# Patient Record
Sex: Female | Born: 1975
Health system: Southern US, Community
[De-identification: ages and names within clinical notes are randomized; demographics above are authoritative.]

## PROBLEM LIST (undated history)

## (undated) ENCOUNTER — Inpatient Hospital Stay (HOSPITAL_COMMUNITY): Payer: Self-pay

## (undated) DIAGNOSIS — K5090 Crohn's disease, unspecified, without complications: Secondary | ICD-10-CM

## (undated) DIAGNOSIS — M26629 Arthralgia of temporomandibular joint, unspecified side: Secondary | ICD-10-CM

## (undated) DIAGNOSIS — N939 Abnormal uterine and vaginal bleeding, unspecified: Secondary | ICD-10-CM

## (undated) DIAGNOSIS — D563 Thalassemia minor: Secondary | ICD-10-CM

## (undated) DIAGNOSIS — N9489 Other specified conditions associated with female genital organs and menstrual cycle: Secondary | ICD-10-CM

## (undated) DIAGNOSIS — O368130 Decreased fetal movements, third trimester, not applicable or unspecified: Secondary | ICD-10-CM

## (undated) DIAGNOSIS — Z973 Presence of spectacles and contact lenses: Secondary | ICD-10-CM

## (undated) DIAGNOSIS — F411 Generalized anxiety disorder: Secondary | ICD-10-CM

## (undated) DIAGNOSIS — F419 Anxiety disorder, unspecified: Secondary | ICD-10-CM

## (undated) DIAGNOSIS — K219 Gastro-esophageal reflux disease without esophagitis: Secondary | ICD-10-CM

## (undated) DIAGNOSIS — K30 Functional dyspepsia: Secondary | ICD-10-CM

## (undated) DIAGNOSIS — A609 Anogenital herpesviral infection, unspecified: Secondary | ICD-10-CM

## (undated) DIAGNOSIS — J3501 Chronic tonsillitis: Secondary | ICD-10-CM

## (undated) HISTORY — DX: Anogenital herpesviral infection, unspecified: A60.9

## (undated) HISTORY — PX: COLON RESECTION: SHX5231

## (undated) HISTORY — PX: COLONOSCOPY WITH PROPOFOL: SHX5780

## (undated) HISTORY — PX: WISDOM TOOTH EXTRACTION: SHX21

## (undated) HISTORY — PX: APPENDECTOMY: SHX54

## (undated) HISTORY — DX: Crohn's disease, unspecified, without complications: K50.90

## (undated) SURGERY — Surgical Case
Anesthesia: *Unknown

---

## 2001-01-23 ENCOUNTER — Other Ambulatory Visit: Admission: RE | Admit: 2001-01-23 | Discharge: 2001-01-23 | Payer: Self-pay | Admitting: Obstetrics and Gynecology

## 2005-07-05 ENCOUNTER — Encounter: Payer: Self-pay | Admitting: Gastroenterology

## 2005-07-05 ENCOUNTER — Encounter: Payer: Self-pay | Admitting: Internal Medicine

## 2007-07-17 ENCOUNTER — Encounter: Payer: Self-pay | Admitting: Gastroenterology

## 2007-09-22 ENCOUNTER — Ambulatory Visit: Payer: Self-pay | Admitting: Gastroenterology

## 2007-09-22 DIAGNOSIS — K5090 Crohn's disease, unspecified, without complications: Secondary | ICD-10-CM | POA: Insufficient documentation

## 2009-12-04 ENCOUNTER — Encounter: Payer: Self-pay | Admitting: Gastroenterology

## 2010-02-15 ENCOUNTER — Ambulatory Visit: Payer: Self-pay | Admitting: Gastroenterology

## 2010-04-16 ENCOUNTER — Telehealth (INDEPENDENT_AMBULATORY_CARE_PROVIDER_SITE_OTHER): Payer: Self-pay | Admitting: *Deleted

## 2010-04-18 ENCOUNTER — Ambulatory Visit: Payer: Self-pay | Admitting: Gastroenterology

## 2010-04-25 ENCOUNTER — Encounter (INDEPENDENT_AMBULATORY_CARE_PROVIDER_SITE_OTHER): Payer: Self-pay | Admitting: *Deleted

## 2010-04-27 ENCOUNTER — Ambulatory Visit: Payer: Self-pay | Admitting: Gastroenterology

## 2010-05-09 ENCOUNTER — Encounter: Payer: Self-pay | Admitting: Gastroenterology

## 2010-05-09 ENCOUNTER — Ambulatory Visit
Admission: RE | Admit: 2010-05-09 | Discharge: 2010-05-09 | Payer: Self-pay | Source: Home / Self Care | Attending: Gastroenterology | Admitting: Gastroenterology

## 2010-05-15 ENCOUNTER — Encounter: Payer: Self-pay | Admitting: Gastroenterology

## 2010-05-31 NOTE — Procedures (Addendum)
Summary: Colonoscopy  Patient: [name] Note: All result statuses are Final unless otherwise noted.  Tests: (1) Colonoscopy (COL)   COL Colonoscopy           [facility].     [place], [place]  [address]           COLONOSCOPY PROCEDURE REPORT           PATIENT:  Susan Montoya, Susan Montoya  MR#:  [id number]     BIRTHDATE:  [birth year], 34 yrs. old  GENDER:  female           ENDOSCOPIST:  Sandy Salaam. Deatra Ina, MD     Referred by:           PROCEDURE DATE:  05/09/2010     PROCEDURE:  Colonoscopy with biopsy     ASA CLASS:  Class II     INDICATIONS:  1) Crohn's disease           MEDICATIONS:   MAC sedation, administered by CRNA           DESCRIPTION OF PROCEDURE:   After the risks benefits and     alternatives of the procedure were thoroughly explained, informed     consent was obtained.  Digital rectal exam was performed and     revealed no abnormalities.   The LB CF-H180AL [id number] endoscope     was introduced through the anus and advanced to the cecum, which     was identified by the ileocecal valve, without limitations.  The     quality of the prep was excellent, using MoviPrep.  The instrument     was then slowly withdrawn as the colon was fully examined.     <<PROCEDUREIMAGES>>           FINDINGS:  Colitis was found in the rectum. Very mild erythema     with areas of submucosal hemmorhage. Bxs taken (see image9 and     image10).  This was otherwise a normal examination of the colon     (see image1, image2, image3, image4, image5, image7, and image8).     Retroflexed views in the rectum revealed no abnormalities.    The     time to cecum =  3.75  minutes. The scope was then withdrawn (time     =  4.75  min) from the patient and the procedure completed.           COMPLICATIONS:  None           ENDOSCOPIC IMPRESSION:     1) Colitis in the rectum (minimally active disease)     2) Otherwise normal examination     RECOMMENDATIONS:     1) Continue current  medications           REPEAT EXAM:  No           ______________________________     Sandy Salaam. Deatra Ina, MD           CC:           n.     eSIGNED:   Sandy Salaam. Rachid Parham at 05/09/2010 02:30 PM           Jossie Ng, [id number]  Note: An exclamation mark (!) indicates a result that was not dispersed into the flowsheet. Document Creation Date: 05/09/2010 2:30 PM _______________________________________________________________________  (1) Order result status: Final Collection or observation date-time: 05/09/2010 14:21 Requested date-time:  Receipt  date-time:  Reported date-time:  Referring Physician:   Ordering Physician: Erskine Emery [phone]) Specimen Source:  Source: Tawanna Cooler Order Number: [id number] Lab site:

## 2010-05-31 NOTE — Progress Notes (Signed)
Summary: Wonders if she should change colon 12-21  Phone Note Call from Patient Call back at [facility] Phone [phone]   Call For: Dr Deatra Ina Reason for Call: Talk to Nurse Summary of Call: Over the weekend ate peas & brown rice and is pretty sure she should change her procedure date - 04-18-10 Initial call taken by: Irwin Brakeman [facility],  April 16, 2010 8:15 AM  Follow-up for Phone Call        Diet restrictions reviewed with patient again. Pt okay to have procedure as planned Follow-up by: Ulice Dash RN,  April 16, 2010 10:11 AM

## 2010-05-31 NOTE — Letter (Signed)
Summary: [facility] Instructions  [facility]  [place], [place] [address]   Phone: [phone]  Fax: [phone]       Susan Montoya    [birth year]    MRN: [id number]        Procedure Day Sudie Grumbling:  Wednesday 05/09/2010     Arrival Time:  1:00 pm      Procedure Time:  2:00 pm     Location of Procedure:                    _x _  [facility] (4th Floor)                        [place]   Starting 5 days prior to your procedure Friday 1/6 do not eat nuts, seeds, popcorn, corn, beans, peas,  salads, or any raw vegetables.  Do not take any fiber supplements (e.g. Metamucil, Citrucel, and Benefiber).  THE DAY BEFORE YOUR PROCEDURE         DATE: Tuesday 1/10  1.  Drink clear liquids the entire day-NO SOLID FOOD  2.  Do not drink anything colored red or purple.  Avoid juices with pulp.  No orange juice.  3.  Drink at least 64 oz. (8 glasses) of fluid/clear liquids during the day to prevent dehydration and help the prep work efficiently.  CLEAR LIQUIDS INCLUDE: Water Jello Ice Popsicles Tea (sugar ok, no milk/cream) Powdered fruit flavored drinks Coffee (sugar ok, no milk/cream) Gatorade Juice: apple, white grape, white cranberry  Lemonade Clear bullion, consomm, broth Carbonated beverages (any kind) Strained chicken noodle soup Hard Candy                             4.  In the morning, mix first dose of MoviPrep solution:    Empty 1 Pouch A and 1 Pouch B into the disposable container    Add lukewarm drinking water to the top line of the container. Mix to dissolve    Refrigerate (mixed solution should be used within 24 hrs)  5.  Begin drinking the prep at 5:00 p.m. The MoviPrep container is divided by 4 marks.   Every 15 minutes drink the solution down to the next mark (approximately 8 oz) until the full liter is complete.   6.  Follow completed prep with 16 oz of clear liquid of your choice (Nothing  red or purple).  Continue to drink clear liquids until bedtime.  7.  Before going to bed, mix second dose of MoviPrep solution:    Empty 1 Pouch A and 1 Pouch B into the disposable container    Add lukewarm drinking water to the top line of the container. Mix to dissolve    Refrigerate  THE DAY OF YOUR PROCEDURE      DATE: Wednesday 1/11  Beginning at 9:00 a.m. (5 hours before procedure):         1. Every 15 minutes, drink the solution down to the next mark (approx 8 oz) until the full liter is complete.  2. Follow completed prep with 16 oz. of clear liquid of your choice.    3. You may drink clear liquids until 12:00 pm  (2 HOURS BEFORE PROCEDURE).   MEDICATION INSTRUCTIONS  Unless otherwise instructed, you should take regular prescription medications with a small sip of water   as early as possible  the morning of your procedure.           OTHER INSTRUCTIONS  You will need a responsible adult at least 35 years of age to accompany you and drive you home.   This person must remain in the waiting room during your procedure.  Wear loose fitting clothing that is easily removed.  Leave jewelry and other valuables at home.  However, you may wish to bring a book to read or  an iPod/MP3 player to listen to music as you wait for your procedure to start.  Remove all body piercing jewelry and leave at home.  Total time from sign-in until discharge is approximately 2-3 hours.  You should go home directly after your procedure and rest.  You can resume normal activities the  day after your procedure.  The day of your procedure you should not:   Drive   Make legal decisions   Operate machinery   Drink alcohol   Return to work  You will receive specific instructions about eating, activities and medications before you leave.    The above instructions have been reviewed and explained to me by   Ulice Dash RN  April 26, 2010 10:07 AM     I fully understand and  can verbalize these instructions _____________________________ Date _________

## 2010-05-31 NOTE — Procedures (Signed)
Summary: Colonoscopy  Patient: Susan Montoya Note: All result statuses are Final unless otherwise noted.  Tests: (1) Colonoscopy (COL)   COL Colonoscopy           [facility].     [place], [place]  [address]           COLONOSCOPY PROCEDURE REPORT           PATIENT:  Susan Montoya, Susan Montoya  MR#:  [id number]     BIRTHDATE:  03-Dec-1975, 34 yrs. old  GENDER:  female           ENDOSCOPIST:  Sandy Salaam. Deatra Ina, MD     Referred by:           PROCEDURE DATE:  04/18/2010     PROCEDURE:  Incomplete colonoscopy     ASA CLASS:  Class II     INDICATIONS:  1) Crohn's disease           MEDICATIONS:   Benadryl 37.5 mg IV, Versed 8 mg IV, Fentanyl 100     mcg IV           DESCRIPTION OF PROCEDURE:   After the risks benefits and     alternatives of the procedure were thoroughly explained, informed     consent was obtained.  Digital rectal exam was performed and     revealed no abnormalities.   The LB PCF-H180AL [id number] endoscope     was introduced through the anus and advanced to the descending     colon, limited by extreme patient discomfort.    The quality of     the prep was excellent, using MoviPrep.  The instrument was then     slowly withdrawn as the colon was fully examined.           FINDINGS:  Normal left colon    Retroflexed views in the rectum     revealed no abnormalities.     The scope was then withdrawn  from     the patient and the procedure completed.           COMPLICATIONS:  None           ENDOSCOPIC IMPRESSION:     1) Normal     RECOMMENDATIONS:     1) Sedation with MAC for future procedures     2) RepeatColonoscopy           ______________________________     Sandy Salaam. Deatra Ina, MD           CC:           n.     eSIGNED:   Sandy Salaam. Kaplan at 04/18/2010 08:34 AM           Jossie Ng, [id number]  Note: An exclamation mark (!) indicates a result that was not dispersed into the flowsheet. Document Creation Date: 04/18/2010 8:34  AM _______________________________________________________________________  (1) Order result status: Final Collection or observation date-time: 04/18/2010 08:24 Requested date-time:  Receipt date-time:  Reported date-time:  Referring Physician:   Ordering Physician: Erskine Emery [phone]) Specimen Source:  Source: Tawanna Cooler Order Number: [id number] Lab site:   Appended Document: Colonoscopy PREVIST SCHEDULED FOR 12/30 AT 1:30PM AND REPEAT COLON W/PROPOFUL ON 1/11 AT 2PM

## 2010-05-31 NOTE — Letter (Signed)
Summary: Results Letter  [facility]  [place], [place] [address]   Phone: [phone]  Fax: [phone]        May 15, 2010 MRN: [id number]    [place], [place]  [address]    Dear Ms. Nori Riis,  Your colon biopsy results did not show any remarkable findings.  Please continue with the recommendations previously discussed.  Should you have any further questions or immediate concers, feel free to contact me.  Sincerely,  Sandy Salaam. Deatra Ina, M.D., [facility]         Sincerely,  Inda Castle MD  This letter has been electronically signed by your physician.  Appended Document: Results Letter Letter mailed

## 2010-05-31 NOTE — Letter (Signed)
Summary: [facility]   Imported By: Phillis Knack 03/06/2010 07:40:45  _____________________________________________________________________  External Attachment:    Type:   Image     Comment:   External Document

## 2010-05-31 NOTE — Assessment & Plan Note (Signed)
Summary: blood in stool//discuss COL--ch.   History of Present Illness Visit Type: Follow-up Visit Primary GI MD: Erskine Emery MD [facility] Primary Provider: n/a Requesting Provider: n/a Chief Complaint: Hx of Crohn's and recent blood in stool associated with a virus History of Present Illness:   Ms Susan Montoya has returned to setup colonoscopy.  She is status post right hemicolectomy and resection of the terminal ileum for Crohn's disease, in 2007.  She has been symptom-free.    Altogether she is feeling well.  Colonoscopy was recommended at her last visit to see whether she has any active disease.   GI Review of Systems      Denies abdominal pain, acid reflux, belching, bloating, chest pain, dysphagia with liquids, dysphagia with solids, heartburn, loss of appetite, nausea, vomiting, vomiting blood, weight loss, and  weight gain.        Denies anal fissure, black tarry stools, change in bowel habit, constipation, diarrhea, diverticulosis, fecal incontinence, heme positive stool, hemorrhoids, irritable bowel syndrome, jaundice, light color stool, liver problems, rectal bleeding, and  rectal pain. Preventive Screening-Counseling & Management  Alcohol-Tobacco     Smoking Status: never  Caffeine-Diet-Exercise     Does Patient Exercise: yes      Drug Use:  no.      Current Medications (verified): 1)  Loestrin Fe 1.5/30 1.5-30 Mg-Mcg  Tabs (Norethin Ace-Eth Estrad-Fe) .... As Directed 2)  Alprazolam 0.5 Mg Tabs (Alprazolam) .... Use As Needed  Allergies (verified): No Known Drug Allergies  Past History:  Past Medical History: CROHN'S DISEASE  Past Surgical History: Reviewed history from 09/22/2007 and no changes required. Appendectomy 7/07 Six inches of small and large intestine removed 7/07  Family History: Family history is negative for inflammatory bowel disease. 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  Alcohol Use - yes--occ Illicit Drug Use - no Patient gets regular exercise. Run 15-20 miles/week Smoking Status:  never Drug Use:  no Does Patient Exercise:  yes  Review of Systems  The patient denies allergy/sinus, anemia, anxiety-new, arthritis/joint pain, back pain, blood in urine, breast changes/lumps, confusion, cough, coughing up blood, depression-new, fainting, fatigue, fever, headaches-new, hearing problems, heart murmur, heart rhythm changes, itching, menstrual pain, muscle pains/cramps, night sweats, nosebleeds, pregnancy symptoms, shortness of breath, skin rash, sleeping problems, sore throat, swelling of feet/legs, swollen lymph glands, thirst - excessive, urination - excessive, urination changes/pain, urine leakage, vision changes, and voice change.    Vital Signs:  Patient profile:   35 year old female Height:      63 inches Weight:      113 pounds BMI:     20.09 Pulse rate:   52 / minute Pulse rhythm:   regular BP sitting:   110 / 72  (left arm) Cuff size:   regular  Vitals Entered By: Abelino Derrick CMA Deborra Medina) (February 15, 2010 8:44 AM)  Physical Exam  Additional Exam:  On physical exam she is a well-developed large female  skin: anicteric HEENT: normocephalic; PEERLA; no nasal or pharyngeal abnormalities neck: supple nodes: no cervical lymphadenopathy chest: clear to ausculatation and percussion heart: no murmurs, gallops, or rubs abd: soft, nontender; BS normoactive; no abdominal masses, tenderness, organomegaly rectal: deferred ext: no cynanosis, clubbing, edema skeletal: no deformities neuro: oriented x 3; no focal abnormalities    Impression & Recommendations:  Problem # 1:  CROHN'S DISEASE (ICD-555.9)  status post right hemicolectomy and resection of the terminal ileum in 2007.  Currently asymptomatic.  Recommendations #1  colonoscopy  Orders: Colonoscopy (Colon)  Patient Instructions: 1)  Colonoscopy and Flexible Sigmoidoscopy brochure given.  2)  Conscious Sedation brochure given.  3)  Your Colonoscopy is scheduled on 04/18/2010 at 8am 4)  The medication list was reviewed and reconciled.  All changed / newly prescribed medications were explained.  A complete medication list was provided to the patient / caregiver. Prescriptions: MOVIPREP 100 GM  SOLR (PEG-KCL-NACL-NASULF-NA ASC-C) As per prep instructions.  #1 x 0   Entered by:   Genella Mech CMA (AAMA)   Authorized by:   Inda Castle MD   Signed by:   Genella Mech CMA ([place]) on 02/15/2010   Method used:   Print then Give to Patient   RxID:   [id number] MOVIPREP 100 GM  SOLR (PEG-KCL-NACL-NASULF-NA ASC-C) As per prep instructions.  #1 x 0   Entered by:   Genella Mech CMA ([place])   Authorized by:   Inda Castle MD   Signed by:   Genella Mech CMA ([place]) on 02/15/2010   Method used:   Electronically to        [facility] # [phone]* (retail)       [address]       [place], [place]  [address]       Ph: [phone]       Fax: [phone]   RxID:   [id number]  [address] [place]

## 2010-05-31 NOTE — Letter (Signed)
Summary: [facility] Instructions  [facility]  [place], [place] [address]   Phone: [phone]  Fax: [phone]       Susan Montoya    [birth year]    MRN: [id number]        Procedure Day /Date:WEDNESDAY 04/18/2010     Arrival Time:7:30AM     Procedure Time:8:00AM     Location of Procedure:                    X  [facility] (4th Floor)   PREPARATION FOR COLONOSCOPY WITH MOVIPREP   Starting 5 days prior to your procedure 04/13/2010 do not eat nuts, seeds, popcorn, corn, beans, peas,  salads, or any raw vegetables.  Do not take any fiber supplements (e.g. Metamucil, Citrucel, and Benefiber).  THE DAY BEFORE YOUR PROCEDURE         DATE:04/17/2010  DAY: TUESDAY  1.  Drink clear liquids the entire day-NO SOLID FOOD  2.  Do not drink anything colored red or purple.  Avoid juices with pulp.  No orange juice.  3.  Drink at least 64 oz. (8 glasses) of fluid/clear liquids during the day to prevent dehydration and help the prep work efficiently.  CLEAR LIQUIDS INCLUDE: Water Jello Ice Popsicles Tea (sugar ok, no milk/cream) Powdered fruit flavored drinks Coffee (sugar ok, no milk/cream) Gatorade Juice: apple, white grape, white cranberry  Lemonade Clear bullion, consomm, broth Carbonated beverages (any kind) Strained chicken noodle soup Hard Candy                             4.  In the morning, mix first dose of MoviPrep solution:    Empty 1 Pouch A and 1 Pouch B into the disposable container    Add lukewarm drinking water to the top line of the container. Mix to dissolve    Refrigerate (mixed solution should be used within 24 hrs)  5.  Begin drinking the prep at 5:00 p.m. The MoviPrep container is divided by 4 marks.   Every 15 minutes drink the solution down to the next mark (approximately 8 oz) until the full liter is complete.   6.  Follow completed prep with 16 oz of clear liquid of your choice (Nothing red or purple).   Continue to drink clear liquids until bedtime.  7.  Before going to bed, mix second dose of MoviPrep solution:    Empty 1 Pouch A and 1 Pouch B into the disposable container    Add lukewarm drinking water to the top line of the container. Mix to dissolve    Refrigerate  THE DAY OF YOUR PROCEDURE      DATE:04/18/2010 DAY: WEDNESDAY  Beginning at 3 a.m. (5 hours before procedure):         1. Every 15 minutes, drink the solution down to the next mark (approx 8 oz) until the full liter is complete.  2. Follow completed prep with 16 oz. of clear liquid of your choice.    3. You may drink clear liquids until 6AM (2 HOURS BEFORE PROCEDURE).   MEDICATION INSTRUCTIONS  Unless otherwise instructed, you should take regular prescription medications with a small sip of water   as early as possible the morning of your procedure.          OTHER INSTRUCTIONS  You will need a responsible adult at least 35 years of age to accompany you and  drive you home.   This person must remain in the waiting room during your procedure.  Wear loose fitting clothing that is easily removed.  Leave jewelry and other valuables at home.  However, you may wish to bring a book to read or  an iPod/MP3 player to listen to music as you wait for your procedure to start.  Remove all body piercing jewelry and leave at home.  Total time from sign-in until discharge is approximately 2-3 hours.  You should go home directly after your procedure and rest.  You can resume normal activities the  day after your procedure.  The day of your procedure you should not:   Drive   Make legal decisions   Operate machinery   Drink alcohol   Return to work  You will receive specific instructions about eating, activities and medications before you leave.    The above instructions have been reviewed and explained to me by   _______________________    I fully understand and can verbalize these instructions  _____________________________ Date _________

## 2010-05-31 NOTE — Miscellaneous (Signed)
Summary: [facility]  Clinical Lists Changes  Medications: Added new medication of MOVIPREP 100 GM  SOLR (PEG-KCL-NACL-NASULF-NA ASC-C) As per prep instructions. - Signed Rx of MOVIPREP 100 GM  SOLR (PEG-KCL-NACL-NASULF-NA ASC-C) As per prep instructions.;  #1 x 0;  Signed;  Entered by: Ulice Dash RN;  Authorized by: Inda Castle MD;  Method used: Electronically to [facility]  [phone]*, [address] [place], [place]  [address], Ph: [phone] or [phone], Fax: [phone] Observations: Added new observation of NKA: T (04/26/2010 9:48)    Prescriptions: MOVIPREP 100 GM  SOLR (PEG-KCL-NACL-NASULF-NA ASC-C) As per prep instructions.  #1 x 0   Entered by:   Ulice Dash RN   Authorized by:   Inda Castle MD   Signed by:   Ulice Dash RN on 04/26/2010   Method used:   Electronically to        [facility]  [phone]* (retail)       [place], [place]  [address]       Ph: [phone] or [phone]       Fax: [phone]   RxID:   [id number]

## 2010-07-29 ENCOUNTER — Emergency Department (HOSPITAL_COMMUNITY)
Admission: EM | Admit: 2010-07-29 | Discharge: 2010-07-29 | Disposition: A | Discharge status: Home / Self Care | Payer: No Typology Code available for payment source | Attending: Emergency Medicine | Admitting: Emergency Medicine

## 2010-07-29 DIAGNOSIS — M25519 Pain in unspecified shoulder: Secondary | ICD-10-CM | POA: Insufficient documentation

## 2010-07-29 DIAGNOSIS — T148XXA Other injury of unspecified body region, initial encounter: Secondary | ICD-10-CM | POA: Insufficient documentation

## 2010-07-29 DIAGNOSIS — M549 Dorsalgia, unspecified: Secondary | ICD-10-CM | POA: Insufficient documentation

## 2011-08-20 ENCOUNTER — Ambulatory Visit (INDEPENDENT_AMBULATORY_CARE_PROVIDER_SITE_OTHER): Payer: No Typology Code available for payment source | Admitting: Family

## 2011-08-20 ENCOUNTER — Encounter: Payer: Self-pay | Admitting: Family

## 2011-08-20 VITALS — BP 100/70 | Ht 63.0 in | Wt 124.0 lb

## 2011-08-20 DIAGNOSIS — K5090 Crohn's disease, unspecified, without complications: Secondary | ICD-10-CM

## 2011-08-20 DIAGNOSIS — D569 Thalassemia, unspecified: Secondary | ICD-10-CM

## 2011-08-20 DIAGNOSIS — Z111 Encounter for screening for respiratory tuberculosis: Secondary | ICD-10-CM

## 2011-08-20 NOTE — Progress Notes (Signed)
  Subjective:    Patient ID: Susan Montoya, female    DOB: [birth year], 36 y.o.   MRN: [id number]  HPI 36 year old female, new patient to the practice here to be established. She has a history of thalassemia and Crohn's disease. They have both well managed and under control. Denies any concerns today. She is requiring a PPD for a new job. Denies any lightheadedness, dizziness, chest pain, palpitations, shortness of breath or edema.  Patient has a family history of breast cancer her mother, recently diagnosed at age 31. And a paternal grandmother of unknown age.   Review of Systems  Constitutional: Negative.   Respiratory: Negative.   Cardiovascular: Negative.   Gastrointestinal: Negative.   Genitourinary: Negative.   Musculoskeletal: Negative.   Skin: Negative.   Neurological: Negative.   Hematological: Negative.   Psychiatric/Behavioral: Negative.    Past Medical History  Diagnosis Date  . Crohn's disease     History   Social History  . Marital Status: Married    Spouse Name: N/A    Number of Children: N/A  . Years of Education: N/A   Occupational History  . Not on file.   Social History Main Topics  . Smoking status: Never Smoker   . Smokeless tobacco: Not on file  . Alcohol Use: Yes  . Drug Use: No  . Sexually Active:    Other Topics Concern  . Not on file   Social History Narrative  . No narrative on file    Past Surgical History  Procedure Date  . Appendectomy   . Small intestine surgery     1 ft removed    Family History  Problem Relation Age of Onset  . Cancer Mother     breast  . Hyperlipidemia Mother   . Hypertension Mother   . Hypertension Father   . Hypertension Brother   . Hypertension Maternal Grandmother   . Hyperlipidemia Maternal Grandfather   . Hypertension Maternal Grandfather   . Diabetes Paternal Grandmother   . Cancer Paternal Grandmother     breast  . Diabetes Paternal Grandfather     No Known Allergies  Current Outpatient  Prescriptions on File Prior to Visit  Medication Sig Dispense Refill  . norethindrone-ethinyl estradiol (JUNEL FE,GILDESS FE,LOESTRIN FE) 1-20 MG-MCG tablet Take 1 tablet by mouth daily.        BP 100/70  Ht 5' 3"  (1.6 m)  Wt 124 lb (56.246 kg)  BMI 21.97 kg/m2chart     Objective:   Physical Exam  Constitutional: She is oriented to person, place, and time. She appears well-developed and well-nourished.  HENT:  Right Ear: External ear normal.  Left Ear: External ear normal.  Nose: Nose normal.  Mouth/Throat: Oropharynx is clear and moist.  Neck: Normal range of motion. Neck supple.  Cardiovascular: Normal rate and normal heart sounds.   Pulmonary/Chest: Effort normal.  Abdominal: Soft. Bowel sounds are normal.  Musculoskeletal: Normal range of motion.  Neurological: She is alert and oriented to person, place, and time.  Skin: Skin is dry.  Psychiatric: She has a normal mood and affect.          Assessment & Plan:  Assessment: Need for PPD, thalassemia, Crohn's disease  Plan: Mammogram ordered. She is currently seeing gynecology for fasting blood work and gynecological care. PPD applied. Patient will return in 48-72 hours to have a red.

## 2011-08-22 LAB — TB SKIN TEST: TB Skin Test: NEGATIVE mm

## 2011-09-18 ENCOUNTER — Other Ambulatory Visit: Payer: Self-pay

## 2011-09-18 DIAGNOSIS — Z1231 Encounter for screening mammogram for malignant neoplasm of breast: Secondary | ICD-10-CM

## 2011-10-01 ENCOUNTER — Ambulatory Visit
Admission: RE | Admit: 2011-10-01 | Discharge: 2011-10-01 | Disposition: A | Discharge status: Home / Self Care | Payer: No Typology Code available for payment source | Source: Ambulatory Visit | Attending: Family | Admitting: Family

## 2011-10-01 DIAGNOSIS — Z1231 Encounter for screening mammogram for malignant neoplasm of breast: Secondary | ICD-10-CM

## 2011-11-18 LAB — OB RESULTS CONSOLE ANTIBODY SCREEN: Antibody Screen: NEGATIVE

## 2011-11-18 LAB — OB RESULTS CONSOLE HIV ANTIBODY (ROUTINE TESTING): HIV: NONREACTIVE

## 2011-11-18 LAB — OB RESULTS CONSOLE GC/CHLAMYDIA: Gonorrhea: NEGATIVE

## 2012-03-01 ENCOUNTER — Inpatient Hospital Stay (HOSPITAL_COMMUNITY)
Admission: AD | Admit: 2012-03-01 | Discharge: 2012-03-05 | DRG: 781 | Disposition: A | Discharge status: Home / Self Care | Payer: 59 | Source: Ambulatory Visit | Attending: Obstetrics | Admitting: Obstetrics

## 2012-03-01 ENCOUNTER — Inpatient Hospital Stay (HOSPITAL_COMMUNITY): Payer: 59

## 2012-03-01 ENCOUNTER — Encounter (HOSPITAL_COMMUNITY): Payer: Self-pay | Admitting: *Deleted

## 2012-03-01 DIAGNOSIS — O99891 Other specified diseases and conditions complicating pregnancy: Secondary | ICD-10-CM | POA: Diagnosis present

## 2012-03-01 DIAGNOSIS — O4690 Antepartum hemorrhage, unspecified, unspecified trimester: Principal | ICD-10-CM | POA: Diagnosis present

## 2012-03-01 HISTORY — DX: Thalassemia minor: D56.3

## 2012-03-01 LAB — CBC
HCT: 28 % — ABNORMAL LOW (ref 36.0–46.0)
Hemoglobin: 9 g/dL — ABNORMAL LOW (ref 12.0–15.0)
MCH: 21.1 pg — ABNORMAL LOW (ref 26.0–34.0)
MCHC: 32.1 g/dL (ref 30.0–36.0)
MCV: 65.6 fL — ABNORMAL LOW (ref 78.0–100.0)
Platelets: 254 K/uL (ref 150–400)
RBC: 4.27 MIL/uL (ref 3.87–5.11)
RDW: 15.5 % (ref 11.5–15.5)
WBC: 12.4 K/uL — ABNORMAL HIGH (ref 4.0–10.5)

## 2012-03-01 LAB — URINALYSIS, ROUTINE W REFLEX MICROSCOPIC
Bilirubin Urine: NEGATIVE
Ketones, ur: NEGATIVE mg/dL
Nitrite: NEGATIVE
Protein, ur: NEGATIVE mg/dL
Specific Gravity, Urine: <1.005 — ABNORMAL LOW (ref 1.005–1.030)
Urobilinogen, UA: 0.2 mg/dL (ref 0.0–1.0)

## 2012-03-01 LAB — URINE MICROSCOPIC-ADD ON

## 2012-03-01 LAB — ABO/RH: ABO/RH(D): O POS

## 2012-03-01 MED ORDER — BETAMETHASONE SOD PHOS & ACET 6 (3-3) MG/ML IJ SUSP
12.0000 mg | INTRAMUSCULAR | Status: AC
  Administered 2012-03-01 – 2012-03-02 (×2): 12 mg via INTRAMUSCULAR
  Filled 2012-03-01 (×2): qty 2

## 2012-03-01 MED ORDER — DOCUSATE SODIUM 100 MG PO CAPS
100.0000 mg | ORAL_CAPSULE | Freq: Every day | ORAL | Status: DC
  Administered 2012-03-03: 100 mg via ORAL
  Filled 2012-03-01 (×3): qty 1

## 2012-03-01 MED ORDER — CALCIUM CARBONATE ANTACID 500 MG PO CHEW
2.0000 | CHEWABLE_TABLET | ORAL | Status: DC | PRN
  Administered 2012-03-01: 400 mg via ORAL

## 2012-03-01 MED ORDER — SODIUM CHLORIDE 0.9 % IJ SOLN
3.0000 mL | Freq: Two times a day (BID) | INTRAMUSCULAR | Status: DC
  Administered 2012-03-03 – 2012-03-04 (×4): 3 mL via INTRAVENOUS

## 2012-03-01 MED ORDER — PANTOPRAZOLE SODIUM 40 MG PO TBEC
40.0000 mg | DELAYED_RELEASE_TABLET | Freq: Every day | ORAL | Status: DC
  Administered 2012-03-01 – 2012-03-05 (×5): 40 mg via ORAL
  Filled 2012-03-01 (×6): qty 1

## 2012-03-01 MED ORDER — SODIUM CHLORIDE 0.9 % IJ SOLN: 3.0000 mL | INTRAMUSCULAR | Status: DC | PRN

## 2012-03-01 MED ORDER — ZOLPIDEM TARTRATE 5 MG PO TABS: 5.0000 mg | ORAL_TABLET | Freq: Every evening | ORAL | Status: DC | PRN

## 2012-03-01 MED ORDER — CALCIUM CARBONATE ANTACID 500 MG PO CHEW
2.0000 | CHEWABLE_TABLET | ORAL | Status: DC | PRN
  Filled 2012-03-01 (×2): qty 2

## 2012-03-01 MED ORDER — AZITHROMYCIN 500 MG PO TABS
500.0000 mg | ORAL_TABLET | Freq: Every day | ORAL | Status: DC
  Administered 2012-03-01 – 2012-03-05 (×5): 500 mg via ORAL
  Filled 2012-03-01 (×6): qty 1

## 2012-03-01 MED ORDER — RISAQUAD PO CAPS
1.0000 | ORAL_CAPSULE | Freq: Every day | ORAL | Status: DC
Start: 2012-03-01 — End: 2012-03-05
  Administered 2012-03-01 – 2012-03-05 (×5): 1 via ORAL
  Filled 2012-03-01 (×6): qty 1

## 2012-03-01 MED ORDER — PRENATAL MULTIVITAMIN CH: 1.0000 | ORAL_TABLET | Freq: Every day | ORAL | Status: DC

## 2012-03-01 MED ORDER — SODIUM CHLORIDE 0.9 % IV SOLN: 250.0000 mL | INTRAVENOUS | Status: DC | PRN

## 2012-03-01 MED ORDER — AMPICILLIN SODIUM 2 G IJ SOLR
2.0000 g | Freq: Four times a day (QID) | INTRAMUSCULAR | Status: AC
  Administered 2012-03-01 – 2012-03-03 (×8): 2 g via INTRAVENOUS
  Filled 2012-03-01 (×8): qty 2000

## 2012-03-01 MED ORDER — AMOXICILLIN 500 MG PO CAPS
500.0000 mg | ORAL_CAPSULE | Freq: Three times a day (TID) | ORAL | Status: DC
  Administered 2012-03-03 – 2012-03-05 (×7): 500 mg via ORAL
  Filled 2012-03-01 (×10): qty 1

## 2012-03-01 MED ORDER — PRENATAL MULTIVITAMIN CH
1.0000 | ORAL_TABLET | Freq: Every day | ORAL | Status: DC
  Administered 2012-03-03 – 2012-03-05 (×3): 1 via ORAL
  Filled 2012-03-01 (×5): qty 1

## 2012-03-01 MED ORDER — ACETAMINOPHEN 325 MG PO TABS: 650.0000 mg | ORAL_TABLET | ORAL | Status: DC | PRN

## 2012-03-01 NOTE — MAU Provider Note (Signed)
History     CSN: [id number]  Arrival date and time: 03/01/12 4287    Provider notified at [facility] Patient seen at [facility]  Patient presents with  . Rupture of Membranes  . Vaginal Bleeding   HPI Susan Montoya is a 36 y.o. G2P1001 at [redacted]w[redacted]d with good PNC at [facility], Dr, [facility]. Reports episode of intercourse at 5 am today, small spotting noted afterwards. An hour later while driving to work she experienced a gush of bloody fluid which soaked through her clothes and onto seat of car.  Denies contractions, good fetal movement, denies dysuria / pelvic pain / frequency.  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 Crohn's disease     Past Surgical History  Procedure Date  . Appendectomy   . Small intestine surgery     1 ft removed    Family History  Problem Relation Age of Onset  . Cancer Mother     breast  . Hyperlipidemia Mother   . Hypertension Mother   . Hypertension Father   . Hypertension Brother   . Hypertension Maternal Grandmother   . Hyperlipidemia Maternal Grandfather   . Hypertension Maternal Grandfather   . Diabetes Paternal Grandmother   . Cancer Paternal Grandmother     breast  . Diabetes Paternal Grandfather     History  Substance Use Topics  . Smoking status: Never Smoker   . Smokeless tobacco: Not on file  . Alcohol Use: Yes    Allergies: No Known Allergies  Prescriptions prior to admission  Medication Sig Dispense Refill  . ALPRAZolam (XANAX) 0.5 MG tablet Take 0.5 mg by mouth as needed.      . Multiple Vitamin (MULTIVITAMIN) tablet Take 1 tablet by mouth daily.      . norethindrone-ethinyl estradiol (JUNEL FE,GILDESS FE,LOESTRIN FE) 1-20 MG-MCG tablet Take 1 tablet by mouth daily.        Review of Systems    Gastrointestinal: Negative for abdominal pain.  All other systems reviewed and are negative.   Physical Exam   Blood pressure 120/66, pulse 93, temperature 97.8 F (36.6 C), resp. rate 20, height 5' 3"  (1.6 m), weight 60.963 kg (134 lb 6.4 oz), last menstrual period 09/25/2011.  Physical Exam  Constitutional: She is oriented to person, place, and time. She appears well-developed and well-nourished. No distress.  HENT:  Head: Normocephalic.  Eyes: Pupils are equal, round, and reactive to light.  Neck: Normal range of motion.  Cardiovascular: Normal rate.   Respiratory: Effort normal.  GI: Soft. She exhibits no distension. There is no tenderness. There is no rebound.  Genitourinary: Cervix exhibits no discharge (no discharge elicited with cough). There is bleeding around the vagina. No signs of injury around the vagina.       No lacerations along vaginal walls. Bloody fluid moderate amount in vaginal canal removed with fox swabs. Quail egg sized clot removed.  Cervix appears intact, slightly open, + ectropion, no fetal membranes / polyp visible. Digital exam deferred  Musculoskeletal: Normal range of motion. She exhibits no edema.  Neurological: She is alert and oriented to person, place, and time.  Skin: Skin is warm.  Psychiatric: She has a normal mood and  affect.   EFM: FHR 145, no decel's, moderate variability, appropriate for gestational age           31: no ctx noted MAU Course  Procedures  MDM OB sono   Assessment and Plan  IUP at 23 1/7 wks Vaginal bleeding and ? PPROM   OB sono for placenta location / presentation / AFI / cervix cath UA Dr. Pamala Hurry updated w/ findings and POC. MD will follow.   Susan Montoya 03/01/2012, 7:52 AM

## 2012-03-01 NOTE — MAU Note (Signed)
Pt presents to MAU with CC of bleeding and possible rupture of membranes. Pt is a G2P1 at 43w1dsays this morning at 0550 she had intercourse and following noticed spotting. Pt was on her way to work at [employer] had a gush of bloody/watery fluid that soaked through her pants.

## 2012-03-01 NOTE — Consult Note (Signed)
Neonatology Consult to Antenatal Patient:  Susan Montoya is admitted today at 23 1/[redacted] weeks GA after SROM at home. She is currently not having active labor. She is getting BMZ and IV Ampicillin and Zithromax. 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 There was thickened nuchal translucency on ultrasound, but a normal AFP and harmony screening.  I spoke with the patient alone part of the time, then the father of the baby joined [place]. We discussed the worst case of delivery in the next 1-2 days, including usual DR management, possible respiratory complications and need for support, IV access, LOS, Mortality and Morbidity, and long term outcomes. I explained why 23 weeks is the limit of viability and variable response to resuscitative efforts may be seen between 23-25 weeks.   Because of the very poor long term neurodevelopmental outcomes at [redacted] weeks GA, I let the patient know that she and Dr. Valentino Saxon could decide whether or not to ask the resuscitation team to attend if delivery were necessary, especially in the next few days. She expressed that she wants full resuscitative measures done at this time. She did not have any questions. I offered a NICU tour to any interested family members and would be glad to come back if she has more questions later.  Thank you for asking me to see this patient.  Caleb Popp, MD Neonatologist  Time spent: 25 minutes

## 2012-03-01 NOTE — MAU Note (Signed)
Had intercourse about 0550 and then saw spotting. Was on way to work at [employer] and had gush of bloody fld. NO pain

## 2012-03-01 NOTE — H&P (Signed)
Susan Montoya is a 36 y.o. G2P1001 at 89w1dby early ultrasound and LMP dating who notes large gush of bloody fluid early this a.m. Patient notes having a normal day yesterday and a normal night last night. This a.m. had intercourse with partner and after intercourse noted some bright red vaginal spotting. No pain and no leakage of fluid. No pain during intercourse. Patient then took a shower and got ready for work. On her way to work  patient felt a sudden gush of fluid per vagina. Patient states bloody fluid leaked through her pants and on to her car seat. There was a 10 cm in diameter stain on her CT. Patient then presented for evaluation. Patient continued to note no contractions and good fetal movement. Patient continued without pain. On evaluation in maternity admissions there was a large amount of bloodstained fluid in the vagina. There was also a quail egg size clot in the vagina. Once these were removed with multiple Fox swabs,  no additional fluid was noted from the cervix.  [facility] - Advanced maternal age - Thickened nuchal translucency, normal AFP, normal harmony screening - Prior full-term delivery 6-1/2 pounds, 12 years ago - History of genital HSV, no recent outbreaks - Crohn's disease, well controlled, no flares since prior resection -Beta thalassemia trait - First trimester vaginal bleeding   Prenatal Transfer Tool  Maternal Diabetes: No, too early Genetic Screening: Abnormal:  Results: Other:nl AFP, nl Harmony, increased nuchal thickenss Maternal Ultrasounds/Referrals: Normal Fetal Ultrasounds or other Referrals:  None Maternal Substance Abuse:  No Significant Maternal Medications:  Meds include: Other: PNV Significant Maternal Lab Results: None     OB History    Grav Para Term Preterm Abortions TAB SAB Ect Mult Living   2 1 1  0 0 0 0 0 0 1    FT, SVD, 6.5#, 12 yrs ago  Past Medical History  Diagnosis Date  . Crohn's disease     beta thalassemia trait  Past Surgical History  Procedure Date  . Appendectomy   . Small intestine surgery     1 ft removed   Family History: family history includes Cancer in her mother and paternal grandmother; Diabetes in her paternal grandfather and paternal grandmother; Hyperlipidemia in her maternal grandfather and mother; and Hypertension in her brother, father, maternal grandfather, maternal grandmother, and mother. Social History:  reports that she has never smoked. She does not have any smokeless tobacco history on file. She reports that she does not drink alcohol or use illicit drugs.  Review of Systems - Negative except Leakage of bloody fluid     Blood pressure 108/60, pulse 69, temperature 97.8 F (36.6 C), resp. rate 20, height 5' 3"  (1.6 m), weight 60.782 kg (134 lb), last menstrual period 09/25/2011.  Physical Exam: Gen: well appearing, no distress CV: RRR Pulm: CTAB Back: no CVAT Abd: gravid, NT, no RUQ pain, fundus at umbilicus LE: no edema, equal bilaterally, non-tender Toco: No contractions  FH: baseline 140s, 10 x 10  accelerations present, no deceleratons, 10 beat variability, appropriate for gestational age GU: Exam in MAU documented in prior note, repeat speculum exam by me: No fluid in the vagina, no blood in the vagina, no clots in the vagina. Normal vaginal tissue. Normal-appearing cervix, the cervix appears long and closed. Small amount of blood-tinged mucus at the external os and in the upper vagina. One Fox swab of mucus.   Dry slide: Sperm present, occasional white blood cell, occasional  red blood cell, no ferning seen  CBC: Pending Urine culture: Pending Gonorrhea and Chlamydia: Pending GBS: Pending Amniosure: Pending, unsure if we'll be able to run do to scant blood  Ultrasound: Grossly normal amniotic fluid index, viable IUP at 23 weeks, no placenta previa, no placental abruption, maximum vertical amniotic fluid pocket 6 cm, cervix 3 cm in  length   Prenatal labs: ABO, Rh:  Oh positive  Antibody:  Negative  Rubella:  Immune  RPR:   Nonreactive  HBsAg:    HIV:    GBS:   Pending  1 hr Glucola to early, not done  Genetic screening abnormal nuchal thickness, normal harmony, normal AFP  Anatomy US normal   Assessment/Plan:36 year old G2 P1 001 at 23 weeks and 1 day with leakage of bloody fluid times one, unclear etiology. - Leakage of fluid. While urinary incontinence and seminal fluid are both possibilities, the timing from intercourse, the lack of urinary urge, and the amount of fluid in the vagina despite her upright posture argues against either of these etiologies. Midtrimester preterm premature rupture of membranes, despite the low prevalence of this diagnosis, is the most likely and worrisome etiology. Placental abruption, chronic, alone or in combination with preterm P. PROM is also in the differential diagnosis. First trimester spotting and advanced maternal age as well as prior cryotherapy does elevate her risks for the above. Preterm labor and cervical incompetence are also part of the differential diagnosis, however patient is not exhibiting any contractions, does not feel any abdominal pain, cervix is long and closed by speculum exam and cervical length.  We discussed the possibility of an amnio dye test to solidify a diagnosis of rupture of membranes. No ferning was seen on a dry slide, however this cannot rule out rupture of membranes at 23 weeks. Amnio sure is pending however false negatives exist to to both bleeding and early gestational age. I also discussed with patient given the amount of bleeding we would plan to watch her in-house even if she was confirmed to not ruptured. Given the small risks with amniocentesis patient has declined this procedure at this time. We also discussed the limits of viability between 23 and 24 weeks. We discussed the decision to proceed with betamethasone and now or waiting either 24 hours  or one week. We discussed the possibility of no neonatal resuscitation should delivery occur in the next 24 hours. Given the patient's desire for aggressive management and neonatal resuscitation at this time we then decided to proceed with betamethasone and latency antibiotics. We discussed magnesium sulfate for neuro protection, but also discussed this is best given within 24 hours of delivery. As patient exhibits no symptoms of chorioamnionitis or preterm labor, will defer magnesium at this time.   We discussed the unclear prognosis and diagnosis. I explained to the patient that the studies are limited in midtrimester rupture of membranes but that average latency periods are about 1 week. Should she stay pregnant for the next 24 hours latency averages increased to around 2 weeks. I also told the patient we would have the maternal fetal medicine consultants further discuss these issues with her tomorrow. We also discussed that should clinical concern for chorioamnionitis develop delivery would be inevitable.  Patient expressed understanding for all of the above issues, and agreed to the current plan of action.  Fetal well being. Concerns due to prematurity. Current testing reactive.  45 minutes was spent in discussing these issues with the patient  Kenita Bines A. 03/01/2012, 11:08 AM

## 2012-03-02 ENCOUNTER — Encounter (HOSPITAL_COMMUNITY): Payer: Self-pay

## 2012-03-02 DIAGNOSIS — O4690 Antepartum hemorrhage, unspecified, unspecified trimester: Secondary | ICD-10-CM | POA: Diagnosis present

## 2012-03-02 LAB — CBC
MCH: 21 pg — ABNORMAL LOW (ref 26.0–34.0)
MCHC: 32.2 g/dL (ref 30.0–36.0)
MCV: 65.4 fL — ABNORMAL LOW (ref 78.0–100.0)
Platelets: 235 10*3/uL (ref 150–400)
RBC: 3.9 MIL/uL (ref 3.87–5.11)
RDW: 15.4 % (ref 11.5–15.5)

## 2012-03-02 LAB — GC/CHLAMYDIA PROBE AMP, GENITAL
Chlamydia, DNA Probe: NEGATIVE
GC Probe Amp, Genital: NEGATIVE

## 2012-03-02 MED ORDER — LACTATED RINGERS IV SOLN
INTRAVENOUS | Status: DC
  Administered 2012-03-02 – 2012-03-03 (×3): via INTRAVENOUS

## 2012-03-02 NOTE — Progress Notes (Signed)
Ur chart review completed.  

## 2012-03-02 NOTE — Progress Notes (Signed)
HD #2 23 2/7 weeks ? PPROM  S: denies any further leakage or bleeding (+) FM (-) CTX  O: VS BP 110/68, 98.4 P99 Lungs clear to A Cor RRR Abd; gravid soft nontender Pad. No discharge or blood noted Extremity(-) edema or calf tenderness  Tracing; (-) ctx. FHR 150  IMP: ? PPROM IUP @ 23 2/7 weeks on IV antibiotic protocol.  Pt was seen by neonatology  P) cont antibiotic. sono in am w/ MFM and consult

## 2012-03-03 ENCOUNTER — Inpatient Hospital Stay (HOSPITAL_COMMUNITY): Payer: 59

## 2012-03-03 ENCOUNTER — Encounter (HOSPITAL_COMMUNITY): Payer: Self-pay | Admitting: Maternal and Fetal Medicine

## 2012-03-03 LAB — URINE CULTURE: Culture: NO GROWTH

## 2012-03-03 LAB — CULTURE, BETA STREP (GROUP B ONLY)

## 2012-03-03 NOTE — Progress Notes (Signed)
To ultrasound

## 2012-03-03 NOTE — Consult Note (Signed)
MATERNAL FETAL MEDICINE CONSULT  Patient Name: Susan Montoya Medical Record Number:  [id number] Date of Birth: 01/18/1976 Requesting Physician Name:  Claiborne Billings A. Pamala Hurry, MD Date of Service: 03/03/2012  Chief Complaint Possible PPROM  History of Present Illness Susan Montoya was seen today secondary to possible PPROM at the request of Claiborne Billings A. Pamala Hurry, MD.  The patient is a 36 y.o. G2P1001,at 51w3dwith an EDD of 06/27/2012, by Other Basis dating method.  She had an episode of bleeding and a gush of fluid this three days ago that prompted the hospitalization.  Bloody fluid and clot was noted on speculum exam at the time of admission.  Amnisure was positive for rupture at that time as well.  She has not had any further leaking or vaginal bleeding since this initial episode.  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  The father of the baby has not been tested, but he has not family history of sickle cell disease, other hemoglobinopathy, or thalassemia.    Review of Systems Pertinent items are noted in HPI.  Patient History OB History    Grav Para Term Preterm Abortions TAB SAB Ect Mult Living   2 1 1  0 0 0 0 0 0 1     # Outc Date GA Lbr Len/2nd Wgt Sex Del Anes PTL Lv   1 TRM            2 CUR               Past Medical History  Diagnosis Date  . Crohn's disease   . Vaginal bleeding in pregnancy - 23 wks/?PPROM 03/02/2012  . Beta thalassemia minor     Past Surgical History  Procedure Date  . Appendectomy   . Small intestine surgery     1 ft removed    History   Social History  . Marital Status: Married    Spouse Name: N/A    Number of Children: N/A  . Years of Education: N/A   Social History Main Topics  . Smoking status: Never Smoker   . Smokeless tobacco: None  . Alcohol Use: No  . Drug Use: No  . Sexually Active: Yes   Other Topics Concern  . None   Social History Narrative  . None    Family History  Problem Relation Age of Onset  . Cancer  Mother     breast  . Hyperlipidemia Mother   . Hypertension Mother   . Hypertension Father   . Hypertension Brother   . Hypertension Maternal Grandmother   . Hyperlipidemia Maternal Grandfather   . Hypertension Maternal Grandfather   . Diabetes Paternal Grandmother   . Cancer Paternal Grandmother     breast  . Diabetes Paternal Grandfather    The patient has a family history beta-thalassemia minor.  Otherwise she has no family history of mental retardation, birth defects, or genetic diseases.  Physical Examination Filed Vitals:   03/03/12 0732  BP: 108/65  Pulse: 80  Temp: 98.2 F (36.8 C)  Resp: 18   General appearance - alert, well appearing, and in no distress Abdomen - soft, nontender, nondistended, no masses or organomegaly Extremities - peripheral pulses normal, no pedal edema, no clubbing or cyanosis  Assessment and Recommendations 1.  Possible PPROM.  As the patient has reported nor further leakage and her AFI is 15 on today's ultrasound, it is likely that the patient is not ruptured.  The positive Amnisure was collected at the time of reported  bleeding and as a result may represent a false positive.  As she clearly has significant vaginal bleeding on presentation it is prudent to continue inpatient hospitalization for several more days.  If after 2-3 more days she has no further leaking or bleeding, it would be reasonable to repeat the Amnisure test.  She can be dismissed and followed as an outpatient if this is negative.  If positive despite a lack of leakage and normal AFI, I would recommend an amniocentesis and dye instillation to definitively rule out rupture. 2.  Vaginal bleeding.  The placenta appears normal and is anterior and superior to the cervix.  Thus, there is not cause for Ms. Parilla vaginal bleeding on today's ultrasound.  It likely represents minor cervical trauma as a result of intercourse.  If no further bleeding occurs over the 2-3 days and a repeat  Amnisure is negative, she can be dismissed and followed as an outpatient. 3.  Beta-thalassemia minor.  The father of the baby has not been tested for thalassemia or hemoglobinopathy.  He should have a hemoglobin electrophoresis.  Genetic counseling should also be considered as an outpatient.  Jolyn Lent, MD

## 2012-03-03 NOTE — Progress Notes (Signed)
HD #3  23 3/7 weeks ? PPROM  S: (+) FM denies vaginal bleeding or leakage of fluid  O: VS BP 108/65 98.2  Lungs clear to A Cor RRR Abd: gravid nontender Pad none  MFM consult; AFI ~15 cm  Tracing> no ctx  (+) FHR 145  IMP: 3rd vaginal bleeding( hx) unexplained IUP @ 23 3/7 w ? PPROm  S/P Iv antibiotics now on oral protocol with normal fluid level   disc with pt use of amniocentesis w/ dye to determine if ROM vs as disc by MFM waiting for few more days repeat amniosure and or sono. Pt worried regarding risk of amniocentesis  P) cont present mgmt. Pt will let me know if wants to do amnio

## 2012-03-03 NOTE — Progress Notes (Signed)
Pt placed on monitor per pt request

## 2012-03-04 NOTE — Progress Notes (Signed)
S; notes small amount of yellow vaginal discharge but no different than in the past. (+) FM denies leaking fluid  O: VSS  BP 101/56  Lungs: clear to A Cor: RRR Abd: gravid, nontender Pelvic: deferred Extremity: no edema or calf tenderness  Tracing; no ctx (+) FHR 146  IMP: Suspect not PPROM IUP @ 23 4/7 week P) reviewed MFM note. Will do [employer]

## 2012-03-04 NOTE — Progress Notes (Signed)
Spoke with Dr. Garwin Brothers ok for pt to go to [facility] class reguired by [facility] health

## 2012-03-05 LAB — TYPE AND SCREEN: Antibody Screen: NEGATIVE

## 2012-03-05 NOTE — Discharge Summary (Signed)
Obstetric Discharge Summary Reason for Admission: rupture of membranes and vaginal bleeding. IUP @ 23 1/7 weeks Prenatal Procedures: ultrasound Intrapartum Procedures: amniosure, ultrasound, MFM consult Postpartum Procedures: none Complications-Operative and Postpartum: none Hemoglobin  Date Value Range Status  03/02/2012 8.2* 12.0 - 15.0 g/dL Final     HCT  Date Value Range Status  03/02/2012 25.5* 36.0 - 46.0 % Final    Physical Exam:  General: alert, cooperative and no distress Lochia: n/a Uterine Fundus: gravid soft nontender Pad no discharge Incision: n/a DVT Evaluation: No evidence of DVT seen on physical exam. Hospital course:  Pt presented with c/o vaginal bleeding and leakage of fluid after interourse. amniosure was positive. Pt was therefore admitted with PPROM diagnosis. She was started on antibiotic prophylaxis. MFM consult and sono done next day. Nl amniotic fluid was noted. NICU consult obtained. Repeat amniosure was neg and no further leakage or bleeding during admit.  Discharge Diagnoses: IUP @ 23 /[redacted] weeks gestation.. No evidence of PPROM, unexplained 3rd trimester vaginal bleeding  Discharge Information: Date: 03/05/2012 Activity: unrestricted Diet: routine Medications: PNV Condition: stable Instructions: call if vaginal bleeding, decreased FM, leakage of fluid Discharge to: home Follow-up Information    Follow up with Finbar Nippert A, MD. On 03/09/2012.   Contact information:   [place] [id number] [place] A 03/05/2012, 12:45 PM

## 2012-03-05 NOTE — Progress Notes (Signed)
Discharged home via wheelchair with family.

## 2012-03-05 NOTE — Progress Notes (Signed)
S: no complaint (+) FM denies vaginal bleeding or leakage of fluid  O: VSS afebrile Lungs clear to A Cor RRR Abd: gravid nontender Pad no d/c Extr(-) edema  Tracing (+) FHr 150  amniosure neg  IMP: no evidence of PPROM IUP @ 23 + weeks P) d/c home. F/u appt office 11/11 with repeat sonogram. PTL prec. Abstain from intercourse

## 2012-03-10 ENCOUNTER — Telehealth: Payer: Self-pay | Admitting: Family

## 2012-03-10 NOTE — Telephone Encounter (Signed)
Pt states that she is better and has been cleared to return to work.

## 2012-03-10 NOTE — Telephone Encounter (Signed)
Patient has premature rupture of the membranes. Please call and just check on her.

## 2012-04-29 NOTE — L&D Delivery Note (Signed)
Delivery Note At 7:59 PM a viable and healthy female was delivered via Vaginal, Spontaneous Delivery (Presentation: Left Occiput Anterior).  APGAR: 8, 9; weight 7 lb 10 oz (3459 g).   Placenta status: Intact,  Spontaneous. No sent  Cord: 3 vessels with the following complications: Long.  CAN x 2 reducible Cord pH: none  Anesthesia: Local  Episiotomy: None Lacerations: 2nd degree;Perineal Suture Repair: 3.0 chromic Est. Blood Loss (mL): 250  Mom to postpartum.  Baby to nursery-stable.  Victorine Mcnee A 06/24/2012, 3:09 AM

## 2012-05-27 LAB — OB RESULTS CONSOLE GBS: GBS: NEGATIVE

## 2012-06-18 ENCOUNTER — Encounter (HOSPITAL_COMMUNITY): Payer: Self-pay | Admitting: *Deleted

## 2012-06-18 ENCOUNTER — Inpatient Hospital Stay (HOSPITAL_COMMUNITY)
Admission: AD | Admit: 2012-06-18 | Discharge: 2012-06-19 | Disposition: A | Discharge status: Home / Self Care | Payer: 59 | Source: Ambulatory Visit | Attending: Obstetrics | Admitting: Obstetrics

## 2012-06-18 DIAGNOSIS — O479 False labor, unspecified: Secondary | ICD-10-CM | POA: Insufficient documentation

## 2012-06-18 NOTE — Progress Notes (Signed)
Pt states she took some Tylenol

## 2012-06-18 NOTE — MAU Note (Signed)
Pt states she has been having contraction for about 4 hours, pt states contractions are 4-5 mins apart

## 2012-06-22 ENCOUNTER — Ambulatory Visit (INDEPENDENT_AMBULATORY_CARE_PROVIDER_SITE_OTHER): Payer: 59 | Admitting: Family

## 2012-06-22 ENCOUNTER — Encounter: Payer: Self-pay | Admitting: Family

## 2012-06-22 VITALS — BP 120/80 | HR 99 | Temp 98.1°F | Wt 152.0 lb

## 2012-06-22 DIAGNOSIS — J069 Acute upper respiratory infection, unspecified: Secondary | ICD-10-CM

## 2012-06-22 DIAGNOSIS — O0001 Abdominal pregnancy with intrauterine pregnancy: Secondary | ICD-10-CM

## 2012-06-22 MED ORDER — AMOXICILLIN 500 MG PO TABS: 1000.0000 mg | ORAL_TABLET | Freq: Two times a day (BID) | ORAL | Status: AC

## 2012-06-22 NOTE — Progress Notes (Signed)
Subjective:    Patient ID: Susan Montoya, female    DOB: 03-02-1976, 37 y.o.   MRN: [id number]  HPI 37 year old female, [redacted] week gestation, is in today with complaints of sneezing, cough, congestion, headache with yellow drainage has been ongoing x1 week. She was seen by gynecology who performed a rapid strep that was -4 days ago. Her symptoms persist. She is due to deliver in 4 days. Has been taken over-the-counter Claritin with no relief.   Review of Systems  Constitutional: Negative.   HENT: Positive for congestion, sore throat, sneezing and postnasal drip.   Eyes: Negative.   Respiratory: Positive for cough.   Cardiovascular: Negative.   Musculoskeletal: Negative.   Skin: Negative.   Neurological: Negative.   Hematological: Negative.   Psychiatric/Behavioral: Negative.    Past Medical History  Diagnosis Date  . Crohn's disease   . Vaginal bleeding in pregnancy - 23 wks/?PPROM 03/02/2012  . Beta thalassemia minor     History   Social History  . Marital Status: Married    Spouse Name: N/A    Number of Children: N/A  . Years of Education: N/A   Occupational History  . Not on file.   Social History Main Topics  . Smoking status: Never Smoker   . Smokeless tobacco: Not on file  . Alcohol Use: No  . Drug Use: No  . Sexually Active: Yes   Other Topics Concern  . Not on file   Social History Narrative  . No narrative on file    Past Surgical History  Procedure Laterality Date  . Appendectomy    . Small intestine surgery      1 ft removed    Family History  Problem Relation Age of Onset  . Cancer Mother     breast  . Hyperlipidemia Mother   . Hypertension Mother   . Hypertension Father   . Hypertension Brother   . Hypertension Maternal Grandmother   . Hyperlipidemia Maternal Grandfather   . Hypertension Maternal Grandfather   . Diabetes Paternal Grandmother   . Cancer Paternal Grandmother     breast  . Diabetes Paternal Grandfather     No Known  Allergies  Current Outpatient Prescriptions on File Prior to Visit  Medication Sig Dispense Refill  . Prenatal Vit-Fe Fumarate-FA (PRENATAL MULTIVITAMIN) TABS Take 1 tablet by mouth every morning.      . calcium carbonate (TUMS - DOSED IN MG ELEMENTAL CALCIUM) 500 MG chewable tablet Chew 2 tablets by mouth 2 (two) times daily as needed. For heartburn      . pantoprazole (PROTONIX) 40 MG tablet Take 40 mg by mouth daily.      . valACYclovir (VALTREX) 500 MG tablet Take 500 mg by mouth 2 (two) times daily.       No current facility-administered medications on file prior to visit.    BP 120/80  Pulse 99  Temp(Src) 98.1 F (36.7 C) (Oral)  Wt 152 lb (68.947 kg)  BMI 26.93 kg/m2  SpO2 98%  LMP 05/29/2013chart    Objective:   Physical Exam  Constitutional: She is oriented to person, place, and time. She appears well-developed and well-nourished.  HENT:  Right Ear: External ear normal.  Left Ear: External ear normal.  Nose: Nose normal.  Mouth/Throat: Oropharynx is clear and moist.  Neck: Normal range of motion. Neck supple.  Cardiovascular: Normal rate, regular rhythm and normal heart sounds.   Pulmonary/Chest: Effort normal and breath sounds normal.  Musculoskeletal: Normal range  of motion.  Neurological: She is alert and oriented to person, place, and time.  Skin: Skin is warm and dry.  Psychiatric: She has a normal mood and affect.          Assessment & Plan:  Assessment:   1. Upper respiratory infection-cover for bacterial source since she will be delivering in a day. 2. Pregnancy test third trimester  Plan: Amoxicillin 5 mg 2 capsules by mouth twice a day x10 days. Rest. Drink plenty of fluids. Patient call the office if symptoms worsen or persist. Recheck a schedule, and as needed.

## 2012-06-22 NOTE — Patient Instructions (Addendum)

## 2012-06-23 ENCOUNTER — Inpatient Hospital Stay (HOSPITAL_COMMUNITY)
Admission: AD | Admit: 2012-06-23 | Discharge: 2012-06-25 | DRG: 774 | Disposition: A | Discharge status: Home / Self Care | Payer: 59 | Source: Ambulatory Visit | Attending: Obstetrics and Gynecology | Admitting: Obstetrics and Gynecology

## 2012-06-23 ENCOUNTER — Encounter (HOSPITAL_COMMUNITY): Payer: Self-pay | Admitting: *Deleted

## 2012-06-23 DIAGNOSIS — O878 Other venous complications in the puerperium: Secondary | ICD-10-CM | POA: Diagnosis present

## 2012-06-23 DIAGNOSIS — O99892 Other specified diseases and conditions complicating childbirth: Secondary | ICD-10-CM | POA: Diagnosis present

## 2012-06-23 DIAGNOSIS — O98519 Other viral diseases complicating pregnancy, unspecified trimester: Secondary | ICD-10-CM | POA: Diagnosis present

## 2012-06-23 DIAGNOSIS — O09529 Supervision of elderly multigravida, unspecified trimester: Secondary | ICD-10-CM | POA: Diagnosis present

## 2012-06-23 DIAGNOSIS — A6000 Herpesviral infection of urogenital system, unspecified: Secondary | ICD-10-CM | POA: Diagnosis present

## 2012-06-23 DIAGNOSIS — O4693 Antepartum hemorrhage, unspecified, third trimester: Secondary | ICD-10-CM

## 2012-06-23 DIAGNOSIS — K5090 Crohn's disease, unspecified, without complications: Secondary | ICD-10-CM | POA: Diagnosis present

## 2012-06-23 DIAGNOSIS — K649 Unspecified hemorrhoids: Secondary | ICD-10-CM | POA: Diagnosis present

## 2012-06-23 DIAGNOSIS — O368190 Decreased fetal movements, unspecified trimester, not applicable or unspecified: Principal | ICD-10-CM | POA: Diagnosis present

## 2012-06-23 DIAGNOSIS — J069 Acute upper respiratory infection, unspecified: Secondary | ICD-10-CM | POA: Diagnosis present

## 2012-06-23 LAB — CBC
MCH: 21.2 pg — ABNORMAL LOW (ref 26.0–34.0)
Platelets: 182 10*3/uL (ref 150–400)
RBC: 4.85 MIL/uL (ref 3.87–5.11)
WBC: 12.9 10*3/uL — ABNORMAL HIGH (ref 4.0–10.5)

## 2012-06-23 LAB — TYPE AND SCREEN: Antibody Screen: NEGATIVE

## 2012-06-23 LAB — RPR: RPR Ser Ql: NONREACTIVE

## 2012-06-23 MED ORDER — WITCH HAZEL-GLYCERIN EX PADS
1.0000 "application " | MEDICATED_PAD | CUTANEOUS | Status: DC | PRN
  Administered 2012-06-24: 1 via TOPICAL

## 2012-06-23 MED ORDER — BUTORPHANOL TARTRATE 1 MG/ML IJ SOLN: 1.0000 mg | Freq: Once | INTRAMUSCULAR | Status: DC

## 2012-06-23 MED ORDER — FERROUS SULFATE 325 (65 FE) MG PO TABS
325.0000 mg | ORAL_TABLET | Freq: Two times a day (BID) | ORAL | Status: DC
  Administered 2012-06-24 (×2): 325 mg via ORAL
  Filled 2012-06-23 (×2): qty 1

## 2012-06-23 MED ORDER — LACTATED RINGERS IV SOLN: INTRAVENOUS | Status: DC

## 2012-06-23 MED ORDER — AMOXICILLIN 500 MG PO CAPS
1000.0000 mg | ORAL_CAPSULE | Freq: Two times a day (BID) | ORAL | Status: DC
  Administered 2012-06-23 – 2012-06-25 (×4): 1000 mg via ORAL
  Filled 2012-06-23 (×4): qty 2

## 2012-06-23 MED ORDER — ZOLPIDEM TARTRATE 5 MG PO TABS: 5.0000 mg | ORAL_TABLET | Freq: Every evening | ORAL | Status: DC | PRN

## 2012-06-23 MED ORDER — LACTATED RINGERS IV SOLN
INTRAVENOUS | Status: DC
  Administered 2012-06-23: 14:00:00 via INTRAVENOUS

## 2012-06-23 MED ORDER — PRENATAL MULTIVITAMIN CH
1.0000 | ORAL_TABLET | Freq: Every day | ORAL | Status: DC
  Administered 2012-06-24 – 2012-06-25 (×2): 1 via ORAL
  Filled 2012-06-23 (×2): qty 1

## 2012-06-23 MED ORDER — DIPHENHYDRAMINE HCL 25 MG PO CAPS: 25.0000 mg | ORAL_CAPSULE | Freq: Four times a day (QID) | ORAL | Status: DC | PRN

## 2012-06-23 MED ORDER — SENNOSIDES-DOCUSATE SODIUM 8.6-50 MG PO TABS
2.0000 | ORAL_TABLET | Freq: Every day | ORAL | Status: DC
  Administered 2012-06-23 – 2012-06-25 (×2): 2 via ORAL

## 2012-06-23 MED ORDER — GUAIFENESIN 100 MG/5ML PO SOLN
100.0000 mg | Freq: Four times a day (QID) | ORAL | Status: DC | PRN
  Administered 2012-06-24: 200 mg via ORAL
  Filled 2012-06-23 (×2): qty 15

## 2012-06-23 MED ORDER — OXYCODONE-ACETAMINOPHEN 5-325 MG PO TABS
1.0000 | ORAL_TABLET | ORAL | Status: DC | PRN
  Administered 2012-06-24 – 2012-06-25 (×2): 1 via ORAL
  Filled 2012-06-23 (×2): qty 1

## 2012-06-23 MED ORDER — TERBUTALINE SULFATE 1 MG/ML IJ SOLN: 0.2500 mg | Freq: Once | INTRAMUSCULAR | Status: DC | PRN

## 2012-06-23 MED ORDER — OXYTOCIN BOLUS FROM INFUSION
500.0000 mL | INTRAVENOUS | Status: DC
  Administered 2012-06-23: 500 mL via INTRAVENOUS

## 2012-06-23 MED ORDER — SIMETHICONE 80 MG PO CHEW: 80.0000 mg | CHEWABLE_TABLET | ORAL | Status: DC | PRN

## 2012-06-23 MED ORDER — LORATADINE 10 MG PO TABS
10.0000 mg | ORAL_TABLET | Freq: Every day | ORAL | Status: DC
  Administered 2012-06-24: 10 mg via ORAL
  Filled 2012-06-23 (×2): qty 1

## 2012-06-23 MED ORDER — ONDANSETRON HCL 4 MG/2ML IJ SOLN: 4.0000 mg | INTRAMUSCULAR | Status: DC | PRN

## 2012-06-23 MED ORDER — OXYTOCIN 40 UNITS IN LACTATED RINGERS INFUSION - SIMPLE MED
62.5000 mL/h | INTRAVENOUS | Status: DC
  Administered 2012-06-23: 62.5 mL/h via INTRAVENOUS
  Filled 2012-06-23: qty 1000

## 2012-06-23 MED ORDER — CITRIC ACID-SODIUM CITRATE 334-500 MG/5ML PO SOLN: 30.0000 mL | ORAL | Status: DC | PRN

## 2012-06-23 MED ORDER — LIDOCAINE HCL (PF) 1 % IJ SOLN
30.0000 mL | INTRAMUSCULAR | Status: DC | PRN
  Administered 2012-06-23: 30 mL via SUBCUTANEOUS
  Filled 2012-06-23 (×2): qty 30

## 2012-06-23 MED ORDER — OXYTOCIN 10 UNIT/ML IJ SOLN: 10.0000 [IU] | Freq: Once | INTRAMUSCULAR | Status: DC

## 2012-06-23 MED ORDER — NALBUPHINE SYRINGE 5 MG/0.5 ML
10.0000 mg | INJECTION | INTRAMUSCULAR | Status: DC | PRN
  Administered 2012-06-23 (×2): 10 mg via INTRAVENOUS
  Filled 2012-06-23 (×3): qty 1

## 2012-06-23 MED ORDER — LACTATED RINGERS IV SOLN: 500.0000 mL | INTRAVENOUS | Status: DC | PRN

## 2012-06-23 MED ORDER — LANOLIN HYDROUS EX OINT: TOPICAL_OINTMENT | CUTANEOUS | Status: DC | PRN

## 2012-06-23 MED ORDER — DIBUCAINE 1 % RE OINT
1.0000 "application " | TOPICAL_OINTMENT | RECTAL | Status: DC | PRN
  Administered 2012-06-24: 1 via RECTAL
  Filled 2012-06-23: qty 28

## 2012-06-23 MED ORDER — PANTOPRAZOLE SODIUM 40 MG PO TBEC
40.0000 mg | DELAYED_RELEASE_TABLET | Freq: Every day | ORAL | Status: DC
  Administered 2012-06-23 – 2012-06-24 (×2): 40 mg via ORAL
  Filled 2012-06-23 (×2): qty 1

## 2012-06-23 MED ORDER — OXYTOCIN 40 UNITS IN LACTATED RINGERS INFUSION - SIMPLE MED
1.0000 m[IU]/min | INTRAVENOUS | Status: DC
  Administered 2012-06-23: 2 m[IU]/min via INTRAVENOUS

## 2012-06-23 MED ORDER — ONDANSETRON HCL 4 MG/2ML IJ SOLN
4.0000 mg | Freq: Four times a day (QID) | INTRAMUSCULAR | Status: DC | PRN
  Administered 2012-06-23: 4 mg via INTRAVENOUS
  Filled 2012-06-23: qty 2

## 2012-06-23 MED ORDER — ACETAMINOPHEN 325 MG PO TABS: 650.0000 mg | ORAL_TABLET | ORAL | Status: DC | PRN

## 2012-06-23 MED ORDER — IBUPROFEN 600 MG PO TABS
600.0000 mg | ORAL_TABLET | Freq: Four times a day (QID) | ORAL | Status: DC | PRN
  Administered 2012-06-23: 600 mg via ORAL
  Filled 2012-06-23: qty 1

## 2012-06-23 MED ORDER — ONDANSETRON HCL 4 MG PO TABS: 4.0000 mg | ORAL_TABLET | ORAL | Status: DC | PRN

## 2012-06-23 MED ORDER — BENZOCAINE-MENTHOL 20-0.5 % EX AERO
1.0000 "application " | INHALATION_SPRAY | CUTANEOUS | Status: DC | PRN
  Administered 2012-06-25: 1 via TOPICAL
  Filled 2012-06-23: qty 56

## 2012-06-23 MED ORDER — OXYCODONE-ACETAMINOPHEN 5-325 MG PO TABS: 1.0000 | ORAL_TABLET | ORAL | Status: DC | PRN

## 2012-06-23 MED ORDER — IBUPROFEN 600 MG PO TABS
600.0000 mg | ORAL_TABLET | Freq: Four times a day (QID) | ORAL | Status: DC
  Administered 2012-06-24 – 2012-06-25 (×4): 600 mg via ORAL
  Filled 2012-06-23 (×5): qty 1

## 2012-06-23 NOTE — Progress Notes (Signed)
S: breathing with ctx S/P Nubain  O: Pitocin 8 miu BP 146/92 VE 6/100/+1 bulging membrane AROM clear fluid  Tracing cat 1 baseline 140  dysfunctional uterine ctx pattern( couplets, quads)  IMP; active phase protracted P0 cont increase pitocin. Exaggerated right sims

## 2012-06-23 NOTE — H&P (Signed)
Susan Montoya is a 37 y.o. female presenting for admission  @ 39 1/7 weeks 2nd to active labor. Pt c/o decreased FM and bright red blood today (+) ctx. Seen in office where NST reactive (+) irreg ctx. (+) bloody show Maternal Medical History:  Reason for admission: Contractions.   Contractions: Onset was 3-5 hours ago.   Frequency: irregular.    Fetal activity: Perceived fetal activity is decreased.   Last perceived fetal movement was within the past hour.    Prenatal Complications - Diabetes: none.    OB History   Grav Para Term Preterm Abortions TAB SAB Ect Mult Living   2 1 1  0 0 0 0 0 0 1     Past Medical History  Diagnosis Date  . Crohn's disease   . Vaginal bleeding in pregnancy - 23 wks/?PPROM 03/02/2012  . Beta thalassemia minor    Past Surgical History  Procedure Laterality Date  . Appendectomy    . Small intestine surgery      1 ft removed   Family History: family history includes Cancer in her mother and paternal grandmother; Diabetes in her paternal grandfather and paternal grandmother; Hyperlipidemia in her maternal grandfather and mother; and Hypertension in her brother, father, maternal grandfather, maternal grandmother, and mother. Social History:  reports that she has never smoked. She does not have any smokeless tobacco history on file. She reports that she does not drink alcohol or use illicit drugs.   Prenatal Transfer Tool  Maternal Diabetes: No Genetic Screening: Abnormal:  Results: Elevated risk of Trisomy 21, Other:harmony test nl Maternal Ultrasounds/Referrals: Normal Fetal Ultrasounds or other Referrals:  None Maternal Substance Abuse:  No Significant Maternal Medications:  Meds include: Other: valtrex, amoxicillin Significant Maternal Lab Results:  Lab values include: Group B Strep negative Other Comments:  b thal trait. FOB neg.  ROS neg except per HPI    Blood pressure 144/100, pulse 89, temperature 98 F (36.7 C), temperature source Oral,  resp. rate 20, last menstrual period 09/25/2011. Maternal Exam:  Uterine Assessment: Contraction strength is moderate.  Contraction frequency is irregular.   Abdomen: Patient reports no abdominal tenderness. Estimated fetal weight is 6lb.   Fetal presentation: vertex  Introitus: Normal vulva. Vagina is positive for vaginal discharge.  Ferning test: not done.   Pelvis: adequate for delivery.   Cervix: Cervix evaluated by digital exam.     Physical Exam  Constitutional: She is oriented to person, place, and time. She appears well-developed and well-nourished.  HENT:  Head: Normocephalic.  Neck: Neck supple.  Cardiovascular: Regular rhythm and normal heart sounds.   Respiratory: Breath sounds normal.  GI: Soft.  Genitourinary: Vaginal discharge found.  Musculoskeletal: She exhibits no edema.  Neurological: She is alert and oriented to person, place, and time.  Skin: Skin is warm and dry.  Psychiatric: She has a normal mood and affect.   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 Amniotomy. Pain med Pitocin augmentation   Jasma Seevers A 06/23/2012, 1:46 PM

## 2012-06-24 LAB — CBC
HCT: 29.8 % — ABNORMAL LOW (ref 36.0–46.0)
Hemoglobin: 9.4 g/dL — ABNORMAL LOW (ref 12.0–15.0)
MCHC: 31.5 g/dL (ref 30.0–36.0)
RBC: 4.48 MIL/uL (ref 3.87–5.11)
RDW: 15.9 % — ABNORMAL HIGH (ref 11.5–15.5)
WBC: 13.6 10*3/uL — ABNORMAL HIGH (ref 4.0–10.5)

## 2012-06-24 MED ORDER — GUAIFENESIN ER 600 MG PO TB12
600.0000 mg | ORAL_TABLET | Freq: Two times a day (BID) | ORAL | Status: DC
  Administered 2012-06-24: 600 mg via ORAL
  Filled 2012-06-24 (×3): qty 1

## 2012-06-24 NOTE — Progress Notes (Signed)
Patient ID: Susan Montoya, female   DOB: 10/25/1975, 37 y.o.   MRN: [id number] PPD # 1  Subjective: Pt reports feeling well, other than persistent cough d/t URI.  Remains on amoxicillin and claritin/ Pain controlled with ibuprofen and percocet Tolerating po/ Voiding without problems/ No n/v Bleeding is light Newborn info:  Girl  Feeding: breast   Objective:  VS: Blood pressure 127/84, pulse 61, temperature 97.9 F (36.6 C), temperature source Oral, resp. rate 18.    Recent Labs  06/23/12 1345 06/24/12 0520  WBC 12.9* 13.6*  HGB 10.3* 9.4*  HCT 32.3* 29.8*  PLT 182 168    Blood type: --/--/O POS (02/25 1345) Rubella: Immune (07/22 0000)    Physical Exam:  General: alert, cooperative and no distress CV: Regular rate and rhythm Resp: clear Abdomen: soft, nontender, normal bowel sounds Uterine Fundus: firm, below umbilicus, nontender Perineum: healing with good reapproximation and mod labial edema Lochia: minimal Ext: Homans sign is negative, no sign of DVT and no edema, redness or tenderness in the calves or thighs   A/P: PPD # 1/ G2P2002/ S/P:spontaneous vaginal delivery with 2nd deg repair URI with persistent cough.  Will increase guaifenesin dose Hemorrhoids; will provide tucks and other meds to pt; nursing aware to place in room Doing well Continue routine post partum orders Anticipate D/C home in AM    Darleen Crocker, MSN, [facility] 06/24/2012, 9:54 AM

## 2012-06-25 MED ORDER — FERROUS SULFATE 325 (65 FE) MG PO TABS: 325.0000 mg | ORAL_TABLET | Freq: Two times a day (BID) | ORAL | Status: DC

## 2012-06-25 MED ORDER — OXYCODONE-ACETAMINOPHEN 5-325 MG PO TABS: 1.0000 | ORAL_TABLET | ORAL | Status: DC | PRN

## 2012-06-25 MED ORDER — IBUPROFEN 600 MG PO TABS: 600.0000 mg | ORAL_TABLET | Freq: Four times a day (QID) | ORAL | Status: DC | PRN

## 2012-06-25 NOTE — Discharge Summary (Signed)
Physician Discharge Summary  Patient ID: Susan Montoya MRN: [id number] DOB/AGE: 03-Jul-1975 37 y.o.  Admit date: 06/23/2012 Discharge date: 06/25/2012  Admission Diagnoses:  Labor, term gestation  Discharge Diagnoses:  Principal Problem:   Postpartum care following vaginal delivery (2/25) Active Problems:   SVD (spontaneous vaginal delivery)   Discharged Condition: stable  Hospital Course: incomplicated course with NSVD viable female  Consults: None  Significant Diagnostic Studies: labs: PN Routine labs normal - noted pre- existing anemia and radiology: Ultrasound: anatomy - normal  Treatments: IV hydration and analgesia: acetaminophen w/ codeine and Ibuprofen  Discharge Exam: Blood pressure 127/79, pulse 65, temperature 98.5 F (36.9 C), temperature source Oral, resp. rate 16, height 5' 3"  (1.6 m), weight 68.493 kg (151 lb), last menstrual period 09/25/2011, unknown if currently breastfeeding.  Physical Examination.  General appearance: alert, cooperative and no distress Affect: AAO x 3 Lungs: CTAB Breasts: Slight tenderness of nipples CV: RRR Abdomen: N/T Soft, B/S x 4 Fundus: -2/u, diastasis rectus Muscle - noted. GI: tolerating normal diet GU: Voiding with no problems Perineum: Slight edema  - advised to continue ice packs and Dermaplast Spray. Healing well Extremities: No swelling / edema of Upper and Lower limbs bilaterally   Disposition: 01-Home or Self Care  Discharge Orders   Future Orders Complete By Expires     Diet - low sodium heart healthy  As directed     Diet general  As directed     Discharge instructions  As directed     Comments:      Per [facility]        Medication List    STOP taking these medications       pantoprazole 40 MG tablet  Commonly known as:  PROTONIX      TAKE these medications       amoxicillin 500 MG tablet  Commonly known as:  AMOXIL  Take 2 tablets (1,000 mg total) by mouth 2 (two) times daily.     CLARITIN  10 MG tablet  Generic drug:  loratadine  Take 10 mg by mouth daily.     ferrous sulfate 325 (65 FE) MG tablet  Take 1 tablet (325 mg total) by mouth 2 (two) times daily with a meal.     ibuprofen 600 MG tablet  Commonly known as:  ADVIL,MOTRIN  Take 1 tablet (600 mg total) by mouth every 6 (six) hours as needed for pain.     oxyCODONE-acetaminophen 5-325 MG per tablet  Commonly known as:  PERCOCET/ROXICET  Take 1-2 tablets by mouth every 4 (four) hours as needed.     prenatal multivitamin Tabs  Take 1 tablet by mouth at bedtime.     valACYclovir 500 MG tablet  Commonly known as:  VALTREX  Take 500 mg by mouth daily.           Follow-up Information   Follow up with [facility]. In 6 weeks. (As needed)    Contact information:   [place] [id number] [phone]      Signed: Wyatt Mage 06/25/2012, 9:43 AM

## 2012-06-25 NOTE — Progress Notes (Signed)
Post Partum Day 2 NSVD, viable female. Subjective: no complaints, up ad lib without syncope, voiding, tolerating PO,  Pain well controlled with po meds,  BF: on demand - nipples tender Mood stable, bonding well   Objective: Blood pressure 127/79, pulse 65, temperature 98.5 F (36.9 C), temperature source Oral, resp. rate 16, height 5' 3"  (1.6 m), weight 68.493 kg (151 lb), last menstrual period 09/25/2011, unknown if currently breastfeeding.  Physical Exam:  General: NAD  Lochia: appropriate Uterine Fundus: firm -2/u, Diaphysis of rectus muscle noted. Perineum: healing well and using Dermaplast spray DVT Evaluation: No evidence of DVT seen on physical exam. Negative Homan's sign. No cords or calf tenderness.   Recent Labs  06/23/12 1345 06/24/12 0520  HGB 10.3* 9.4*  HCT 32.3* 29.8*   Anemia of pregnancy - delivered  Assessment/Plan: Discharge home Continue on po Iron Supplement po daily.  F/u with Peds as scheduled as outpatient.    LOS: 2 days   Dale Ribeiro 06/25/2012, 9:25 AM

## 2012-07-01 ENCOUNTER — Telehealth (HOSPITAL_COMMUNITY): Payer: Self-pay | Admitting: *Deleted

## 2012-07-01 NOTE — Telephone Encounter (Signed)
Resolve episode

## 2012-08-17 ENCOUNTER — Encounter: Payer: Self-pay | Admitting: Family

## 2012-08-17 ENCOUNTER — Ambulatory Visit (INDEPENDENT_AMBULATORY_CARE_PROVIDER_SITE_OTHER): Payer: 59 | Admitting: Family

## 2012-08-17 VITALS — BP 112/74 | HR 87

## 2012-08-17 DIAGNOSIS — L259 Unspecified contact dermatitis, unspecified cause: Secondary | ICD-10-CM

## 2012-08-17 DIAGNOSIS — L282 Other prurigo: Secondary | ICD-10-CM

## 2012-08-17 MED ORDER — METHYLPREDNISOLONE ACETATE 40 MG/ML IJ SUSP
80.0000 mg | Freq: Once | INTRAMUSCULAR | Status: AC
  Administered 2012-08-17: 80 mg via INTRAMUSCULAR

## 2012-08-17 MED ORDER — METHYLPREDNISOLONE ACETATE 80 MG/ML IJ SUSP: 80.0000 mg | Freq: Once | INTRAMUSCULAR | Status: DC

## 2012-08-18 ENCOUNTER — Telehealth: Payer: Self-pay | Admitting: Family

## 2012-08-18 MED ORDER — METHYLPREDNISOLONE 4 MG PO KIT: PACK | ORAL | Status: DC

## 2012-08-18 NOTE — Telephone Encounter (Signed)
Rx sent and pt aware

## 2012-08-18 NOTE — Telephone Encounter (Addendum)
Pt was seen yesterday for contact dermatitis and now requesting prednisone call into [facility] in [place] pharm [phone]

## 2012-08-18 NOTE — Progress Notes (Signed)
Subjective:    Patient ID: Susan Montoya, female    DOB: December 09, 1975, 37 y.o.   MRN: [id number]  HPI  37 year old female, nonsmoker, 8 weeks postpartum, female is in today with c/o an itchy skin rash to her arms, upper chest, and left neck x 3 days. She has been using Calamine lotion with no relief. Denies any changes in detergents, soaps, or lotions. She believes she came in contact with poison ivy by washing her husbands clothes after he pulled the plany up from their yard.   Review of Systems  Constitutional: Negative.   HENT: Negative.   Genitourinary: Negative.   Skin: Positive for rash.       Arms, neck, and chest  Allergic/Immunologic: Negative.   Hematological: Negative.   Psychiatric/Behavioral: Negative.    Past Medical History  Diagnosis Date  . Crohn's disease   . Vaginal bleeding in pregnancy - 23 wks/?PPROM 03/02/2012  . Beta thalassemia minor     History   Social History  . Marital Status: Divorced    Spouse Name: N/A    Number of Children: N/A  . Years of Education: N/A   Occupational History  . Not on file.   Social History Main Topics  . Smoking status: Never Smoker   . Smokeless tobacco: Not on file  . Alcohol Use: No  . Drug Use: No  . Sexually Active: Yes   Other Topics Concern  . Not on file   Social History Narrative  . No narrative on file    Past Surgical History  Procedure Laterality Date  . Appendectomy    . Small intestine surgery      1 ft removed    Family History  Problem Relation Age of Onset  . Cancer Mother     breast  . Hyperlipidemia Mother   . Hypertension Mother   . Hypertension Father   . Hypertension Brother   . Hypertension Maternal Grandmother   . Hyperlipidemia Maternal Grandfather   . Hypertension Maternal Grandfather   . Diabetes Paternal Grandmother   . Cancer Paternal Grandmother     breast  . Diabetes Paternal Grandfather     No Known Allergies  Current Outpatient Prescriptions on File Prior to  Visit  Medication Sig Dispense Refill  . ferrous sulfate 325 (65 FE) MG tablet Take 1 tablet (325 mg total) by mouth 2 (two) times daily with a meal.  60 tablet  4  . ibuprofen (ADVIL,MOTRIN) 600 MG tablet Take 1 tablet (600 mg total) by mouth every 6 (six) hours as needed for pain.  30 tablet  2  . loratadine (CLARITIN) 10 MG tablet Take 10 mg by mouth daily.      . Prenatal Vit-Fe Fumarate-FA (PRENATAL MULTIVITAMIN) TABS Take 1 tablet by mouth at bedtime.       . valACYclovir (VALTREX) 500 MG tablet Take 500 mg by mouth daily.       Marland Kitchen oxyCODONE-acetaminophen (PERCOCET/ROXICET) 5-325 MG per tablet Take 1-2 tablets by mouth every 4 (four) hours as needed.  12 tablet  0   No current facility-administered medications on file prior to visit.    BP 112/74  Pulse 87  SpO2 99%chart    Objective:   Physical Exam  Constitutional: She is oriented to person, place, and time. She appears well-developed and well-nourished.  Cardiovascular: Normal rate, regular rhythm and normal heart sounds.   Pulmonary/Chest: Effort normal and breath sounds normal.  Neurological: She is alert and oriented to person,  place, and time.  Skin: Skin is warm and dry. Rash noted.  Red, papular, diffuse rash noted to the arms, upper chest and left neck. No drainage or discharge.   Psychiatric: She has a normal mood and affect.      Depo-Medrol 59m IM x 1    Assessment & Plan:  Assessment:  1. Contact Dermatitis 2. Pruritus   Plan: Call if symptoms worsen or persist. Recheck as scheduled and as needed.

## 2012-10-19 ENCOUNTER — Encounter (HOSPITAL_BASED_OUTPATIENT_CLINIC_OR_DEPARTMENT_OTHER): Payer: Self-pay

## 2012-10-19 ENCOUNTER — Emergency Department (HOSPITAL_BASED_OUTPATIENT_CLINIC_OR_DEPARTMENT_OTHER)
Admission: EM | Admit: 2012-10-19 | Discharge: 2012-10-19 | Disposition: A | Discharge status: Home / Self Care | Payer: 59 | Attending: Emergency Medicine | Admitting: Emergency Medicine

## 2012-10-19 ENCOUNTER — Ambulatory Visit (HOSPITAL_BASED_OUTPATIENT_CLINIC_OR_DEPARTMENT_OTHER)
Admit: 2012-10-19 | Discharge: 2012-10-19 | Disposition: A | Discharge status: Home / Self Care | Payer: 59 | Attending: Emergency Medicine | Admitting: Emergency Medicine

## 2012-10-19 DIAGNOSIS — Z8719 Personal history of other diseases of the digestive system: Secondary | ICD-10-CM | POA: Insufficient documentation

## 2012-10-19 DIAGNOSIS — M79609 Pain in unspecified limb: Secondary | ICD-10-CM | POA: Insufficient documentation

## 2012-10-19 DIAGNOSIS — D563 Thalassemia minor: Secondary | ICD-10-CM | POA: Insufficient documentation

## 2012-10-19 DIAGNOSIS — M79662 Pain in left lower leg: Secondary | ICD-10-CM

## 2012-10-19 DIAGNOSIS — Z79899 Other long term (current) drug therapy: Secondary | ICD-10-CM | POA: Insufficient documentation

## 2012-10-19 LAB — D-DIMER, QUANTITATIVE: D-Dimer, Quant: <0.27 ug/mL-FEU (ref 0.00–0.48)

## 2012-10-19 NOTE — ED Notes (Signed)
Patient here with ongoing left calf pain x 4 days. Denies trauma, pain worse with ambulation. Patient reports that it feels tight. On BCP and had baby 4 months ago,

## 2012-10-19 NOTE — ED Provider Notes (Signed)
History     CSN: [id number]  Arrival date & time 10/19/12  0209   First MD Initiated Contact with Patient 10/19/12 0357      Chief Complaint  Patient presents with  . Leg Pain    (Consider location/radiation/quality/duration/timing/severity/associated sxs/prior treatment) HPI Is a 37 year old female who is about 4 months postpartum and is on low-dose progesterone. She is here with left calf pain for the past 3-4 days. The pain is mild to moderate and worse with ambulation or flexion of the calf muscles. There is no associated swelling, erythema or warmth. She denies chest pain or shortness of breath. She has never had a DVT. She denies any injury. She describes the pain as a tightness.  Past Medical History  Diagnosis Date  . Crohn's disease   . Vaginal bleeding in pregnancy - 23 wks/?PPROM 03/02/2012  . Beta thalassemia minor     Past Surgical History  Procedure Laterality Date  . Appendectomy    . Small intestine surgery      1 ft removed    Family History  Problem Relation Age of Onset  . Cancer Mother     breast  . Hyperlipidemia Mother   . Hypertension Mother   . Hypertension Father   . Hypertension Brother   . Hypertension Maternal Grandmother   . Hyperlipidemia Maternal Grandfather   . Hypertension Maternal Grandfather   . Diabetes Paternal Grandmother   . Cancer Paternal Grandmother     breast  . Diabetes Paternal Grandfather     History  Substance Use Topics  . Smoking status: Never Smoker   . Smokeless tobacco: Not on file  . Alcohol Use: No    OB History   Grav Para Term Preterm Abortions TAB SAB Ect Mult Living   2 2 2  0 0 0 0 0 0 2      Review of Systems  All other systems reviewed and are negative.    Allergies  Review of patient's allergies indicates no known allergies.  Home Medications   Current Outpatient Rx  Name  Route  Sig  Dispense  Refill  . ibuprofen (ADVIL,MOTRIN) 600 MG tablet   Oral   Take 1 tablet (600 mg total) by  mouth every 6 (six) hours as needed for pain.   30 tablet   2   . loratadine (CLARITIN) 10 MG tablet   Oral   Take 10 mg by mouth daily.         . Prenatal Vit-Fe Fumarate-FA (PRENATAL MULTIVITAMIN) TABS   Oral   Take 1 tablet by mouth at bedtime.          . valACYclovir (VALTREX) 500 MG tablet   Oral   Take 500 mg by mouth daily.            BP 121/87  Pulse 59  Temp(Src) 99.1 F (37.3 C) (Oral)  Resp 18  Wt 130 lb (58.968 kg)  BMI 23.03 kg/m2  SpO2 100%  Physical Exam General: Well-developed, well-nourished female in no acute distress; appearance consistent with age of record HENT: normocephalic, atraumatic Eyes: pupils equal round and reactive to light; extraocular muscles intact Neck: supple Heart: regular rate and rhythm Lungs: clear to auscultation bilaterally Abdomen: soft; nondistended; nontender; no masses or hepatosplenomegaly; bowel sounds present Extremities: No deformity; full range of motion; pulses normal; mild left calf tenderness medially without palpable cord Neurologic: Awake, alert and oriented; motor function intact in all extremities and symmetric; no facial droop Skin: Warm and  dry Psychiatric: Normal mood and affect    ED Course  Procedures (including critical care time)     MDM   Nursing notes and vitals signs, including pulse oximetry, reviewed.  Summary of this visit's results, reviewed by myself:  Labs:  Results for orders placed during the hospital encounter of 10/19/12 (from the past 24 hour(s))  D-DIMER, QUANTITATIVE     Status: None   Collection Time    10/19/12  3:23 AM      Result Value Range   D-Dimer, Quant <0.27  0.00 - 0.48 ug/mL-FEU   Well's score for DVT 1 (low risk). We will obtain outpatient Doppler but not start anticoagulation at this time.         Wynetta Fines, MD 10/19/12 [phone]

## 2012-10-19 NOTE — ED Notes (Signed)
To have outpatient U/S today at 1000 to R/O DVT.

## 2012-11-02 ENCOUNTER — Telehealth: Payer: Self-pay | Admitting: Family

## 2012-11-02 ENCOUNTER — Encounter: Payer: Self-pay | Admitting: Family

## 2012-11-02 ENCOUNTER — Ambulatory Visit (INDEPENDENT_AMBULATORY_CARE_PROVIDER_SITE_OTHER): Payer: 59 | Admitting: Family

## 2012-11-02 VITALS — HR 59 | Wt 132.0 lb

## 2012-11-02 DIAGNOSIS — H109 Unspecified conjunctivitis: Secondary | ICD-10-CM

## 2012-11-02 DIAGNOSIS — J309 Allergic rhinitis, unspecified: Secondary | ICD-10-CM

## 2012-11-02 MED ORDER — NEOMYCIN-POLYMYXIN-PRED ACET 0.5 % OP SUSP: 1.0000 [drp] | OPHTHALMIC | Status: DC

## 2012-11-02 MED ORDER — ALPRAZOLAM 0.5 MG PO TABS: 0.5000 mg | ORAL_TABLET | ORAL | Status: DC | PRN

## 2012-11-02 MED ORDER — GENTAMICIN-PREDNISOLONE ACET 0.3-1 % OP SUSP: 1.0000 [drp] | Freq: Two times a day (BID) | OPHTHALMIC | Status: DC

## 2012-11-02 NOTE — Patient Instructions (Addendum)

## 2012-11-02 NOTE — Progress Notes (Signed)
Subjective:    Patient ID: Susan Montoya, female    DOB: 1975/09/03, 37 y.o.   MRN: [id number]  HPI 37 year old female, nonsmoker is in today with complaints of a red, watery, right eye x2 days. Denies any matting or crusting. Reports it's been somewhat itchy and has been trying an over-the-counter allergy eyedrops that is helped some but her symptoms persist. Denies any sneezing, cough, or congestion.   Review of Systems  Constitutional: Negative.   HENT: Negative.   Eyes: Positive for pain, discharge and redness. Negative for photophobia and visual disturbance.  Respiratory: Negative.   Cardiovascular: Negative.   Skin: Negative.   Allergic/Immunologic: Positive for environmental allergies. Negative for food allergies.  Neurological: Negative.   Psychiatric/Behavioral: Negative.    Past Medical History  Diagnosis Date  . Crohn's disease   . Vaginal bleeding in pregnancy - 23 wks/?PPROM 03/02/2012  . Beta thalassemia minor     History   Social History  . Marital Status: Divorced    Spouse Name: N/A    Number of Children: N/A  . Years of Education: N/A   Occupational History  . Not on file.   Social History Main Topics  . Smoking status: Never Smoker   . Smokeless tobacco: Not on file  . Alcohol Use: No  . Drug Use: No  . Sexually Active: Yes   Other Topics Concern  . Not on file   Social History Narrative  . No narrative on file    Past Surgical History  Procedure Laterality Date  . Appendectomy    . Small intestine surgery      1 ft removed    Family History  Problem Relation Age of Onset  . Cancer Mother     breast  . Hyperlipidemia Mother   . Hypertension Mother   . Hypertension Father   . Hypertension Brother   . Hypertension Maternal Grandmother   . Hyperlipidemia Maternal Grandfather   . Hypertension Maternal Grandfather   . Diabetes Paternal Grandmother   . Cancer Paternal Grandmother     breast  . Diabetes Paternal Grandfather     No  Known Allergies  Current Outpatient Prescriptions on File Prior to Visit  Medication Sig Dispense Refill  . ibuprofen (ADVIL,MOTRIN) 600 MG tablet Take 1 tablet (600 mg total) by mouth every 6 (six) hours as needed for pain.  30 tablet  2  . loratadine (CLARITIN) 10 MG tablet Take 10 mg by mouth daily.      . Prenatal Vit-Fe Fumarate-FA (PRENATAL MULTIVITAMIN) TABS Take 1 tablet by mouth at bedtime.       . valACYclovir (VALTREX) 500 MG tablet Take 500 mg by mouth daily.        No current facility-administered medications on file prior to visit.    Pulse 59  Wt 132 lb (59.875 kg)  BMI 23.39 kg/m2  SpO2 99%chart    Objective:   Physical Exam  Constitutional: She is oriented to person, place, and time. She appears well-developed and well-nourished.  HENT:  Right Ear: External ear normal.  Left Ear: External ear normal.  Nose: Nose normal.  Mouth/Throat: Oropharynx is clear and moist.  Eyes: Pupils are equal, round, and reactive to light. Right eye exhibits no discharge. Left eye exhibits no discharge.  Right conjunctiva pink. No matting or crusting.   Neck: Normal range of motion. Neck supple.  Cardiovascular: Normal rate, regular rhythm and normal heart sounds.   Pulmonary/Chest: Effort normal and breath sounds normal.  Neurological:  She is oriented to person, place, and time.  Skin: Skin is warm and dry.  Psychiatric: She has a normal mood and affect.          Assessment & Plan:  Assessment: 1. Viral conjunctivitis 2. Allergic rhinitis  Plan: Pred-G eyedrop as discussed. Patient call the office if symptoms worsen or persist. Recheck a schedule, and as needed.

## 2012-11-02 NOTE — Telephone Encounter (Signed)
done

## 2012-11-02 NOTE — Telephone Encounter (Signed)
[facility] called, stated they just received an e-scribe for neomycin-polymyxin-prednisoLONE (POLY-PRED) 0.5 % ophthalmic solution However, that med has been discontinued. Please send in new rx for replacement med. Thanks.

## 2012-11-11 ENCOUNTER — Telehealth: Payer: Self-pay | Admitting: Family

## 2012-11-11 MED ORDER — NEOMYCIN-POLYMYXIN-DEXAMETH 0.1 % OP OINT: 1.0000 "application " | TOPICAL_OINTMENT | Freq: Every day | OPHTHALMIC | Status: DC

## 2012-11-11 NOTE — Telephone Encounter (Signed)
Spoke with pharmacist, Suezanne Jacquet, to get suggestions on an alternative gtt. He suggested neomycin-polymyxin-dexamethasone stating that it should be just $4.  Spoke with [place] and she said it is ok to change gtt.  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  Pt aware gtt changed and cost should be just $4

## 2012-11-11 NOTE — Telephone Encounter (Signed)
Pt needs note to excuse from work today and tomorrow.   Note faxed to [phone] and confirmed receipt by pt

## 2012-11-11 NOTE — Telephone Encounter (Signed)
PT called and stated that she needs a work note for her pink eye. She also would like to speak with you regarding her limitations and if she contagious. Please assist.

## 2012-11-11 NOTE — Telephone Encounter (Signed)
Pt states Pharm had to order the eye drops that was rx'd for her eye, and now that they are in, they are $50. Pt would like to know if there is a generic that can be ordered for her? Pt states her eye is getting worse and very red now. Pls advise.  [facility]

## 2012-11-16 ENCOUNTER — Encounter: Payer: Self-pay | Admitting: Family

## 2012-12-30 ENCOUNTER — Encounter: Payer: Self-pay | Admitting: Family

## 2012-12-30 ENCOUNTER — Ambulatory Visit (INDEPENDENT_AMBULATORY_CARE_PROVIDER_SITE_OTHER): Payer: 59 | Admitting: Family

## 2012-12-30 VITALS — BP 98/60 | HR 64 | Wt 129.0 lb

## 2012-12-30 DIAGNOSIS — R1011 Right upper quadrant pain: Secondary | ICD-10-CM

## 2012-12-30 DIAGNOSIS — M549 Dorsalgia, unspecified: Secondary | ICD-10-CM

## 2012-12-30 DIAGNOSIS — R0789 Other chest pain: Secondary | ICD-10-CM

## 2012-12-30 DIAGNOSIS — R071 Chest pain on breathing: Secondary | ICD-10-CM

## 2012-12-30 LAB — HEPATIC FUNCTION PANEL
ALT: 12 U/L (ref 0–35)
Albumin: 4.3 g/dL (ref 3.5–5.2)
Alkaline Phosphatase: 70 U/L (ref 39–117)
Bilirubin, Direct: 0.1 mg/dL (ref 0.0–0.3)
Total Protein: 7.7 g/dL (ref 6.0–8.3)

## 2012-12-30 LAB — COMPREHENSIVE METABOLIC PANEL
ALT: 12 U/L (ref 0–35)
AST: 17 U/L (ref 0–37)
Alkaline Phosphatase: 70 U/L (ref 39–117)
BUN: 11 mg/dL (ref 6–23)
Chloride: 104 mEq/L (ref 96–112)
Creatinine, Ser: 0.7 mg/dL (ref 0.4–1.2)

## 2012-12-30 LAB — CBC WITH DIFFERENTIAL/PLATELET
Basophils Absolute: 0 10*3/uL (ref 0.0–0.1)
Basophils Relative: 0.3 % (ref 0.0–3.0)
Eosinophils Absolute: 0.1 10*3/uL (ref 0.0–0.7)
HCT: 33 % — ABNORMAL LOW (ref 36.0–46.0)
Hemoglobin: 10.5 g/dL — ABNORMAL LOW (ref 12.0–15.0)
Lymphocytes Relative: 24.2 % (ref 12.0–46.0)
Lymphs Abs: 1.6 10*3/uL (ref 0.7–4.0)
MCHC: 31.7 g/dL (ref 30.0–36.0)
Monocytes Relative: 6.1 % (ref 3.0–12.0)
Neutro Abs: 4.6 10*3/uL (ref 1.4–7.7)
RBC: 5.17 Mil/uL — ABNORMAL HIGH (ref 3.87–5.11)
RDW: 16 % — ABNORMAL HIGH (ref 11.5–14.6)

## 2012-12-30 NOTE — Progress Notes (Signed)
Subjective:    Patient ID: Susan Montoya, female    DOB: 16-Jul-1975, 37 y.o.   MRN: [id number]  HPI 37 year old female, nonsmoker presents today with complaints of right lower abdominal pain, pain in her chest and back ongoing times a week and a half. She describes it as soreness, but improving. Tender to touch. Pain is worse with bending forward. Has been taking Protonix and simethicone without relief. She is nursing. Has a history of intermittent anxiety and Crohn's disease. Denies any nausea, vomiting, diarrhea, constipation, blood in her stools, no chest pain or shortness of breath.   Review of Systems  Constitutional: Negative.   HENT: Negative.   Respiratory: Negative.   Cardiovascular: Negative.   Gastrointestinal: Positive for abdominal pain. Negative for nausea, diarrhea, constipation, blood in stool, abdominal distention and anal bleeding.  Endocrine: Negative.   Genitourinary: Negative.   Musculoskeletal: Positive for back pain. Negative for myalgias, joint swelling, arthralgias and gait problem.  Skin: Negative.   Neurological: Negative.   Hematological: Negative.   Psychiatric/Behavioral: Negative for suicidal ideas, sleep disturbance and agitation. The patient is nervous/anxious.    Past Medical History  Diagnosis Date  . Crohn's disease   . Vaginal bleeding in pregnancy - 23 wks/?PPROM 03/02/2012  . Beta thalassemia minor     History   Social History  . Marital Status: Divorced    Spouse Name: N/A    Number of Children: N/A  . Years of Education: N/A   Occupational History  . Not on file.   Social History Main Topics  . Smoking status: Never Smoker   . Smokeless tobacco: Not on file  . Alcohol Use: No  . Drug Use: No  . Sexual Activity: Yes   Other Topics Concern  . Not on file   Social History Narrative  . No narrative on file    Past Surgical History  Procedure Laterality Date  . Appendectomy    . Small intestine surgery      1 ft removed     Family History  Problem Relation Age of Onset  . Cancer Mother     breast  . Hyperlipidemia Mother   . Hypertension Mother   . Hypertension Father   . Hypertension Brother   . Hypertension Maternal Grandmother   . Hyperlipidemia Maternal Grandfather   . Hypertension Maternal Grandfather   . Diabetes Paternal Grandmother   . Cancer Paternal Grandmother     breast  . Diabetes Paternal Grandfather     No Known Allergies  Current Outpatient Prescriptions on File Prior to Visit  Medication Sig Dispense Refill  . ALPRAZolam (XANAX) 0.5 MG tablet Take 1 tablet (0.5 mg total) by mouth as needed.  30 tablet  2  . ibuprofen (ADVIL,MOTRIN) 600 MG tablet Take 1 tablet (600 mg total) by mouth every 6 (six) hours as needed for pain.  30 tablet  2  . loratadine (CLARITIN) 10 MG tablet Take 10 mg by mouth daily.      Marland Kitchen neomycin-polymyxin-dexameth (MAXITROL) 0.1 % OINT Place 1 application into the right eye at bedtime. Until resolved  3.5 g  0  . Prenatal Vit-Fe Fumarate-FA (PRENATAL MULTIVITAMIN) TABS Take 1 tablet by mouth at bedtime.       . valACYclovir (VALTREX) 500 MG tablet Take 500 mg by mouth daily.        No current facility-administered medications on file prior to visit.    BP 98/60  Pulse 64  Wt 129 lb (58.514 kg)  BMI  22.86 kg/m2chart    Objective:   Physical Exam  Constitutional: She is oriented to person, place, and time. She appears well-developed and well-nourished.  Neck: Normal range of motion. Neck supple. No thyromegaly present.  Cardiovascular: Normal rate, regular rhythm and normal heart sounds.   Pulmonary/Chest: Effort normal and breath sounds normal. She exhibits no tenderness.  Abdominal: Soft. Bowel sounds are normal. There is tenderness.  Tenderness to palpation to the right of the umbilicus.  Musculoskeletal: Normal range of motion.  No back or chest wall pain to palpation.   Neurological: She is alert and oriented to person, place, and time.  Skin:  Skin is warm and dry.  Psychiatric: She has a normal mood and affect.          Assessment & Plan:  Assessment: 1. Right lower, pain 2. Chest tenderness 3. Thoracic back pain  Plan: The patient feels her symptoms all began at the same time but don't appear to be related at all. However, we'll send CBC, sed rate, CMP and the patient and the results. We'll discuss further treatment plan at that time if any.

## 2013-08-02 ENCOUNTER — Telehealth: Payer: Self-pay | Admitting: Family

## 2013-08-02 NOTE — Telephone Encounter (Signed)
Pt is needing new rx for ALPRAZolam (XANAX) 0.5 MG tablet, [facility], pt states she had refills but they expired in jan.

## 2013-08-03 MED ORDER — ALPRAZOLAM 0.5 MG PO TABS: 0.5000 mg | ORAL_TABLET | ORAL | Status: DC | PRN

## 2013-08-03 NOTE — Telephone Encounter (Signed)
Rx faxed

## 2013-10-01 ENCOUNTER — Encounter (HOSPITAL_BASED_OUTPATIENT_CLINIC_OR_DEPARTMENT_OTHER): Payer: Self-pay | Admitting: *Deleted

## 2013-10-07 NOTE — H&P (Signed)
PREOPERATIVE H&P  Chief Complaint: chronic tonsil problems  HPI: Susan Montoya is a 38 y.o. female who presents for evaluation of frequent tonsil infections and frequent tonsil "stones". On exam she has 2+ tonsils but complains of frequent sore throats and tonsil infections. She's taken to the OR for tonsillectomy.  Past Medical History  Diagnosis Date  . Crohn's disease     no current med.  . Beta thalassemia minor   . GERD (gastroesophageal reflux disease)     occasional  . Chronic tonsillitis 09/2013  . TMJ syndrome     right side   Past Surgical History  Procedure Laterality Date  . Appendectomy    . Colon resection  age 61    removal of 1 ft. large and small intestine   History   Social History  . Marital Status: Divorced    Spouse Name: N/A    Number of Children: N/A  . Years of Education: N/A   Social History Main Topics  . Smoking status: Never Smoker   . Smokeless tobacco: Never Used  . Alcohol Use: Yes     Comment: occasionally  . Drug Use: No  . Sexual Activity: Yes   Other Topics Concern  . None   Social History Narrative  . None   Family History  Problem Relation Age of Onset  . Cancer Mother     breast  . Hyperlipidemia Mother   . Hypertension Mother   . Hypertension Father   . Hypertension Brother   . Hypertension Maternal Grandmother   . Hyperlipidemia Maternal Grandfather   . Hypertension Maternal Grandfather   . Diabetes Paternal Grandmother   . Cancer Paternal Grandmother     breast  . Diabetes Paternal Grandfather    No Known Allergies Prior to Admission medications   Medication Sig Start Date End Date Taking? Authorizing Provider  ALPRAZolam Duanne Moron) 0.5 MG tablet Take 1 tablet (0.5 mg total) by mouth as needed. 08/03/13  Yes Timoteo Gaul, FNP  cholecalciferol (VITAMIN D) 1000 UNITS tablet Take 1,000 Units by mouth daily.   Yes Historical Provider, MD  loratadine (CLARITIN) 10 MG tablet Take 10 mg by mouth daily.   Yes  Historical Provider, MD  naproxen (NAPROSYN) 250 MG tablet Take by mouth 2 (two) times daily with a meal.   Yes Historical Provider, MD  pantoprazole (PROTONIX) 40 MG tablet Take 40 mg by mouth daily.   Yes Historical Provider, MD  Prenatal Vit-Fe Fumarate-FA (PRENATAL MULTIVITAMIN) TABS Take 1 tablet by mouth at bedtime.    Yes Historical Provider, MD     Positive ROS: per HPI  All other systems have been reviewed and were otherwise negative with the exception of those mentioned in the HPI and as above.  Physical Exam: There were no vitals filed for this visit.  General: Alert, no acute distress Oral: Normal oral mucosa and 2+ tonsils with several tonsillar crypts Nasal: Clear nasal passages Neck: No palpable adenopathy or thyroid nodules Ear: Ear canal is clear with normal appearing TMs Cardiovascular: Regular rate and rhythm, no murmur.  Respiratory: Clear to auscultation Neurologic: Alert and oriented x 3   Assessment/Plan: chronic tonsillitis Plan for Procedure(s): TONSILLECTOMY   Melony Overly, MD 10/07/2013 9:41 AM

## 2013-10-08 ENCOUNTER — Ambulatory Visit (HOSPITAL_BASED_OUTPATIENT_CLINIC_OR_DEPARTMENT_OTHER): Payer: 59 | Admitting: Anesthesiology

## 2013-10-08 ENCOUNTER — Encounter (HOSPITAL_BASED_OUTPATIENT_CLINIC_OR_DEPARTMENT_OTHER)
Admission: RE | Disposition: A | Discharge status: Home / Self Care | Payer: Self-pay | Source: Ambulatory Visit | Attending: Otolaryngology

## 2013-10-08 ENCOUNTER — Ambulatory Visit (HOSPITAL_BASED_OUTPATIENT_CLINIC_OR_DEPARTMENT_OTHER)
Admission: RE | Admit: 2013-10-08 | Discharge: 2013-10-08 | Disposition: A | Discharge status: Home / Self Care | Payer: 59 | Source: Ambulatory Visit | Attending: Otolaryngology | Admitting: Otolaryngology

## 2013-10-08 ENCOUNTER — Encounter (HOSPITAL_BASED_OUTPATIENT_CLINIC_OR_DEPARTMENT_OTHER): Payer: Self-pay | Admitting: Anesthesiology

## 2013-10-08 ENCOUNTER — Encounter (HOSPITAL_BASED_OUTPATIENT_CLINIC_OR_DEPARTMENT_OTHER): Payer: 59 | Admitting: Anesthesiology

## 2013-10-08 DIAGNOSIS — J3501 Chronic tonsillitis: Secondary | ICD-10-CM | POA: Insufficient documentation

## 2013-10-08 DIAGNOSIS — Z79899 Other long term (current) drug therapy: Secondary | ICD-10-CM | POA: Insufficient documentation

## 2013-10-08 DIAGNOSIS — D649 Anemia, unspecified: Secondary | ICD-10-CM | POA: Insufficient documentation

## 2013-10-08 DIAGNOSIS — K219 Gastro-esophageal reflux disease without esophagitis: Secondary | ICD-10-CM | POA: Insufficient documentation

## 2013-10-08 HISTORY — DX: Arthralgia of temporomandibular joint, unspecified side: M26.629

## 2013-10-08 HISTORY — DX: Chronic tonsillitis: J35.01

## 2013-10-08 HISTORY — DX: Gastro-esophageal reflux disease without esophagitis: K21.9

## 2013-10-08 HISTORY — PX: TONSILLECTOMY: SHX5217

## 2013-10-08 LAB — POCT HEMOGLOBIN-HEMACUE: Hemoglobin: 11.1 g/dL — ABNORMAL LOW (ref 12.0–15.0)

## 2013-10-08 SURGERY — TONSILLECTOMY
Anesthesia: General

## 2013-10-08 MED ORDER — OXYCODONE HCL 5 MG PO TABS: 5.0000 mg | ORAL_TABLET | Freq: Once | ORAL | Status: AC | PRN

## 2013-10-08 MED ORDER — CEFAZOLIN SODIUM-DEXTROSE 2-3 GM-% IV SOLR
INTRAVENOUS | Status: AC
  Filled 2013-10-08: qty 50

## 2013-10-08 MED ORDER — DEXAMETHASONE SODIUM PHOSPHATE 4 MG/ML IJ SOLN
INTRAMUSCULAR | Status: DC | PRN
  Administered 2013-10-08: 10 mg via INTRAVENOUS

## 2013-10-08 MED ORDER — HYDROMORPHONE HCL PF 1 MG/ML IJ SOLN
INTRAMUSCULAR | Status: AC
  Filled 2013-10-08: qty 1

## 2013-10-08 MED ORDER — MIDAZOLAM HCL 2 MG/ML PO SYRP: 12.0000 mg | ORAL_SOLUTION | Freq: Once | ORAL | Status: DC | PRN

## 2013-10-08 MED ORDER — CEFAZOLIN SODIUM-DEXTROSE 2-3 GM-% IV SOLR
INTRAVENOUS | Status: DC | PRN
  Administered 2013-10-08: 2 g via INTRAVENOUS

## 2013-10-08 MED ORDER — LACTATED RINGERS IV SOLN
INTRAVENOUS | Status: DC
Start: 2013-10-08 — End: 2013-10-08
  Administered 2013-10-08: 10 mL/h via INTRAVENOUS
  Administered 2013-10-08: 08:00:00 via INTRAVENOUS

## 2013-10-08 MED ORDER — SUCCINYLCHOLINE CHLORIDE 20 MG/ML IJ SOLN
INTRAMUSCULAR | Status: AC
  Filled 2013-10-08: qty 1

## 2013-10-08 MED ORDER — MIDAZOLAM HCL 2 MG/2ML IJ SOLN
INTRAMUSCULAR | Status: AC
  Filled 2013-10-08: qty 2

## 2013-10-08 MED ORDER — ONDANSETRON HCL 4 MG/2ML IJ SOLN
INTRAMUSCULAR | Status: DC | PRN
  Administered 2013-10-08: 4 mg via INTRAVENOUS

## 2013-10-08 MED ORDER — FENTANYL CITRATE 0.05 MG/ML IJ SOLN
INTRAMUSCULAR | Status: DC | PRN
  Administered 2013-10-08: 100 ug via INTRAVENOUS

## 2013-10-08 MED ORDER — 0.9 % SODIUM CHLORIDE (POUR BTL) OPTIME
TOPICAL | Status: DC | PRN
  Administered 2013-10-08: 100 mL

## 2013-10-08 MED ORDER — MIDAZOLAM HCL 2 MG/2ML IJ SOLN: 1.0000 mg | INTRAMUSCULAR | Status: DC | PRN

## 2013-10-08 MED ORDER — HYDROMORPHONE HCL PF 1 MG/ML IJ SOLN
0.2500 mg | INTRAMUSCULAR | Status: DC | PRN
  Administered 2013-10-08 (×2): 0.25 mg via INTRAVENOUS
  Administered 2013-10-08 (×2): 0.5 mg via INTRAVENOUS

## 2013-10-08 MED ORDER — HYDROCODONE-ACETAMINOPHEN 7.5-325 MG/15ML PO SOLN: 10.0000 mL | Freq: Four times a day (QID) | ORAL | Status: DC | PRN

## 2013-10-08 MED ORDER — AMOXICILLIN 400 MG/5ML PO SUSR: 600.0000 mg | Freq: Two times a day (BID) | ORAL | Status: DC

## 2013-10-08 MED ORDER — METOCLOPRAMIDE HCL 5 MG/ML IJ SOLN: 10.0000 mg | Freq: Once | INTRAMUSCULAR | Status: DC | PRN

## 2013-10-08 MED ORDER — MIDAZOLAM HCL 5 MG/5ML IJ SOLN
INTRAMUSCULAR | Status: DC | PRN
  Administered 2013-10-08: 2 mg via INTRAVENOUS

## 2013-10-08 MED ORDER — SUCCINYLCHOLINE CHLORIDE 20 MG/ML IJ SOLN
INTRAMUSCULAR | Status: DC | PRN
  Administered 2013-10-08: 80 mg via INTRAVENOUS

## 2013-10-08 MED ORDER — FENTANYL CITRATE 0.05 MG/ML IJ SOLN: 50.0000 ug | INTRAMUSCULAR | Status: DC | PRN

## 2013-10-08 MED ORDER — FENTANYL CITRATE 0.05 MG/ML IJ SOLN
INTRAMUSCULAR | Status: AC
  Filled 2013-10-08: qty 6

## 2013-10-08 MED ORDER — ONDANSETRON HCL 4 MG/5ML PO SOLN: 4.0000 mg | Freq: Three times a day (TID) | ORAL | Status: DC | PRN

## 2013-10-08 MED ORDER — OXYCODONE HCL 5 MG/5ML PO SOLN
ORAL | Status: AC
  Filled 2013-10-08: qty 5

## 2013-10-08 MED ORDER — LIDOCAINE HCL (CARDIAC) 20 MG/ML IV SOLN
INTRAVENOUS | Status: DC | PRN
  Administered 2013-10-08: 50 mg via INTRAVENOUS

## 2013-10-08 MED ORDER — OXYCODONE HCL 5 MG/5ML PO SOLN
5.0000 mg | Freq: Once | ORAL | Status: AC | PRN
  Administered 2013-10-08: 5 mg via ORAL

## 2013-10-08 SURGICAL SUPPLY — 30 items
BANDAGE COBAN STERILE 2 (GAUZE/BANDAGES/DRESSINGS) IMPLANT
CANISTER SUCT 1200ML W/VALVE (MISCELLANEOUS) ×2 IMPLANT
CATH ROBINSON RED A/P 12FR (CATHETERS) ×1 IMPLANT
CATH ROBINSON RED A/P 14FR (CATHETERS) IMPLANT
COAGULATOR SUCT SWTCH 10FR 6 (ELECTROSURGICAL) IMPLANT
COVER MAYO STAND STRL (DRAPES) ×2 IMPLANT
ELECT COATED BLADE 2.86 ST (ELECTRODE) ×2 IMPLANT
ELECT REM PT RETURN 9FT ADLT (ELECTROSURGICAL) ×2
ELECT REM PT RETURN 9FT PED (ELECTROSURGICAL)
ELECTRODE REM PT RETRN 9FT PED (ELECTROSURGICAL) IMPLANT
ELECTRODE REM PT RTRN 9FT ADLT (ELECTROSURGICAL) IMPLANT
GLOVE BIO SURGEON STRL SZ7 (GLOVE) ×1 IMPLANT
GLOVE BIOGEL PI IND STRL 7.0 (GLOVE) IMPLANT
GLOVE BIOGEL PI INDICATOR 7.0 (GLOVE) ×1
GLOVE ECLIPSE 6.5 STRL STRAW (GLOVE) ×1 IMPLANT
GLOVE SS BIOGEL STRL SZ 7.5 (GLOVE) ×1 IMPLANT
GLOVE SUPERSENSE BIOGEL SZ 7.5 (GLOVE) ×1
GOWN STRL REUS W/ TWL LRG LVL3 (GOWN DISPOSABLE) IMPLANT
GOWN STRL REUS W/TWL LRG LVL3 (GOWN DISPOSABLE) ×4
MARKER SKIN DUAL TIP RULER LAB (MISCELLANEOUS) IMPLANT
NS IRRIG 1000ML POUR BTL (IV SOLUTION) ×2 IMPLANT
PENCIL FOOT CONTROL (ELECTRODE) ×2 IMPLANT
SHEET MEDIUM DRAPE 40X70 STRL (DRAPES) ×2 IMPLANT
SOLUTION BUTLER CLEAR DIP (MISCELLANEOUS) ×1 IMPLANT
SPONGE GAUZE 4X4 12PLY STER LF (GAUZE/BANDAGES/DRESSINGS) ×2 IMPLANT
SPONGE TONSIL 1 RF SGL (DISPOSABLE) IMPLANT
SPONGE TONSIL 1.25 RF SGL STRG (GAUZE/BANDAGES/DRESSINGS) IMPLANT
SYR BULB 3OZ (MISCELLANEOUS) ×2 IMPLANT
TOWEL OR 17X24 6PK STRL BLUE (TOWEL DISPOSABLE) ×2 IMPLANT
TUBE CONNECTING 20X1/4 (TUBING) ×2 IMPLANT

## 2013-10-08 NOTE — Transfer of Care (Signed)
Immediate Anesthesia Transfer of Care Note  Patient: Susan Montoya  Procedure(s) Performed: Procedure(s): TONSILLECTOMY (N/A)  Patient Location: PACU  Anesthesia Type:General  Level of Consciousness: awake and patient cooperative  Airway & Oxygen Therapy: Patient Spontanous Breathing and Patient connected to face mask oxygen  Post-op Assessment: Report given to PACU RN and Post -op Vital signs reviewed and stable  Post vital signs: Reviewed and stable  Complications: No apparent anesthesia complications

## 2013-10-08 NOTE — Interval H&P Note (Signed)
History and Physical Interval Note:  10/08/2013 8:29 AM  Susan Montoya  has presented today for surgery, with the diagnosis of Chronic tonsillitis  The various methods of treatment have been discussed with the patient and family. After consideration of risks, benefits and other options for treatment, the patient has consented to  Procedure(s): TONSILLECTOMY (N/A) as a surgical intervention .  The patient's history has been reviewed, patient examined, no change in status, stable for surgery.  I have reviewed the patient's chart and labs.  Questions were answered to the patient's satisfaction.     Cordelro Gautreau

## 2013-10-08 NOTE — OR Nursing (Signed)
Pre-procedural time out documented in 'time out' sheet at 0916--performed at 0900.

## 2013-10-08 NOTE — Anesthesia Preprocedure Evaluation (Signed)
Anesthesia Evaluation  Patient identified by MRN, date of birth, ID band Patient awake    Reviewed: Allergy & Precautions, H&P , NPO status , Patient's Chart, lab work & pertinent test results, reviewed documented beta blocker date and time   Airway Mallampati: II TM Distance: >3 FB Neck ROM: full    Dental   Pulmonary neg pulmonary ROS,  breath sounds clear to auscultation        Cardiovascular negative cardio ROS  Rhythm:regular     Neuro/Psych negative neurological ROS  negative psych ROS   GI/Hepatic Neg liver ROS, GERD-  Medicated and Controlled,  Endo/Other  negative endocrine ROS  Renal/GU negative Renal ROS  negative genitourinary   Musculoskeletal   Abdominal   Peds  Hematology  (+) Blood dyscrasia, anemia ,   Anesthesia Other Findings See surgeon's H&P   Reproductive/Obstetrics negative OB ROS                           Anesthesia Physical Anesthesia Plan  ASA: II  Anesthesia Plan: General   Post-op Pain Management:    Induction: Intravenous  Airway Management Planned: Oral ETT  Additional Equipment:   Intra-op Plan:   Post-operative Plan: Extubation in OR  Informed Consent: I have reviewed the patients History and Physical, chart, labs and discussed the procedure including the risks, benefits and alternatives for the proposed anesthesia with the patient or authorized representative who has indicated his/her understanding and acceptance.   Dental Advisory Given  Plan Discussed with: CRNA and Surgeon  Anesthesia Plan Comments:         Anesthesia Quick Evaluation

## 2013-10-08 NOTE — Anesthesia Postprocedure Evaluation (Signed)
Anesthesia Post Note  Patient: Susan Montoya  Procedure(s) Performed: Procedure(s) (LRB): TONSILLECTOMY (N/A)  Anesthesia type: General  Patient location: PACU  Post pain: Pain level controlled  Post assessment: Patient's Cardiovascular Status Stable  Last Vitals:  Filed Vitals:   10/08/13 1030  BP: 117/80  Pulse: 61  Temp:   Resp: 14    Post vital signs: Reviewed and stable  Level of consciousness: alert  Complications: No apparent anesthesia complications

## 2013-10-08 NOTE — Anesthesia Procedure Notes (Signed)
Procedure Name: Intubation Date/Time: 10/08/2013 9:07 AM Performed by: Melynda Ripple D Pre-anesthesia Checklist: Patient identified, Emergency Drugs available, Suction available and Patient being monitored Patient Re-evaluated:Patient Re-evaluated prior to inductionOxygen Delivery Method: Circle System Utilized Preoxygenation: Pre-oxygenation with 100% oxygen Intubation Type: IV induction Ventilation: Mask ventilation without difficulty Laryngoscope Size: Mac and 3 Grade View: Grade I Tube type: Oral Number of attempts: 1 Airway Equipment and Method: stylet and oral airway Placement Confirmation: ETT inserted through vocal cords under direct vision,  positive ETCO2 and breath sounds checked- equal and bilateral Secured at: 21 cm Tube secured with: Tape Dental Injury: Teeth and Oropharynx as per pre-operative assessment

## 2013-10-08 NOTE — Discharge Instructions (Signed)
°  Post Anesthesia Home Care Instructions  Activity: Get plenty of rest for the remainder of the day. A responsible adult should stay with you for 24 hours following the procedure.  For the next 24 hours, DO NOT: -Drive a car -Paediatric nurse -Drink alcoholic beverages -Take any medication unless instructed by your physician -Make any legal decisions or sign important papers.  Meals: Start with liquid foods such as gelatin or soup. Progress to regular foods as tolerated. Avoid greasy, spicy, heavy foods. If nausea and/or vomiting occur, drink only clear liquids until the nausea and/or vomiting subsides. Call your physician if vomiting continues.  Special Instructions/Symptoms: Your throat may feel dry or sore from the anesthesia or the breathing tube placed in your throat during surgery. If this causes discomfort, gargle with warm salt water. The discomfort should disappear within 24 hours. Instructions for Home Care After Tonsillectomy  First Day Home: Encourage fluid intake by frequently offering liquids, soup, ice cream jello, etc.  Drink several glasses of water.  Cooler fluids are best.  Avoid hot and highly seasoned foods.  Orange juice, grapefruit juice and tomato juice may cause stinging sensation because of their acidic content.    Second and Third Day Home: Continue liquids and add soft foods, (pudding, macaroni and cheese, mashed potatoes, soft scrambled eggs, etc.).  Make sure you drink plenty of liquids so you do not get dehydrated.  Fifth Thru Seventh Day Home: Gradually resume a normal diet, but avoid hot foods, potato chips, nuts, toast and crackers until 2 weeks after surgery.  General Instructions   No undue physical exertion or exercise for one week.  Children: Tylenol may be used for discomfort and/or fever.  Use as often as necessary within limits of the directions.  Adults: May spray throat with Chloroseptic or other topical anesthetic for discomfort and use  pain medication obtained by prescription as directed.    A slight fever (up to 101) is expected for the first the first couple of days.  Take Tylenol (or aspirin substitute) as directed.  Pain in ears is common after tonsillectomy.  It represents pain referred from the throat where the tonsils were removed.  There is usually nothing wrong with the ears in most cases.  Administer Tylenol as needed to control this pain.  White patches will form where the tonsils were removed.  This is perfectly normal.  They will disappear in one to two weeks.  Mouth odor may be notice during the healing stages.  Do not use aspirin for two weeks; it increases the possibility of bleeding.  In a very small percentage of people, there is some bleeding after five to six days.  If this happens, do not become excited, for the bleeding is usually light.  Be quiet, lie down, and spit the blood out gently.  Gargle the throat with ice water.  If the bleeding does not stop promptly, call the office [phone]), which answers 24 hours a day.  A follow up appointment should be made with Dr. Lucia Gaskins 10-14 days following surgery. Please call [phone] for the appointment time.  Take Amoxicillin 600 mg twice per day for 1 week Tylenol, motrin or hydrocodone elixir prn pain Zofran prn nausea

## 2013-10-08 NOTE — Brief Op Note (Signed)
10/08/2013  9:33 AM  PATIENT:  Susan Montoya  38 y.o. female  PRE-OPERATIVE DIAGNOSIS:  Chronic tonsillitis  POST-OPERATIVE DIAGNOSIS:  Chronic tonsillitis  PROCEDURE:  Procedure(s): TONSILLECTOMY (N/A)  SURGEON:  Surgeon(s) and Role:    * Rozetta Nunnery, MD - Primary  PHYSICIAN ASSISTANT:   ASSISTANTS: none   ANESTHESIA:   general  EBL:     BLOOD ADMINISTERED:none  DRAINS: none   LOCAL MEDICATIONS USED:  NONE  SPECIMEN:  No Specimen  DISPOSITION OF SPECIMEN:  N/A  COUNTS:  YES  TOURNIQUET:  * No tourniquets in log *  DICTATION: .Other Dictation: Dictation Number [id number]  PLAN OF CARE: Discharge to home after PACU  PATIENT DISPOSITION:  PACU - hemodynamically stable.   Delay start of Pharmacological VTE agent (>24hrs) due to surgical blood loss or risk of bleeding: yes

## 2013-10-11 ENCOUNTER — Encounter (HOSPITAL_BASED_OUTPATIENT_CLINIC_OR_DEPARTMENT_OTHER): Payer: Self-pay | Admitting: Otolaryngology

## 2013-10-11 NOTE — Op Note (Signed)
[name], [name]                 ACCOUNT NO.:  [id number]  MEDICAL RECORD NO.:  [id number]  LOCATION:                               FACILITY:  [facility]  PHYSICIAN:  Leonides Sake. Lucia Gaskins, M.D.DATE OF BIRTH:  Aug 16, 1975  DATE OF PROCEDURE:  10/08/2013 DATE OF DISCHARGE:  10/08/2013                              OPERATIVE REPORT   PREOPERATIVE DIAGNOSIS:  Chronic cryptic tonsillitis.  POSTOPERATIVE DIAGNOSIS:  Chronic cryptic tonsillitis.  OPERATION PERFORMED:  Tonsillectomy.  SURGEON:  Leonides Sake. Lucia Gaskins, M.D.  ANESTHESIA:  General endotracheal.  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  On exam, she has average size 2+ tonsils which are very cryptic.  She was taken to the operating room time for tonsillectomy.  DESCRIPTION OF PROCEDURE:  After adequate endotracheal anesthesia, the patient received 1 g Ancef IV preoperatively as well as 10 mg Decadron. A mouth gag was used to expose the oropharynx.  The left and right tonsils were resected from tonsillar fossa using the cautery.  Care was taken to preserve the anterior and posterior tonsillar pillars as well as the uvula.  Hemostasis was obtained with cautery.  After obtaining adequate hemostasis, the oropharynx was irrigated with saline. Nasopharynx was examined and Kamilya had no significant adenoid tissue. This completed the procedure.  [name] was subsequent awoken from anesthesia and transferred to recovery room, postop doing well.  DISPOSITION:  She is discharged home later this morning on amoxicillin suspension 600 mg b.i.d. for 1 week and Tylenol and hydrocodone 5-7.5 mg q.6 hours p.r.n. pain.  We will have her follow up in my office in the 10-12 days for recheck.          ______________________________ Leonides Sake. Lucia Gaskins, M.D.     CEN/MEDQ  D:  10/08/2013  T:  10/08/2013  Job:   [id number]

## 2013-12-23 ENCOUNTER — Ambulatory Visit: Payer: 59 | Admitting: Physician Assistant

## 2013-12-23 ENCOUNTER — Ambulatory Visit (INDEPENDENT_AMBULATORY_CARE_PROVIDER_SITE_OTHER): Payer: 59 | Admitting: Physician Assistant

## 2013-12-23 ENCOUNTER — Encounter: Payer: Self-pay | Admitting: Physician Assistant

## 2013-12-23 VITALS — BP 108/70 | HR 74 | Temp 98.0°F | Resp 18 | Wt 120.8 lb

## 2013-12-23 DIAGNOSIS — J209 Acute bronchitis, unspecified: Secondary | ICD-10-CM

## 2013-12-23 NOTE — Progress Notes (Signed)
Pre visit review using our clinic review tool, if applicable. No additional management support is needed unless otherwise documented below in the visit note. 

## 2013-12-23 NOTE — Progress Notes (Deleted)
Assessment:     Plan:

## 2013-12-23 NOTE — Progress Notes (Signed)
Subjective:    Patient ID: Susan Montoya, female    DOB: 1975/11/17, 38 y.o.   MRN: [id number]  Cough This is a new problem. The current episode started in the past 7 days. The problem has been gradually worsening. The problem occurs every few minutes. The cough is productive of sputum. Associated symptoms include a fever (felt feverish at first, resolved.), headaches, a sore throat and shortness of breath (mild, durign coughing spells). Pertinent negatives include no chest pain, chills, ear congestion, ear pain, heartburn, hemoptysis, myalgias, nasal congestion, postnasal drip, rash, rhinorrhea, sweats, weight loss or wheezing. The symptoms are aggravated by lying down. Risk factors: works as Marine scientist in hospital, has been around many cases of pneumonia recently. Treatments tried: mucinex, tylenol, vicks vapor rub. The treatment provided mild relief. Her past medical history is significant for environmental allergies. There is no history of asthma or COPD.    Patient found out she is pregnant yesterday at OB/GYN.  Review of Systems  Constitutional: Positive for fever (felt feverish at first, resolved.). Negative for chills and weight loss.  HENT: Positive for congestion and sore throat. Negative for ear pain, postnasal drip, rhinorrhea and sinus pressure.   Respiratory: Positive for cough and shortness of breath (mild, durign coughing spells). Negative for hemoptysis and wheezing.   Cardiovascular: Negative for chest pain.  Gastrointestinal: Negative for heartburn, nausea, vomiting, abdominal pain and diarrhea.  Musculoskeletal: Negative for myalgias.  Skin: Negative for rash.  Allergic/Immunologic: Positive for environmental allergies.  Neurological: Positive for headaches. Negative for syncope.  All other systems reviewed and are negative.     Past Medical History  Diagnosis Date  . Crohn's disease     no current med.  . Beta thalassemia minor   . GERD (gastroesophageal reflux disease)      occasional  . Chronic tonsillitis 09/2013  . TMJ syndrome     right side    History   Social History  . Marital Status: Divorced    Spouse Name: N/A    Number of Children: N/A  . Years of Education: N/A   Occupational History  . Not on file.   Social History Main Topics  . Smoking status: Never Smoker   . Smokeless tobacco: Never Used  . Alcohol Use: Yes     Comment: occasionally  . Drug Use: No  . Sexual Activity: Yes   Other Topics Concern  . Not on file   Social History Narrative  . No narrative on file    Past Surgical History  Procedure Laterality Date  . Appendectomy    . Colon resection  age 80    removal of 1 ft. large and small intestine  . Tonsillectomy N/A 10/08/2013    Procedure: TONSILLECTOMY;  Surgeon: Rozetta Nunnery, MD;  Location: [facility];  Service: ENT;  Laterality: N/A;    Family History  Problem Relation Age of Onset  . Cancer Mother     breast  . Hyperlipidemia Mother   . Hypertension Mother   . Hypertension Father   . Hypertension Brother   . Hypertension Maternal Grandmother   . Hyperlipidemia Maternal Grandfather   . Hypertension Maternal Grandfather   . Diabetes Paternal Grandmother   . Cancer Paternal Grandmother     breast  . Diabetes Paternal Grandfather     No Known Allergies  Current Outpatient Prescriptions on File Prior to Visit  Medication Sig Dispense Refill  . Prenatal Vit-Fe Fumarate-FA (PRENATAL MULTIVITAMIN) TABS Take 1 tablet  by mouth at bedtime.       . ALPRAZolam (XANAX) 0.5 MG tablet Take 1 tablet (0.5 mg total) by mouth as needed.  30 tablet  1  . cholecalciferol (VITAMIN D) 1000 UNITS tablet Take 1,000 Units by mouth daily.      Marland Kitchen loratadine (CLARITIN) 10 MG tablet Take 10 mg by mouth daily.      . naproxen (NAPROSYN) 250 MG tablet Take by mouth 2 (two) times daily with a meal.      . ondansetron (ZOFRAN) 4 MG/5ML solution Take 5 mLs (4 mg total) by mouth every 8 (eight) hours as  needed for nausea or vomiting.  50 mL  0  . pantoprazole (PROTONIX) 40 MG tablet Take 40 mg by mouth daily.       No current facility-administered medications on file prior to visit.    EXAM: BP 108/70  Pulse 74  Temp(Src) 98 F (36.7 C) (Oral)  Resp 18  Wt 120 lb 12.8 oz (54.795 kg)  SpO2 98%  LMP 09/20/2013  Breastfeeding? No     Objective:   Physical Exam  Nursing note and vitals reviewed. Constitutional: She is oriented to person, place, and time. She appears well-developed and well-nourished. No distress.  HENT:  Head: Normocephalic and atraumatic.  Right Ear: External ear normal.  Left Ear: External ear normal.  Nose: Nose normal.  Mouth/Throat: Oropharynx is clear and moist. No oropharyngeal exudate.  Bilateral TMs normal. Bilateral frontal and maxillary sinuses non-TTP.  Eyes: Conjunctivae and EOM are normal. Pupils are equal, round, and reactive to light.  Neck: Normal range of motion. Neck supple.  Cardiovascular: Normal rate, regular rhythm and intact distal pulses.   Pulmonary/Chest: Effort normal and breath sounds normal. No stridor. No respiratory distress. She has no wheezes. She has no rales. She exhibits no tenderness.  Lymphadenopathy:    She has no cervical adenopathy.  Neurological: She is alert and oriented to person, place, and time.  Skin: Skin is warm and dry. No rash noted. She is not diaphoretic.  Psychiatric: She has a normal mood and affect. Her behavior is normal. Judgment and thought content normal.     Lab Results  Component Value Date   WBC 6.8 12/30/2012   HGB 11.1* 10/08/2013   HCT 33.0* 12/30/2012   PLT 244.0 12/30/2012   GLUCOSE 85 12/30/2012   ALT 12 12/30/2012   ALT 12 12/30/2012   AST 17 12/30/2012   AST 17 12/30/2012   NA 137 12/30/2012   K 3.9 12/30/2012   CL 104 12/30/2012   CREATININE 0.7 12/30/2012   BUN 11 12/30/2012   CO2 26 12/30/2012        Assessment & Plan:  Susan Montoya was seen today for cough.  Diagnoses and associated orders for this  visit:  Acute bronchitis, unspecified organism Comments: Pregnant, so will treat conservatively with Mucinex and tylenol. Rest, fluids, Watchful waiting.     Spoke with Dr. Burnice Logan regarding the patient, and I agree with him that seeing as the patient is pregnant and has only been sick for less than a week, we will try to continue conservative therapy with Mucinex and Tylenol for now with a large emphasis on fluid hydration and rest. The patient is amenable to this plan.  Return precautions provided, and patient handout on acute bronchitis.  Plan to follow up as needed, or for worsening or persistent symptoms despite treatment.  Patient Instructions  Continue to utilize Tylenol to help your fever symptoms.  He can also continue to take Mucinex to help bring up thick mucus.  Push fluid hydration with water.  Make sure to get adequate rest to help your recovery.  If emergency symptoms discussed during visit developed, seek medical attention immediately.  Followup as needed, or for worsening or persistent symptoms despite treatment.

## 2013-12-23 NOTE — Patient Instructions (Addendum)
Continue to utilize Tylenol to help your fever symptoms.  He can also continue to take Mucinex to help bring up thick mucus.  Push fluid hydration with water.  Make sure to get adequate rest to help your recovery.  If emergency symptoms discussed during visit developed, seek medical attention immediately.  Followup as needed, or for worsening or persistent symptoms despite treatment.    Acute Bronchitis Bronchitis is when the airways that extend from the windpipe into the lungs get red, puffy, and painful (inflamed). Bronchitis often causes thick spit (mucus) to develop. This leads to a cough. A cough is the most common symptom of bronchitis. In acute bronchitis, the condition usually begins suddenly and goes away over time (usually in 2 weeks). Smoking, allergies, and asthma can make bronchitis worse. Repeated episodes of bronchitis may cause more lung problems. HOME CARE  Rest.  Drink enough fluids to keep your pee (urine) clear or pale yellow (unless you need to limit fluids as told by your doctor).  Only take over-the-counter or prescription medicines as told by your doctor.  Avoid smoking and secondhand smoke. These can make bronchitis worse. If you are a smoker, think about using nicotine gum or skin patches. Quitting smoking will help your lungs heal faster.  Reduce the chance of getting bronchitis again by:  Washing your hands often.  Avoiding people with cold symptoms.  Trying not to touch your hands to your mouth, nose, or eyes.  Follow up with your doctor as told. GET HELP IF: Your symptoms do not improve after 1 week of treatment. Symptoms include:  Cough.  Fever.  Coughing up thick spit.  Body aches.  Chest congestion.  Chills.  Shortness of breath.  Sore throat. GET HELP RIGHT AWAY IF:   You have an increased fever.  You have chills.  You have severe shortness of breath.  You have bloody thick spit (sputum).  You throw up (vomit)  often.  You lose too much body fluid (dehydration).  You have a severe headache.  You faint. MAKE SURE YOU:   Understand these instructions.  Will watch your condition.  Will get help right away if you are not doing well or get worse. Document Released: 10/02/2007 Document Revised: 12/16/2012 Document Reviewed: 10/06/2012 [facility] Patient Information 2015 [place], [place]. This information is not intended to replace advice given to you by your health care provider. Make sure you discuss any questions you have with your health care provider.

## 2014-01-07 LAB — OB RESULTS CONSOLE RUBELLA ANTIBODY, IGM: RUBELLA: IMMUNE

## 2014-01-07 LAB — OB RESULTS CONSOLE GC/CHLAMYDIA
Chlamydia: NEGATIVE
Gonorrhea: NEGATIVE

## 2014-01-07 LAB — OB RESULTS CONSOLE RPR: RPR: NONREACTIVE

## 2014-01-07 LAB — OB RESULTS CONSOLE HEPATITIS B SURFACE ANTIGEN: Hepatitis B Surface Ag: NEGATIVE

## 2014-01-07 LAB — OB RESULTS CONSOLE ABO/RH: RH Type: POSITIVE

## 2014-01-07 LAB — OB RESULTS CONSOLE HIV ANTIBODY (ROUTINE TESTING): HIV: NONREACTIVE

## 2014-01-07 LAB — OB RESULTS CONSOLE ANTIBODY SCREEN: Antibody Screen: NEGATIVE

## 2014-02-28 ENCOUNTER — Encounter: Payer: Self-pay | Admitting: Physician Assistant

## 2014-04-29 NOTE — L&D Delivery Note (Signed)
Delivery Note At 9:49 PM a viable and healthy female was delivered via Vaginal, Spontaneous Delivery (Presentation: Left Occiput Anterior).  APGAR: 8, 9; weight 7 lb 3.9 oz (3285 g).   Placenta status: Intact, Spontaneous.   Cord: CAN x 3. 3 vessels with the following complications: Long.  Cord pH: none  Anesthesia: Epidural  Episiotomy: None Lacerations: None Suture Repair: none Est. Blood Loss (mL): 50  Mom to postpartum.  Baby to Couplet care / Skin to Skin.  Aquil Duhe A 08/17/2014, 9:16 AM

## 2014-07-28 LAB — OB RESULTS CONSOLE GBS: GBS: NEGATIVE

## 2014-08-07 ENCOUNTER — Encounter (HOSPITAL_COMMUNITY): Payer: Self-pay | Admitting: *Deleted

## 2014-08-07 ENCOUNTER — Inpatient Hospital Stay (HOSPITAL_COMMUNITY)
Admission: AD | Admit: 2014-08-07 | Discharge: 2014-08-07 | Disposition: A | Discharge status: Home / Self Care | Payer: 59 | Source: Ambulatory Visit | Attending: Obstetrics | Admitting: Obstetrics

## 2014-08-07 DIAGNOSIS — O479 False labor, unspecified: Secondary | ICD-10-CM | POA: Diagnosis present

## 2014-08-07 DIAGNOSIS — K5090 Crohn's disease, unspecified, without complications: Secondary | ICD-10-CM | POA: Insufficient documentation

## 2014-08-07 DIAGNOSIS — O368130 Decreased fetal movements, third trimester, not applicable or unspecified: Secondary | ICD-10-CM | POA: Diagnosis present

## 2014-08-07 DIAGNOSIS — K219 Gastro-esophageal reflux disease without esophagitis: Secondary | ICD-10-CM | POA: Diagnosis not present

## 2014-08-07 DIAGNOSIS — O4703 False labor before 37 completed weeks of gestation, third trimester: Secondary | ICD-10-CM

## 2014-08-07 DIAGNOSIS — O99613 Diseases of the digestive system complicating pregnancy, third trimester: Secondary | ICD-10-CM | POA: Diagnosis not present

## 2014-08-07 DIAGNOSIS — Z3A38 38 weeks gestation of pregnancy: Secondary | ICD-10-CM | POA: Diagnosis not present

## 2014-08-07 HISTORY — DX: Decreased fetal movements, third trimester, not applicable or unspecified: O36.8130

## 2014-08-07 NOTE — MAU Provider Note (Signed)
History     CSN: [id number]  Arrival date and time: 08/07/14 0142  Provider notified: 0235 Provider on unit: 0345 Provider at bedside: Susan Montoya     Chief Complaint  Patient presents with  . Decreased Fetal Movement  . Contractions   HPI  [name] is a 39 yo G3P2002 female at 38.[redacted] wks gestation presenting this morning with complaints of decreased fetal movement and frequent contractions since 2200. She checked the FHTs with a doppler at home and was able to get (+) FHTs, but not much movement.  Denies VB or LOF.  Last VE 1cm/80%. Scheduled for IOL on 08/16/2014.  She is GBS Negative.  Her primary OB provider at [facility] is Dr. Garwin Montoya.   Past Medical History  Diagnosis Date  . Crohn's disease     no current med.  . Beta thalassemia minor   . GERD (gastroesophageal reflux disease)     occasional  . Chronic tonsillitis 09/2013  . TMJ syndrome     right side    Past Surgical History  Procedure Laterality Date  . Appendectomy    . Colon resection  age 57    removal of 1 ft. large and small intestine  . Tonsillectomy N/A 10/08/2013    Procedure: TONSILLECTOMY;  Surgeon: Susan Nunnery, MD;  Location: [place];  Service: ENT;  Laterality: N/A;    Family History  Problem Relation Age of Onset  . Cancer Mother     breast  . Hyperlipidemia Mother   . Hypertension Mother   . Hypertension Father   . Hypertension Brother   . Hypertension Maternal Grandmother   . Hyperlipidemia Maternal Grandfather   . Hypertension Maternal Grandfather   . Diabetes Paternal Grandmother   . Cancer Paternal Grandmother     breast  . Diabetes Paternal Grandfather     History  Substance Use Topics  . Smoking status: Never Smoker   . Smokeless tobacco: Never Used  . Alcohol Use: No     Comment: occasionally    Allergies: No Known Allergies  Prescriptions prior to admission  Medication Sig Dispense Refill Last Dose  . ALPRAZolam (XANAX) 0.5 MG tablet Take 1 tablet  (0.5 mg total) by mouth as needed. 30 tablet 1 Not Taking  . cholecalciferol (VITAMIN D) 1000 UNITS tablet Take 1,000 Units by mouth daily.   Not Taking  . loratadine (CLARITIN) 10 MG tablet Take 10 mg by mouth daily.   Not Taking  . naproxen (NAPROSYN) 250 MG tablet Take by mouth 2 (two) times daily with a meal.   08/06/2014 at Unknown time  . ondansetron (ZOFRAN) 4 MG/5ML solution Take 5 mLs (4 mg total) by mouth every 8 (eight) hours as needed for nausea or vomiting. 50 mL 0 Not Taking  . pantoprazole (PROTONIX) 40 MG tablet Take 40 mg by mouth daily.   08/06/2014 at Unknown time  . Prenatal Vit-Fe Fumarate-FA (PRENATAL MULTIVITAMIN) TABS Take 1 tablet by mouth at bedtime.    08/06/2014 at Unknown time  . valACYclovir (VALTREX) 500 MG tablet Take 500 mg by mouth 2 (two) times daily.   08/06/2014 at Unknown time    Review of Systems  Constitutional: Negative.   HENT: Negative.   Eyes: Negative.   Respiratory: Negative.   Gastrointestinal: Negative.   Genitourinary:       UC's every 2-3 mins; decreased FM since earlier tonight; (+) FHT with home doppler  Musculoskeletal: Negative.   Skin: Negative.   Neurological: Negative.  Endo/Heme/Allergies: Negative.   Psychiatric/Behavioral: Negative.    CEFM FHR: 125 bpm / moderate variability / accels present / occ. variable decels TOCO: Irregular contractions every 1.5 - 6 mins  Physical Exam   Blood pressure 126/79, pulse 91, temperature 97.7 F (36.5 C), resp. rate 18, height 5' 2.5" (1.588 m), weight 70.489 kg (155 lb 6.4 oz), last menstrual period 09/20/2013.  Physical Exam  Constitutional: She is oriented to person, place, and time. She appears well-developed and well-nourished.  HENT:  Head: Normocephalic and atraumatic.  Eyes: Pupils are equal, round, and reactive to light.  Neck: Normal range of motion.  Cardiovascular: Normal rate, regular rhythm and normal heart sounds.   Respiratory: Effort normal and breath sounds normal.  GI:  Soft. Bowel sounds are normal.  Genitourinary:  Gravid; S=D, (+) FM palpated and visualized; VE (by Bethel Born, RN) 1/80%/-3/vtx  Musculoskeletal: Normal range of motion.  Neurological: She is alert and oriented to person, place, and time. She has normal reflexes.  Skin: Skin is warm and dry.  Psychiatric: She has a normal mood and affect. Her behavior is normal. Judgment and thought content normal.    MAU Course  Procedures CEFM  Assessment and Plan  G3P2002 at 38.[redacted] wks gestation Decreased Fetal Movement Irregular Uterine Contractions Category 1 FHR tracing  Labor Instructions given / contractions on the 5-1-1 tracking system - call the office for contractions every 5 mins lasting 1 minute x 1 hour, bleeding like a menstrual period, leaking or gushing of clear watery discharge from vagina, and decreased/no fetal movement Keep scheduled ROB next week Note give to be OOW today Call the office for any further questions, problems or concerns  Graceann Congress MSN, CNM 08/07/2014, 3:55 AM

## 2014-08-07 NOTE — Progress Notes (Signed)
Laury Deep CNM in to see pt

## 2014-08-07 NOTE — MAU Note (Signed)
Contractions since 2200. Have not felt FM since 2200. Denies LOF or bleeding. 1cm on Tues

## 2014-08-07 NOTE — Discharge Instructions (Signed)
Braxton Hicks Contractions Contractions of the uterus can occur throughout pregnancy. Contractions are not always a sign that you are in labor.  WHAT ARE BRAXTON HICKS CONTRACTIONS?  Contractions that occur before labor are called Braxton Hicks contractions, or false labor. Toward the end of pregnancy (32-34 weeks), these contractions can develop more often and may become more forceful. This is not true labor because these contractions do not result in opening (dilatation) and thinning of the cervix. They are sometimes difficult to tell apart from true labor because these contractions can be forceful and people have different pain tolerances. You should not feel embarrassed if you go to the hospital with false labor. Sometimes, the only way to tell if you are in true labor is for your health care provider to look for changes in the cervix. If there are no prenatal problems or other health problems associated with the pregnancy, it is completely safe to be sent home with false labor and await the onset of true labor. HOW CAN YOU TELL THE DIFFERENCE BETWEEN TRUE AND FALSE LABOR? False Labor  The contractions of false labor are usually shorter and not as hard as those of true labor.   The contractions are usually irregular.   The contractions are often felt in the front of the lower abdomen and in the groin.   The contractions may go away when you walk around or change positions while lying down.   The contractions get weaker and are shorter lasting as time goes on.   The contractions do not usually become progressively stronger, regular, and closer together as with true labor.  True Labor 1. Contractions in true labor last 30-70 seconds, become very regular, usually become more intense, and increase in frequency.  2. The contractions do not go away with walking.  3. The discomfort is usually felt in the top of the uterus and spreads to the lower abdomen and low back.  4. True labor can  be determined by your health care provider with an exam. This will show that the cervix is dilating and getting thinner.  WHAT TO REMEMBER  Keep up with your usual exercises and follow other instructions given by your health care provider.   Take medicines as directed by your health care provider.   Keep your regular prenatal appointments.   Eat and drink lightly if you think you are going into labor.   If Braxton Hicks contractions are making you uncomfortable:   Change your position from lying down or resting to walking, or from walking to resting.   Sit and rest in a tub of warm water.   Drink 2-3 glasses of water. Dehydration may cause these contractions.   Do slow and deep breathing several times an hour.  WHEN SHOULD I SEEK IMMEDIATE MEDICAL CARE? Seek immediate medical care if:  Your contractions become stronger, more regular, and closer together.   You have fluid leaking or gushing from your vagina.   You have a fever.   You pass blood-tinged mucus.   You have vaginal bleeding.   You have continuous abdominal pain.   You have low back pain that you never had before.   You feel your baby's head pushing down and causing pelvic pressure.   Your baby is not moving as much as it used to.  Document Released: 04/15/2005 Document Revised: 04/20/2013 Document Reviewed: 01/25/2013 [facility] Patient Information 2015 [place], [place]. This information is not intended to replace advice given to you by your health care  provider. Make sure you discuss any questions you have with your health care provider.  Fetal Movement Counts Patient Name: __________________________________________________ Patient Due Date: ____________________ Performing a fetal movement count is highly recommended in high-risk pregnancies, but it is good for every pregnant woman to do. Your health care provider may ask you to start counting fetal movements at 28 weeks of the pregnancy. Fetal  movements often increase:  After eating a full meal.  After physical activity.  After eating or drinking something sweet or cold.  At rest. Pay attention to when you feel the baby is most active. This will help you notice a pattern of your baby's sleep and wake cycles and what factors contribute to an increase in fetal movement. It is important to perform a fetal movement count at the same time each day when your baby is normally most active.  HOW TO COUNT FETAL MOVEMENTS 5. Find a quiet and comfortable area to sit or lie down on your left side. Lying on your left side provides the best blood and oxygen circulation to your baby. 6. Write down the day and time on a sheet of paper or in a journal. 7. Start counting kicks, flutters, swishes, rolls, or jabs in a 2-hour period. You should feel at least 10 movements within 2 hours. 8. If you do not feel 10 movements in 2 hours, wait 2-3 hours and count again. Look for a change in the pattern or not enough counts in 2 hours. SEEK MEDICAL CARE IF:  You feel less than 10 counts in 2 hours, tried twice.  There is no movement in over an hour.  The pattern is changing or taking longer each day to reach 10 counts in 2 hours.  You feel the baby is not moving as he or she usually does. Date: ____________ Movements: ____________ Start time: ____________ Elizebeth Koller time: ____________  Date: ____________ Movements: ____________ Start time: ____________ Elizebeth Koller time: ____________ Date: ____________ Movements: ____________ Start time: ____________ Elizebeth Koller time: ____________ Date: ____________ Movements: ____________ Start time: ____________ Elizebeth Koller time: ____________ Date: ____________ Movements: ____________ Start time: ____________ Elizebeth Koller time: ____________ Date: ____________ Movements: ____________ Start time: ____________ Elizebeth Koller time: ____________ Date: ____________ Movements: ____________ Start time: ____________ Elizebeth Koller time: ____________ Date: ____________  Movements: ____________ Start time: ____________ Elizebeth Koller time: ____________  Date: ____________ Movements: ____________ Start time: ____________ Elizebeth Koller time: ____________ Date: ____________ Movements: ____________ Start time: ____________ Elizebeth Koller time: ____________ Date: ____________ Movements: ____________ Start time: ____________ Elizebeth Koller time: ____________ Date: ____________ Movements: ____________ Start time: ____________ Elizebeth Koller time: ____________ Date: ____________ Movements: ____________ Start time: ____________ Elizebeth Koller time: ____________ Date: ____________ Movements: ____________ Start time: ____________ Elizebeth Koller time: ____________ Date: ____________ Movements: ____________ Start time: ____________ Elizebeth Koller time: ____________  Date: ____________ Movements: ____________ Start time: ____________ Elizebeth Koller time: ____________ Date: ____________ Movements: ____________ Start time: ____________ Elizebeth Koller time: ____________ Date: ____________ Movements: ____________ Start time: ____________ Elizebeth Koller time: ____________ Date: ____________ Movements: ____________ Start time: ____________ Elizebeth Koller time: ____________ Date: ____________ Movements: ____________ Start time: ____________ Elizebeth Koller time: ____________ Date: ____________ Movements: ____________ Start time: ____________ Elizebeth Koller time: ____________ Date: ____________ Movements: ____________ Start time: ____________ Elizebeth Koller time: ____________  Date: ____________ Movements: ____________ Start time: ____________ Elizebeth Koller time: ____________ Date: ____________ Movements: ____________ Start time: ____________ Elizebeth Koller time: ____________ Date: ____________ Movements: ____________ Start time: ____________ Elizebeth Koller time: ____________ Date: ____________ Movements: ____________ Start time: ____________ Elizebeth Koller time: ____________ Date: ____________ Movements: ____________ Start time: ____________ Elizebeth Koller time: ____________ Date: ____________ Movements: ____________ Start time:  ____________ Elizebeth Koller time: ____________ Date: ____________ Movements:  ____________ Start time: ____________ Elizebeth Koller time: ____________  Date: ____________ Movements: ____________ Start time: ____________ Elizebeth Koller time: ____________ Date: ____________ Movements: ____________ Start time: ____________ Elizebeth Koller time: ____________ Date: ____________ Movements: ____________ Start time: ____________ Elizebeth Koller time: ____________ Date: ____________ Movements: ____________ Start time: ____________ Elizebeth Koller time: ____________ Date: ____________ Movements: ____________ Start time: ____________ Elizebeth Koller time: ____________ Date: ____________ Movements: ____________ Start time: ____________ Elizebeth Koller time: ____________ Date: ____________ Movements: ____________ Start time: ____________ Elizebeth Koller time: ____________  Date: ____________ Movements: ____________ Start time: ____________ Elizebeth Koller time: ____________ Date: ____________ Movements: ____________ Start time: ____________ Elizebeth Koller time: ____________ Date: ____________ Movements: ____________ Start time: ____________ Elizebeth Koller time: ____________ Date: ____________ Movements: ____________ Start time: ____________ Elizebeth Koller time: ____________ Date: ____________ Movements: ____________ Start time: ____________ Elizebeth Koller time: ____________ Date: ____________ Movements: ____________ Start time: ____________ Elizebeth Koller time: ____________ Date: ____________ Movements: ____________ Start time: ____________ Elizebeth Koller time: ____________  Date: ____________ Movements: ____________ Start time: ____________ Elizebeth Koller time: ____________ Date: ____________ Movements: ____________ Start time: ____________ Elizebeth Koller time: ____________ Date: ____________ Movements: ____________ Start time: ____________ Elizebeth Koller time: ____________ Date: ____________ Movements: ____________ Start time: ____________ Elizebeth Koller time: ____________ Date: ____________ Movements: ____________ Start time: ____________ Elizebeth Koller time: ____________ Date:  ____________ Movements: ____________ Start time: ____________ Elizebeth Koller time: ____________ Date: ____________ Movements: ____________ Start time: ____________ Elizebeth Koller time: ____________  Date: ____________ Movements: ____________ Start time: ____________ Elizebeth Koller time: ____________ Date: ____________ Movements: ____________ Start time: ____________ Elizebeth Koller time: ____________ Date: ____________ Movements: ____________ Start time: ____________ Elizebeth Koller time: ____________ Date: ____________ Movements: ____________ Start time: ____________ Elizebeth Koller time: ____________ Date: ____________ Movements: ____________ Start time: ____________ Elizebeth Koller time: ____________ Date: ____________ Movements: ____________ Start time: ____________ Elizebeth Koller time: ____________ Document Released: 05/15/2006 Document Revised: 08/30/2013 Document Reviewed: 02/10/2012 [employer] Patient Information 2015 [place]. This information is not intended to replace advice given to you by your health care provider. Make sure you discuss any questions you have with your health care provider.

## 2014-08-07 NOTE — Progress Notes (Signed)
Written and verbal d/c instructions given and understanding voiced. Kick count discussed and understanding voiced. Also has written info for kick count and Braxton Hicks ctxs

## 2014-08-08 ENCOUNTER — Encounter (HOSPITAL_COMMUNITY): Payer: Self-pay | Admitting: *Deleted

## 2014-08-08 ENCOUNTER — Telehealth (HOSPITAL_COMMUNITY): Payer: Self-pay | Admitting: *Deleted

## 2014-08-08 NOTE — Telephone Encounter (Signed)
Preadmission screen  

## 2014-08-16 ENCOUNTER — Inpatient Hospital Stay (HOSPITAL_COMMUNITY): Payer: 59 | Admitting: Anesthesiology

## 2014-08-16 ENCOUNTER — Inpatient Hospital Stay (HOSPITAL_COMMUNITY)
Admission: RE | Admit: 2014-08-16 | Discharge: 2014-08-18 | DRG: 775 | Disposition: A | Discharge status: Home / Self Care | Payer: 59 | Source: Ambulatory Visit | Attending: Obstetrics and Gynecology | Admitting: Obstetrics and Gynecology

## 2014-08-16 ENCOUNTER — Encounter (HOSPITAL_COMMUNITY): Payer: Self-pay

## 2014-08-16 ENCOUNTER — Other Ambulatory Visit: Payer: Self-pay | Admitting: Obstetrics and Gynecology

## 2014-08-16 VITALS — BP 115/75 | HR 72 | Temp 97.7°F | Resp 18 | Ht 62.0 in | Wt 155.0 lb

## 2014-08-16 DIAGNOSIS — Z3A39 39 weeks gestation of pregnancy: Secondary | ICD-10-CM | POA: Diagnosis present

## 2014-08-16 DIAGNOSIS — O479 False labor, unspecified: Secondary | ICD-10-CM

## 2014-08-16 DIAGNOSIS — D561 Beta thalassemia: Secondary | ICD-10-CM | POA: Diagnosis present

## 2014-08-16 DIAGNOSIS — IMO0001 Reserved for inherently not codable concepts without codable children: Secondary | ICD-10-CM

## 2014-08-16 DIAGNOSIS — O09523 Supervision of elderly multigravida, third trimester: Secondary | ICD-10-CM

## 2014-08-16 DIAGNOSIS — O368190 Decreased fetal movements, unspecified trimester, not applicable or unspecified: Secondary | ICD-10-CM | POA: Diagnosis present

## 2014-08-16 DIAGNOSIS — Z3483 Encounter for supervision of other normal pregnancy, third trimester: Secondary | ICD-10-CM | POA: Diagnosis present

## 2014-08-16 LAB — CBC
HCT: 30.7 % — ABNORMAL LOW (ref 36.0–46.0)
Hemoglobin: 9.8 g/dL — ABNORMAL LOW (ref 12.0–15.0)
MCH: 21.1 pg — AB (ref 26.0–34.0)
MCHC: 31.9 g/dL (ref 30.0–36.0)
MCV: 66 fL — ABNORMAL LOW (ref 78.0–100.0)
Platelets: 157 10*3/uL (ref 150–400)
RBC: 4.65 MIL/uL (ref 3.87–5.11)
RDW: 16.3 % — ABNORMAL HIGH (ref 11.5–15.5)
WBC: 11.1 10*3/uL — ABNORMAL HIGH (ref 4.0–10.5)

## 2014-08-16 LAB — TYPE AND SCREEN
ABO/RH(D): O POS
Antibody Screen: NEGATIVE

## 2014-08-16 MED ORDER — OXYTOCIN BOLUS FROM INFUSION
500.0000 mL | INTRAVENOUS | Status: DC
  Administered 2014-08-16: 500 mL via INTRAVENOUS

## 2014-08-16 MED ORDER — DIPHENHYDRAMINE HCL 50 MG/ML IJ SOLN: 12.5000 mg | INTRAMUSCULAR | Status: DC | PRN

## 2014-08-16 MED ORDER — ONDANSETRON HCL 4 MG/2ML IJ SOLN: 4.0000 mg | INTRAMUSCULAR | Status: DC | PRN

## 2014-08-16 MED ORDER — ZOLPIDEM TARTRATE 5 MG PO TABS: 5.0000 mg | ORAL_TABLET | Freq: Every evening | ORAL | Status: DC | PRN

## 2014-08-16 MED ORDER — OXYCODONE-ACETAMINOPHEN 5-325 MG PO TABS: 1.0000 | ORAL_TABLET | ORAL | Status: DC | PRN

## 2014-08-16 MED ORDER — LIDOCAINE HCL (PF) 1 % IJ SOLN
INTRAMUSCULAR | Status: DC | PRN
  Administered 2014-08-16 (×2): 8 mL

## 2014-08-16 MED ORDER — SIMETHICONE 80 MG PO CHEW: 80.0000 mg | CHEWABLE_TABLET | ORAL | Status: DC | PRN

## 2014-08-16 MED ORDER — TERBUTALINE SULFATE 1 MG/ML IJ SOLN
0.2500 mg | Freq: Once | INTRAMUSCULAR | Status: AC | PRN
  Filled 2014-08-16: qty 1

## 2014-08-16 MED ORDER — DIPHENHYDRAMINE HCL 25 MG PO CAPS: 25.0000 mg | ORAL_CAPSULE | Freq: Four times a day (QID) | ORAL | Status: DC | PRN

## 2014-08-16 MED ORDER — FENTANYL 2.5 MCG/ML BUPIVACAINE 1/10 % EPIDURAL INFUSION (WH - ANES)
14.0000 mL/h | INTRAMUSCULAR | Status: DC | PRN
  Administered 2014-08-16: 14 mL/h via EPIDURAL
  Filled 2014-08-16: qty 125

## 2014-08-16 MED ORDER — OXYCODONE-ACETAMINOPHEN 5-325 MG PO TABS: 2.0000 | ORAL_TABLET | ORAL | Status: DC | PRN

## 2014-08-16 MED ORDER — OXYTOCIN 40 UNITS IN LACTATED RINGERS INFUSION - SIMPLE MED: 1.0000 m[IU]/min | INTRAVENOUS | Status: DC

## 2014-08-16 MED ORDER — DIBUCAINE 1 % RE OINT: 1.0000 "application " | TOPICAL_OINTMENT | RECTAL | Status: DC | PRN

## 2014-08-16 MED ORDER — OXYCODONE-ACETAMINOPHEN 5-325 MG PO TABS
1.0000 | ORAL_TABLET | ORAL | Status: DC | PRN
  Administered 2014-08-17 – 2014-08-18 (×2): 1 via ORAL
  Filled 2014-08-16 (×2): qty 1

## 2014-08-16 MED ORDER — PHENYLEPHRINE 40 MCG/ML (10ML) SYRINGE FOR IV PUSH (FOR BLOOD PRESSURE SUPPORT)
80.0000 ug | PREFILLED_SYRINGE | INTRAVENOUS | Status: DC | PRN
  Filled 2014-08-16: qty 2

## 2014-08-16 MED ORDER — FERROUS SULFATE 325 (65 FE) MG PO TABS
325.0000 mg | ORAL_TABLET | Freq: Two times a day (BID) | ORAL | Status: DC
  Administered 2014-08-17 (×2): 325 mg via ORAL
  Filled 2014-08-16 (×2): qty 1

## 2014-08-16 MED ORDER — ACETAMINOPHEN 325 MG PO TABS: 650.0000 mg | ORAL_TABLET | ORAL | Status: DC | PRN

## 2014-08-16 MED ORDER — LACTATED RINGERS IV SOLN
500.0000 mL | Freq: Once | INTRAVENOUS | Status: AC
  Administered 2014-08-16: 500 mL via INTRAVENOUS

## 2014-08-16 MED ORDER — PRENATAL MULTIVITAMIN CH
1.0000 | ORAL_TABLET | Freq: Every day | ORAL | Status: DC
  Administered 2014-08-17 – 2014-08-18 (×2): 1 via ORAL
  Filled 2014-08-16 (×2): qty 1

## 2014-08-16 MED ORDER — OXYTOCIN 40 UNITS IN LACTATED RINGERS INFUSION - SIMPLE MED: 62.5000 mL/h | INTRAVENOUS | Status: DC

## 2014-08-16 MED ORDER — PHENYLEPHRINE 40 MCG/ML (10ML) SYRINGE FOR IV PUSH (FOR BLOOD PRESSURE SUPPORT)
80.0000 ug | PREFILLED_SYRINGE | INTRAVENOUS | Status: DC | PRN
  Filled 2014-08-16: qty 20
  Filled 2014-08-16: qty 2

## 2014-08-16 MED ORDER — EPHEDRINE 5 MG/ML INJ
10.0000 mg | INTRAVENOUS | Status: DC | PRN
  Filled 2014-08-16: qty 2

## 2014-08-16 MED ORDER — BENZOCAINE-MENTHOL 20-0.5 % EX AERO: 1.0000 "application " | INHALATION_SPRAY | CUTANEOUS | Status: DC | PRN

## 2014-08-16 MED ORDER — CITRIC ACID-SODIUM CITRATE 334-500 MG/5ML PO SOLN: 30.0000 mL | ORAL | Status: DC | PRN

## 2014-08-16 MED ORDER — WITCH HAZEL-GLYCERIN EX PADS: 1.0000 "application " | MEDICATED_PAD | CUTANEOUS | Status: DC | PRN

## 2014-08-16 MED ORDER — LIDOCAINE HCL (PF) 1 % IJ SOLN
30.0000 mL | INTRAMUSCULAR | Status: DC | PRN
  Filled 2014-08-16: qty 30

## 2014-08-16 MED ORDER — SENNOSIDES-DOCUSATE SODIUM 8.6-50 MG PO TABS
2.0000 | ORAL_TABLET | ORAL | Status: DC
  Filled 2014-08-16 (×2): qty 2

## 2014-08-16 MED ORDER — LANOLIN HYDROUS EX OINT: TOPICAL_OINTMENT | CUTANEOUS | Status: DC | PRN

## 2014-08-16 MED ORDER — TERBUTALINE SULFATE 1 MG/ML IJ SOLN
0.2500 mg | Freq: Once | INTRAMUSCULAR | Status: DC | PRN
  Filled 2014-08-16: qty 1

## 2014-08-16 MED ORDER — LACTATED RINGERS IV SOLN
INTRAVENOUS | Status: DC
  Administered 2014-08-16: 16:00:00 via INTRAVENOUS

## 2014-08-16 MED ORDER — ONDANSETRON HCL 4 MG PO TABS: 4.0000 mg | ORAL_TABLET | ORAL | Status: DC | PRN

## 2014-08-16 MED ORDER — OXYTOCIN 40 UNITS IN LACTATED RINGERS INFUSION - SIMPLE MED
1.0000 m[IU]/min | INTRAVENOUS | Status: DC
  Administered 2014-08-16: 2 m[IU]/min via INTRAVENOUS
  Filled 2014-08-16: qty 1000

## 2014-08-16 MED ORDER — LACTATED RINGERS IV SOLN: 500.0000 mL | INTRAVENOUS | Status: DC | PRN

## 2014-08-16 MED ORDER — ONDANSETRON HCL 4 MG/2ML IJ SOLN: 4.0000 mg | Freq: Four times a day (QID) | INTRAMUSCULAR | Status: DC | PRN

## 2014-08-16 MED ORDER — IBUPROFEN 600 MG PO TABS
600.0000 mg | ORAL_TABLET | Freq: Four times a day (QID) | ORAL | Status: DC
  Administered 2014-08-17 – 2014-08-18 (×7): 600 mg via ORAL
  Filled 2014-08-16 (×8): qty 1

## 2014-08-16 NOTE — H&P (Signed)
Susan Montoya is a 39 y.o. female presenting for IOL @ [redacted] weeks gestation 2nd to hx fast labor. Hx HSV on Valtrex  Maternal Medical History:  Fetal activity: Perceived fetal activity is normal.    Prenatal complications: no prenatal complications   OB History    Gravida Para Term Preterm AB TAB SAB Ectopic Multiple Living   3 2 2  0 0 0 0 0 0 2     Past Medical History  Diagnosis Date  . Crohn's disease     no current med.  . Beta thalassemia minor   . GERD (gastroesophageal reflux disease)     occasional  . Chronic tonsillitis 09/2013  . TMJ syndrome     right side  . Decreased fetal movement during pregnancy in third trimester, antepartum 08/07/2014  . Irregular uterine contractions 08/07/2014  . HSV (herpes simplex virus) anogenital infection    Past Surgical History  Procedure Laterality Date  . Appendectomy    . Colon resection  age 30    removal of 1 ft. large and small intestine  . Tonsillectomy N/A 10/08/2013    Procedure: TONSILLECTOMY;  Surgeon: Rozetta Nunnery, MD;  Location: [place];  Service: ENT;  Laterality: N/A;   Family History: family history includes Anemia in her maternal grandmother; Cancer in her maternal grandfather, mother, and paternal grandmother; Diabetes in her paternal grandfather and paternal grandmother; Heart disease in her father and maternal grandfather; Hyperlipidemia in her maternal grandfather and mother; Hypertension in her brother, father, maternal grandfather, maternal grandmother, and mother; Stroke in her maternal grandfather; Yves Dill Parkinson White syndrome in her father. Social History:  reports that she has never smoked. She has never used smokeless tobacco. She reports that she does not drink alcohol or use illicit drugs.   Prenatal Transfer Tool  Maternal Diabetes: No Genetic Screening: Normal Maternal Ultrasounds/Referrals: Abnormal:  Findings:   Isolated EIF (echogenic intracardiac focus) Fetal Ultrasounds or  other Referrals:  None Maternal Substance Abuse:  No Significant Maternal Medications:  Meds include: Other: valtrex Significant Maternal Lab Results:  Lab values include: Group B Strep negative Other Comments:  b thal minor trait, hx HSV  Review of Systems  All other systems reviewed and are negative.   Dilation: 2 Effacement (%): 70 Station: -2 Exam by:: Dr. Garwin Brothers  Blood pressure 111/72, pulse 80, temperature 98.4 F (36.9 C), temperature source Oral, resp. rate 18, last menstrual period 09/20/2013. Maternal Exam:  Abdomen: Patient reports no abdominal tenderness. Estimated fetal weight is 7 1/2 lb.   Fetal presentation: vertex  Introitus: Normal vulva. Normal vagina.    Physical Exam  Constitutional: She is oriented to person, place, and time. She appears well-developed and well-nourished.  HENT:  Head: Atraumatic.  Eyes: EOM are normal.  Neck: Neck supple.  Cardiovascular: Normal rate and regular rhythm.   Respiratory: Breath sounds normal.  GI: Soft.  Musculoskeletal: She exhibits no edema.  Neurological: She is alert and oriented to person, place, and time. She has normal reflexes.  Skin: Skin is warm and dry.  Psychiatric: She has a normal mood and affect.    Prenatal labs: ABO, Rh: --/--/O POS (04/19 1505) Antibody: negative Rubella: Immune (09/11 0000) RPR: Nonreactive (09/11 0000)  HBsAg: Negative (09/11 0000)  HIV: Non-reactive (09/11 0000)  GBS: Negative (03/31 0000)   Assessment/Plan: Hx fast labor Term gestation P) admit routine labs. Epidural. Pitocin. Amniotomy prn   Kairie Vangieson A 08/16/2014, 4:35 PM

## 2014-08-16 NOTE — Anesthesia Preprocedure Evaluation (Signed)
Anesthesia Evaluation  Patient identified by MRN, date of birth, ID band Patient awake    Reviewed: Allergy & Precautions, H&P , NPO status , Patient's Chart, lab work & pertinent test results  Airway Mallampati: I  TM Distance: >3 FB Neck ROM: full    Dental no notable dental hx.    Pulmonary neg pulmonary ROS,    Pulmonary exam normal       Cardiovascular negative cardio ROS      Neuro/Psych negative neurological ROS  negative psych ROS   GI/Hepatic Neg liver ROS,   Endo/Other  negative endocrine ROS  Renal/GU negative Renal ROS     Musculoskeletal   Abdominal Normal abdominal exam  (+)   Peds  Hematology   Anesthesia Other Findings   Reproductive/Obstetrics (+) Pregnancy                             Anesthesia Physical Anesthesia Plan  ASA: II  Anesthesia Plan: Epidural   Post-op Pain Management:    Induction:   Airway Management Planned:   Additional Equipment:   Intra-op Plan:   Post-operative Plan:   Informed Consent: I have reviewed the patients History and Physical, chart, labs and discussed the procedure including the risks, benefits and alternatives for the proposed anesthesia with the patient or authorized representative who has indicated his/her understanding and acceptance.     Plan Discussed with:   Anesthesia Plan Comments:         Anesthesia Quick Evaluation

## 2014-08-16 NOTE — Anesthesia Procedure Notes (Signed)
Epidural Patient location during procedure: OB Start time: 08/16/2014 3:17 PM End time: 08/16/2014 4:21 PM  Staffing Anesthesiologist: Lyn Hollingshead Performed by: anesthesiologist   Preanesthetic Checklist Completed: patient identified, surgical consent, pre-op evaluation, timeout performed, IV checked, risks and benefits discussed and monitors and equipment checked  Epidural Patient position: sitting Prep: site prepped and draped and DuraPrep Patient monitoring: continuous pulse ox and blood pressure Approach: midline Location: L3-L4 Injection technique: LOR air  Needle:  Needle type: Tuohy  Needle gauge: 17 G Needle length: 9 cm and 9 Needle insertion depth: 5 cm cm Catheter type: closed end flexible Catheter size: 19 Gauge Catheter at skin depth: 10 cm Test dose: negative and Other  Assessment Sensory level: T9 Events: blood not aspirated, injection not painful, no injection resistance, negative IV test and no paresthesia  Additional Notes Reason for block:procedure for pain

## 2014-08-16 NOTE — Progress Notes (Signed)
S; notes some intermittent pressure SROM clear fluid  O;  Epidural Pitocin VE 6 cm per RN Tracing baseline 145 (+) accel Ctx q 2 mins  IMP: Active phase Hx fast labor P) cont present mgmt

## 2014-08-16 NOTE — Progress Notes (Signed)
S:  No complaint  O: Blood pressure 127/68, pulse 75, temperature 98.4 F (36.9 C), temperature source Oral, resp. rate 20, height 5' 2"  (1.575 m), weight 70.308 kg (155 lb), last menstrual period 09/20/2013. Pitocin 6 MIU VE: deferred  Tracing: baseline 120 (+) accel 140 Ctx q 2-3 mins some  Triplet ctx  IMP: Term gestation suspect OP presentation  P) right exaggerated sims. Increase pitocin. Defer amniotomy

## 2014-08-17 LAB — CBC
HEMATOCRIT: 28.6 % — AB (ref 36.0–46.0)
Hemoglobin: 9 g/dL — ABNORMAL LOW (ref 12.0–15.0)
MCH: 21 pg — AB (ref 26.0–34.0)
MCHC: 31.5 g/dL (ref 30.0–36.0)
MCV: 66.8 fL — AB (ref 78.0–100.0)
Platelets: 148 10*3/uL — ABNORMAL LOW (ref 150–400)
RBC: 4.28 MIL/uL (ref 3.87–5.11)
RDW: 16.3 % — ABNORMAL HIGH (ref 11.5–15.5)
WBC: 12.5 10*3/uL — ABNORMAL HIGH (ref 4.0–10.5)

## 2014-08-17 LAB — CCBB MATERNAL DONOR DRAW

## 2014-08-17 LAB — RPR: RPR Ser Ql: NONREACTIVE

## 2014-08-17 MED ORDER — LORATADINE 10 MG PO TABS
10.0000 mg | ORAL_TABLET | Freq: Every day | ORAL | Status: DC
  Administered 2014-08-17 – 2014-08-18 (×2): 10 mg via ORAL
  Filled 2014-08-17 (×2): qty 1

## 2014-08-17 MED ORDER — SALINE SPRAY 0.65 % NA SOLN
1.0000 | NASAL | Status: DC | PRN
  Filled 2014-08-17: qty 44

## 2014-08-17 NOTE — Lactation Note (Signed)
This note was copied from the chart of Girl Reyah Streeter. Lactation Consultation Note  Patient Name: Girl Ajanay Farve ETKKO'E Date: 08/17/2014 Reason for consult: Initial assessment Initial visit. Mother has experience with breastfeeding. Baby is feeding well. Taught hand expression and alternate breast massage during feedings. Mother has lots of colostrum and swallows noted during the feeding. Mom made aware of O/P services, breastfeeding support groups, community resources, and our phone # for post-discharge questions. Lactation will follow if needed, breastfeeding is progressing well.  Maternal Data Has patient been taught Hand Expression?: Yes (LC taught and mother returned demonstration) Does the patient have breastfeeding experience prior to this delivery?: Yes  Feeding Feeding Type: Breast Fed Length of feed: 20 min  LATCH Score/Interventions Latch: Grasps breast easily, tongue down, lips flanged, rhythmical sucking.  Audible Swallowing: Spontaneous and intermittent  Type of Nipple: Everted at rest and after stimulation  Comfort (Breast/Nipple): Soft / non-tender     Hold (Positioning): No assistance needed to correctly position infant at breast.  LATCH Score: 10  Lactation Tools Discussed/Used     Consult Status Consult Status: PRN    Gwenevere Abbot 08/17/2014, 11:39 AM

## 2014-08-17 NOTE — Progress Notes (Signed)
PPD Day 1 S/P SVD  S:  Reports feeling good             Tolerating po/ No nausea or vomiting             Bleeding is light, no odor             Pain controlled withibuprofen (OTC) and narcotic analgesics including Percocet             Up ad lib / ambulatory / voiding QS  Newborn breast feeding    Successfully breastfeeding  Anesthesia in labor Epidural   O:               VS: BP 117/82 mmHg  Pulse 80  Temp(Src) 97.9 F (36.6 C) (Axillary)  Resp 16  Ht 5' 2"  (1.575 m)  Wt 70.308 kg (155 lb)  BMI 28.34 kg/m2  SpO2 100%  LMP 09/20/2013  Breastfeeding? Unknown   LABS:              Recent Labs  08/16/14 1505 08/17/14 0550  WBC 11.1* 12.5*  HGB 9.8* 9.0*  PLT 157 148*               Blood type: --/--/O POS (04/19 1505)  Rubella: Immune (09/11 0000)                     EBL: 50   I&O: Intake/Output      04/19 0701 - 04/20 0700 04/20 0701 - 04/21 0700   Urine (mL/kg/hr) 2150    Blood 125    Total Output 2275     Net -2275          Urine Occurrence 2 x                  Physical Exam:             Alert and oriented X3  Abdomen: soft, non-tender, non-distended              Fundus: firm, non-tender, U-1  Perineum: intact  Hemorroids:  None  Lochia: mild, no odor  Extremities: no edema, no calf pain or tenderness    Induction/Augmentation with Pitocin for Hx of fast labor  TDaP UTD   A: PPD # 1  Doing well - stable status  Crohns Disease well-controlled with diet, no current meds  P: Routine post partum orders  PP OV on April 27  Special instructions/meds None  Anticipate discharge tomorrow   Maye Hides, SNM 08/17/2014, 10:23 AM

## 2014-08-17 NOTE — Anesthesia Postprocedure Evaluation (Signed)
  Anesthesia Post-op Note  Patient: Susan Montoya  Procedure(s) Performed: * No procedures listed *  Patient Location: Mother/Baby  Anesthesia Type:Epidural  Level of Consciousness: awake, alert , oriented and patient cooperative  Airway and Oxygen Therapy: Patient Spontanous Breathing  Post-op Pain: none  Post-op Assessment: Post-op Vital signs reviewed, Patient's Cardiovascular Status Stable, Respiratory Function Stable, Patent Airway, No headache, No backache, No residual numbness and No residual motor weakness  Post-op Vital Signs: Reviewed and stable  Last Vitals:  Filed Vitals:   08/17/14 0531  BP: 117/82  Pulse: 80  Temp: 36.6 C  Resp: 16    Complications: No apparent anesthesia complications

## 2014-08-18 DIAGNOSIS — D561 Beta thalassemia: Secondary | ICD-10-CM | POA: Diagnosis present

## 2014-08-18 MED ORDER — OXYCODONE-ACETAMINOPHEN 5-325 MG PO TABS: 1.0000 | ORAL_TABLET | ORAL | Status: DC | PRN

## 2014-08-18 MED ORDER — IBUPROFEN 600 MG PO TABS: 600.0000 mg | ORAL_TABLET | Freq: Four times a day (QID) | ORAL | Status: DC

## 2014-08-18 MED ORDER — MAGNESIUM 200 MG PO TABS
200.0000 mg | ORAL_TABLET | Freq: Every day | ORAL | Status: DC
  Filled 2014-08-18: qty 1

## 2014-08-18 NOTE — Discharge Summary (Signed)
Obstetric Discharge Summary Reason for Admission: induction of labor, hx fast labor Prenatal Procedures: ultrasound and hx of HSV, beta thalassemia minor Intrapartum Procedures: spontaneous vaginal delivery Postpartum Procedures: none Complications-Operative and Postpartum: none HEMOGLOBIN  Date Value Ref Range Status  08/17/2014 9.0* 12.0 - 15.0 g/dL Final   HCT  Date Value Ref Range Status  08/17/2014 28.6* 36.0 - 46.0 % Final    Physical Exam:  General: alert and cooperative Lochia: appropriate Uterine Fundus: firm Incision: healing well, no significant drainage, no dehiscence, no significant erythema DVT Evaluation: No evidence of DVT seen on physical exam. Negative Homan's sign. No cords or calf tenderness. No significant calf/ankle edema.  Discharge Diagnoses: Term Pregnancy-delivered  Discharge Information: Date: 08/18/2014 Activity: pelvic rest Diet: routine Medications: PNV, Ibuprofen and Percocet Condition: stable Instructions: refer to practice specific booklet Discharge to: home Follow-up Information    Follow up with Delan Ksiazek A, MD. Schedule an appointment as soon as possible for a visit in 6 weeks.   Specialty:  Obstetrics and Gynecology   Contact information:   [address] [place] [address] [phone]       Newborn Data: Live born female on 08/16/14 Birth Weight: 7 lb 3.9 oz (3285 g) APGAR: 8, 9  Home with mother.  BHAMBRI, [place], N 08/18/2014, 10:58 AM

## 2014-08-18 NOTE — Progress Notes (Signed)
PPD #2- SVD  Subjective:   Reports feeling well, ready for discharge Tolerating po/ No nausea or vomiting Bleeding is light Pain controlled with Motrin and Percocet Up ad lib / ambulatory / voiding without problems Newborn: breastfeeding     Objective:   VS: VS:  Filed Vitals:   08/17/14 0052 08/17/14 0531 08/17/14 1700 08/18/14 0646  BP: 118/68 117/82 131/88 115/75  Pulse: 67 80 69 72  Temp: 98.3 F (36.8 C) 97.9 F (36.6 C) 98.6 F (37 C) 97.7 F (36.5 C)  TempSrc: Axillary Axillary Axillary Oral  Resp: 16 16 18 18   Height:      Weight:      SpO2: 100% 100%      LABS:  Recent Labs  08/16/14 1505 08/17/14 0550  WBC 11.1* 12.5*  HGB 9.8* 9.0*  PLT 157 148*   Blood type: --/--/O POS (04/19 1505) Rubella: Immune (09/11 0000)                I&O: Intake/Output      04/20 0701 - 04/21 0700 04/21 0701 - 04/22 0700   Urine (mL/kg/hr)     Blood     Total Output       Net              Physical Exam: Alert and oriented X3 Abdomen: soft, non-tender, non-distended  Fundus: firm, non-tender, U-2 Perineum: no significant erythema, edema, or drainage Lochia: small Extremities: no edema, no calf pain or tenderness    Assessment: PPD #2  G3P3003/ S/P:spontaneous vaginal Beta thalassemia minor Doing well - stable for discharge home   Plan: Discharge home RX's:  Ibuprofen 622m po Q 6 hrs prn pain #30 Refill x 0 Percocet 5/325 1 to 2 po Q 4 hrs prn pain #30 Refill x 0 Routine pp visit in [facility] given    BJulianne Handler N MSN, CNM 08/18/2014, 9:30 AM

## 2014-08-18 NOTE — Lactation Note (Signed)
This note was copied from the chart of Susan Avyn Aden. Lactation Consultation Note  Patient Name: Susan Montoya [id number] Date: 08/18/2014 Reason for consult: Follow-up assessment (see LC note )  Baby is 58 hours old at 4% weight loss, breast feeding consistently 10 -60 mins  and mom  had pumped off 30 ml of EBM and fed it back to baby at 0430 . Per mom baby has been cluster fed all night,  When asked for the times mom declined to give the exact times because she was tired. Bili check - 24 - 4.7. LS 9-10. 4 wet , 2 stool, per mom nipples are tender, and scabbed , and already has comfort gels. LC reviewed sore nipple and engorgement prevention and tx . Mom already has a Hand pump and a DEBP at home.  Per mom will be going to see Irine Seal, IBCLC in [facility] for F/U/ Mother informed of post-discharge support and given phone number to the lactation department, including services for phone  call assistance; out-patient appointments; and breastfeeding support group. List of other breastfeeding resources in the community  given in the handout. Encouraged mother to call for problems or concerns related to breastfeeding.   Maternal Data    Feeding Feeding Type:  (per mom mentioned baby has been clustering all night and decliined giving the updates .of time )  [facility] Score/Interventions                      Lactation Tools Discussed/Used WIC Program: No   Consult Status Consult Status: Complete    Myer Haff 08/18/2014, 11:16 AM

## 2014-10-13 ENCOUNTER — Other Ambulatory Visit: Payer: Self-pay | Admitting: Obstetrics and Gynecology

## 2014-11-01 ENCOUNTER — Other Ambulatory Visit (HOSPITAL_COMMUNITY): Payer: 59

## 2014-11-01 ENCOUNTER — Encounter (HOSPITAL_COMMUNITY): Payer: Self-pay

## 2014-11-01 ENCOUNTER — Encounter (HOSPITAL_COMMUNITY)
Admission: RE | Admit: 2014-11-01 | Discharge: 2014-11-01 | Disposition: A | Discharge status: Home / Self Care | Payer: 59 | Source: Ambulatory Visit | Attending: Obstetrics and Gynecology | Admitting: Obstetrics and Gynecology

## 2014-11-01 DIAGNOSIS — Z01818 Encounter for other preprocedural examination: Secondary | ICD-10-CM | POA: Insufficient documentation

## 2014-11-01 HISTORY — DX: Functional dyspepsia: K30

## 2014-11-01 HISTORY — DX: Anxiety disorder, unspecified: F41.9

## 2014-11-01 LAB — CBC
HEMATOCRIT: 32 % — AB (ref 36.0–46.0)
HEMOGLOBIN: 9.9 g/dL — AB (ref 12.0–15.0)
MCH: 20 pg — ABNORMAL LOW (ref 26.0–34.0)
MCHC: 30.9 g/dL (ref 30.0–36.0)
MCV: 64.5 fL — ABNORMAL LOW (ref 78.0–100.0)
Platelets: 299 10*3/uL (ref 150–400)
RBC: 4.96 MIL/uL (ref 3.87–5.11)
RDW: 15.2 % (ref 11.5–15.5)
WBC: 8.4 10*3/uL (ref 4.0–10.5)

## 2014-11-01 NOTE — Patient Instructions (Addendum)
   Your procedure is scheduled on: Monday, July 11  Enter through the [employer] [facility] at:  [place] up the phone at the desk and dial [phone] and inform us of your arrival.  Please call this number if you have any problems the morning of surgery: [phone]  Remember: Do not eat food after midnight:  Sunday however, you may have dry toast only prior to 7 AM Monday, day of surgery. Do not drink clear liquids after:  11:30 AM Monday, day of surgery Take these medicines the morning of surgery with a SIP OF WATER: protonix  Do not wear jewelry, make-up, or FINGER nail polish No metal in your hair or on your body. Do not wear lotions, powders, perfumes.  You may wear deodorant.  Do not bring valuables to the hospital.  Patients discharged on the day of surgery will not be allowed to drive home.  Home with  [facility] cell [phone]

## 2014-11-07 ENCOUNTER — Ambulatory Visit (HOSPITAL_COMMUNITY): Payer: 59 | Admitting: Anesthesiology

## 2014-11-07 ENCOUNTER — Ambulatory Visit (HOSPITAL_COMMUNITY)
Admission: RE | Admit: 2014-11-07 | Discharge: 2014-11-07 | Disposition: A | Discharge status: Home / Self Care | Payer: 59 | Source: Ambulatory Visit | Attending: Obstetrics and Gynecology | Admitting: Obstetrics and Gynecology

## 2014-11-07 ENCOUNTER — Encounter (HOSPITAL_COMMUNITY)
Admission: RE | Disposition: A | Discharge status: Home / Self Care | Payer: Self-pay | Source: Ambulatory Visit | Attending: Obstetrics and Gynecology

## 2014-11-07 ENCOUNTER — Encounter (HOSPITAL_COMMUNITY): Payer: Self-pay | Admitting: Anesthesiology

## 2014-11-07 DIAGNOSIS — K5090 Crohn's disease, unspecified, without complications: Secondary | ICD-10-CM | POA: Diagnosis not present

## 2014-11-07 DIAGNOSIS — M2660 Temporomandibular joint disorder, unspecified: Secondary | ICD-10-CM | POA: Diagnosis not present

## 2014-11-07 DIAGNOSIS — N803 Endometriosis of pelvic peritoneum, unspecified: Secondary | ICD-10-CM

## 2014-11-07 DIAGNOSIS — F419 Anxiety disorder, unspecified: Secondary | ICD-10-CM | POA: Insufficient documentation

## 2014-11-07 DIAGNOSIS — K219 Gastro-esophageal reflux disease without esophagitis: Secondary | ICD-10-CM | POA: Diagnosis not present

## 2014-11-07 DIAGNOSIS — D649 Anemia, unspecified: Secondary | ICD-10-CM | POA: Diagnosis not present

## 2014-11-07 DIAGNOSIS — Z302 Encounter for sterilization: Secondary | ICD-10-CM | POA: Diagnosis not present

## 2014-11-07 HISTORY — PX: LAPAROSCOPIC TUBAL LIGATION: SHX1937

## 2014-11-07 LAB — PREGNANCY, URINE: Preg Test, Ur: NEGATIVE

## 2014-11-07 SURGERY — LIGATION, FALLOPIAN TUBE, LAPAROSCOPIC
Anesthesia: General | Laterality: Bilateral

## 2014-11-07 MED ORDER — ONDANSETRON HCL 4 MG/2ML IJ SOLN
INTRAMUSCULAR | Status: DC | PRN
Start: 2014-11-07 — End: 2014-11-07
  Administered 2014-11-07: 4 mg via INTRAVENOUS

## 2014-11-07 MED ORDER — SCOPOLAMINE 1 MG/3DAYS TD PT72
MEDICATED_PATCH | TRANSDERMAL | Status: AC
  Administered 2014-11-07: 1.5 mg via TRANSDERMAL
  Filled 2014-11-07: qty 1

## 2014-11-07 MED ORDER — FENTANYL CITRATE (PF) 100 MCG/2ML IJ SOLN
INTRAMUSCULAR | Status: AC
  Filled 2014-11-07: qty 2

## 2014-11-07 MED ORDER — BUPIVACAINE HCL (PF) 0.25 % IJ SOLN
INTRAMUSCULAR | Status: AC
  Filled 2014-11-07: qty 30

## 2014-11-07 MED ORDER — MIDAZOLAM HCL 2 MG/2ML IJ SOLN
INTRAMUSCULAR | Status: AC
  Filled 2014-11-07: qty 2

## 2014-11-07 MED ORDER — PROPOFOL 10 MG/ML IV BOLUS
INTRAVENOUS | Status: DC | PRN
  Administered 2014-11-07: 150 mg via INTRAVENOUS

## 2014-11-07 MED ORDER — DEXAMETHASONE SODIUM PHOSPHATE 10 MG/ML IJ SOLN
INTRAMUSCULAR | Status: DC | PRN
  Administered 2014-11-07: 10 mg via INTRAVENOUS

## 2014-11-07 MED ORDER — BUPIVACAINE HCL (PF) 0.25 % IJ SOLN
INTRAMUSCULAR | Status: DC | PRN
  Administered 2014-11-07: 8 mL

## 2014-11-07 MED ORDER — ONDANSETRON HCL 4 MG/2ML IJ SOLN: 4.0000 mg | Freq: Once | INTRAMUSCULAR | Status: DC | PRN

## 2014-11-07 MED ORDER — ONDANSETRON HCL 4 MG/2ML IJ SOLN
INTRAMUSCULAR | Status: AC
  Filled 2014-11-07: qty 2

## 2014-11-07 MED ORDER — LACTATED RINGERS IV SOLN
INTRAVENOUS | Status: DC
  Administered 2014-11-07 (×3): via INTRAVENOUS

## 2014-11-07 MED ORDER — GLYCOPYRROLATE 0.2 MG/ML IJ SOLN
INTRAMUSCULAR | Status: AC
  Filled 2014-11-07: qty 3

## 2014-11-07 MED ORDER — SCOPOLAMINE 1 MG/3DAYS TD PT72
1.0000 | MEDICATED_PATCH | Freq: Once | TRANSDERMAL | Status: DC
  Administered 2014-11-07: 1.5 mg via TRANSDERMAL

## 2014-11-07 MED ORDER — KETOROLAC TROMETHAMINE 30 MG/ML IJ SOLN
INTRAMUSCULAR | Status: AC
  Filled 2014-11-07: qty 1

## 2014-11-07 MED ORDER — KETOROLAC TROMETHAMINE 30 MG/ML IJ SOLN
INTRAMUSCULAR | Status: DC | PRN
  Administered 2014-11-07: 30 mg via INTRAMUSCULAR
  Administered 2014-11-07: 30 mg via INTRAVENOUS

## 2014-11-07 MED ORDER — PROPOFOL 10 MG/ML IV BOLUS
INTRAVENOUS | Status: AC
  Filled 2014-11-07: qty 20

## 2014-11-07 MED ORDER — LIDOCAINE HCL (CARDIAC) 20 MG/ML IV SOLN
INTRAVENOUS | Status: DC | PRN
  Administered 2014-11-07: 50 mg via INTRAVENOUS

## 2014-11-07 MED ORDER — NEOSTIGMINE METHYLSULFATE 10 MG/10ML IV SOLN
INTRAVENOUS | Status: AC
  Filled 2014-11-07: qty 1

## 2014-11-07 MED ORDER — FENTANYL CITRATE (PF) 250 MCG/5ML IJ SOLN
INTRAMUSCULAR | Status: AC
  Filled 2014-11-07: qty 5

## 2014-11-07 MED ORDER — NEOSTIGMINE METHYLSULFATE 10 MG/10ML IV SOLN
INTRAVENOUS | Status: DC | PRN
  Administered 2014-11-07: 3 mg via INTRAVENOUS

## 2014-11-07 MED ORDER — FENTANYL CITRATE (PF) 100 MCG/2ML IJ SOLN
INTRAMUSCULAR | Status: DC | PRN
  Administered 2014-11-07: 50 ug via INTRAVENOUS
  Administered 2014-11-07 (×2): 100 ug via INTRAVENOUS

## 2014-11-07 MED ORDER — FENTANYL CITRATE (PF) 100 MCG/2ML IJ SOLN
25.0000 ug | INTRAMUSCULAR | Status: DC | PRN
  Administered 2014-11-07: 50 ug via INTRAVENOUS

## 2014-11-07 MED ORDER — ROCURONIUM BROMIDE 100 MG/10ML IV SOLN
INTRAVENOUS | Status: AC
  Filled 2014-11-07: qty 1

## 2014-11-07 MED ORDER — IBUPROFEN 800 MG PO TABS: 800.0000 mg | ORAL_TABLET | Freq: Three times a day (TID) | ORAL | Status: DC | PRN

## 2014-11-07 MED ORDER — DEXAMETHASONE SODIUM PHOSPHATE 4 MG/ML IJ SOLN
INTRAMUSCULAR | Status: AC
  Filled 2014-11-07: qty 1

## 2014-11-07 MED ORDER — SODIUM CHLORIDE 0.9 % IJ SOLN
INTRAMUSCULAR | Status: DC | PRN
  Administered 2014-11-07: 10 mL via INTRAVENOUS

## 2014-11-07 MED ORDER — ROCURONIUM BROMIDE 100 MG/10ML IV SOLN
INTRAVENOUS | Status: DC | PRN
  Administered 2014-11-07: 30 mg via INTRAVENOUS

## 2014-11-07 MED ORDER — GLYCOPYRROLATE 0.2 MG/ML IJ SOLN
INTRAMUSCULAR | Status: DC | PRN
  Administered 2014-11-07: 0.6 mg via INTRAVENOUS

## 2014-11-07 MED ORDER — SODIUM CHLORIDE 0.9 % IJ SOLN
INTRAMUSCULAR | Status: AC
  Filled 2014-11-07: qty 10

## 2014-11-07 MED ORDER — LIDOCAINE HCL (CARDIAC) 20 MG/ML IV SOLN
INTRAVENOUS | Status: AC
  Filled 2014-11-07: qty 5

## 2014-11-07 SURGICAL SUPPLY — 20 items
CATH ROBINSON RED A/P 16FR (CATHETERS) ×2 IMPLANT
CLOTH BEACON ORANGE TIMEOUT ST (SAFETY) ×2 IMPLANT
DRSG COVADERM PLUS 2X2 (GAUZE/BANDAGES/DRESSINGS) ×3 IMPLANT
DRSG OPSITE POSTOP 3X4 (GAUZE/BANDAGES/DRESSINGS) ×1 IMPLANT
DURAPREP 26ML APPLICATOR (WOUND CARE) ×2 IMPLANT
GLOVE BIOGEL PI IND STRL 7.0 (GLOVE) ×1 IMPLANT
GLOVE BIOGEL PI INDICATOR 7.0 (GLOVE) ×1
GLOVE ECLIPSE 6.5 STRL STRAW (GLOVE) ×2 IMPLANT
GOWN STRL REUS W/TWL LRG LVL3 (GOWN DISPOSABLE) ×4 IMPLANT
LIQUID BAND (GAUZE/BANDAGES/DRESSINGS) ×1 IMPLANT
NEEDLE GYRUS 33CM (NEEDLE) ×1 IMPLANT
NEEDLE INSUFFLATION 120MM (ENDOMECHANICALS) ×2 IMPLANT
PACK LAPAROSCOPY BASIN (CUSTOM PROCEDURE TRAY) ×2 IMPLANT
PAD POSITIONER PINK NONSTERILE (MISCELLANEOUS) ×2 IMPLANT
SUT VICRYL 0 UR6 27IN ABS (SUTURE) ×2 IMPLANT
SUT VICRYL 4-0 PS2 18IN ABS (SUTURE) ×2 IMPLANT
TOWEL OR 17X24 6PK STRL BLUE (TOWEL DISPOSABLE) ×4 IMPLANT
TROCAR OPTI TIP 5M 100M (ENDOMECHANICALS) ×2 IMPLANT
TROCAR XCEL DIL TIP R 11M (ENDOMECHANICALS) ×2 IMPLANT
WATER STERILE IRR 1000ML POUR (IV SOLUTION) ×2 IMPLANT

## 2014-11-07 NOTE — Transfer of Care (Signed)
Immediate Anesthesia Transfer of Care Note  Patient: Susan Montoya  Procedure(s) Performed: Procedure(s): LAPAROSCOPIC  BILATERAL TUBAL LIGATION with cautery and fulgeration of endmetrios (Bilateral)  Patient Location: PACU  Anesthesia Type:General  Level of Consciousness: awake, alert , oriented and patient cooperative  Airway & Oxygen Therapy: Patient Spontanous Breathing and Patient connected to nasal cannula oxygen  Post-op Assessment: Report given to RN, Post -op Vital signs reviewed and stable and Patient moving all extremities X 4  Post vital signs: Reviewed and stable  Last Vitals:  Filed Vitals:   11/07/14 1444  Temp: 36.8 C  Resp: 20    Complications: No apparent anesthesia complications

## 2014-11-07 NOTE — Discharge Instructions (Signed)
Warm heat to abdomen every 4 hrs x 24 hrs . Ambulate. Nothing per vagina for a week. Finish camila pill pack Laparoscopic Tubal Ligation Laparoscopic tubal ligation is a procedure that closes the fallopian tubes at a time other than right after childbirth. By closing the fallopian tubes, the eggs that are released from the ovaries cannot enter the uterus and sperm cannot reach the egg. Tubal ligation is also known as getting your "tubes tied." Tubal ligation is done so you will not be able to get pregnant or have a baby.  Although this procedure may be reversed, it should be considered permanent and irreversible. If you want to have future pregnancies, you should not have this procedure.  LET YOUR CAREGIVER KNOW ABOUT:  Allergies to food or medicine.  Medicines taken, including vitamins, herbs, eyedrops, over-the-counter medicines, and creams.  Use of steroids (by mouth or creams).  Previous problems with numbing medicines.  History of bleeding problems or blood clots.  Any recent colds or infections.  Previous surgery.  Other health problems, including diabetes and kidney problems.  Possibility of pregnancy, if this applies.  Any past pregnancies. RISKS AND COMPLICATIONS   Infection.  Bleeding.  Injury to surrounding organs.  Anesthetic side effects.  Failure of the procedure.  Ectopic pregnancy.  Future regret about having the procedure done. BEFORE THE PROCEDURE  Do not take aspirin or blood thinners a week before the procedure or as directed. This can cause bleeding.  Do not eat or drink anything 6 to 8 hours before the procedure. PROCEDURE   You may be given a medicine to help you relax (sedative) before the procedure. You will be given a medicine to make you sleep (general anesthetic) during the procedure.  A tube will be put down your throat to help your breath while under general anesthesia.  Two small cuts (incisions) are made in the lower abdominal area  and near the belly button.  Your abdominal area will be inflated with a safe gas (carbon dioxide). This helps give the surgeon room to operate, visualize, and helps the surgeon avoid other organs.  A thin, lighted tube (laparoscope) with a camera attached is inserted into your abdomen through one of the incisions near the belly button. Other small instruments are also inserted through the other abdominal incision.  The fallopian tubes are located and are either blocked with a ring, clip, or are burned (cauterized).  After the fallopian tubes are blocked, the gas is released from the abdomen.  The incisions will be closed with stitches (sutures), and a bandage may be placed over the incisions. AFTER THE PROCEDURE   You will rest in a recovery room for 1--4 hours until you are stable and doing well.  You will also have some mild abdominal discomfort for 3--7 days. You will be given pain medicine to ease any discomfort.  As long as there are no problems, you may be allowed to go home. Someone will need to drive you home and be with you for at least 24 hours once home.  You may have some mild discomfort in the throat. This is from the tube placed in your throat while you were sleeping.  You may experience discomfort in the shoulder area from some trapped air between the liver and diaphragm. This sensation is normal and will slowly go away on its own. Document Released: 07/22/2000 Document Revised: 10/15/2011 Document Reviewed: 07/27/2011 [facility] Patient Information 2015 [place], [place]. This information is not intended to replace advice given  to you by your health care provider. Make sure you discuss any questions you have with your health care provider.

## 2014-11-07 NOTE — Brief Op Note (Signed)
11/07/2014  4:49 PM  PATIENT:  Susan Montoya  39 y.o. female  PRE-OPERATIVE DIAGNOSIS:  DESIRES STERILIZATION  POST-OPERATIVE DIAGNOSIS:  DESIRES STERILIZATION, Stage 1 pelvic endometriosis  PROCEDURE:  Laparoscopic tubal ligation with bipolar cautery,  Fulguration of stage I pelvic endometriosis  SURGEON:  Surgeon(s) and Role:    * Servando Salina, MD - Primary  PHYSICIAN ASSISTANT:   ASSISTANTS: none   ANESTHESIA:   general Findings: RUQ bowel adhesions, nl tubes, nl ovaries, endometriosis post cul desac, post uterine serosal wall, on both ovaries, nl ant cul;de sac, nl uterus  EBL:  Total I/O In: 1000 [I.V.:1000] Out: 50 [Urine:50]  BLOOD ADMINISTERED:none  DRAINS: none   LOCAL MEDICATIONS USED:  MARCAINE     SPECIMEN:  No Specimen  DISPOSITION OF SPECIMEN:  N/A  COUNTS:  YES  TOURNIQUET:  * No tourniquets in log *  DICTATION: .Other Dictation: Dictation Number [id number]  PLAN OF CARE: Discharge to home after PACU  PATIENT DISPOSITION:  PACU - hemodynamically stable.   Delay start of Pharmacological VTE agent (>24hrs) due to surgical blood loss or risk of bleeding: no

## 2014-11-07 NOTE — Anesthesia Procedure Notes (Signed)
Procedure Name: Intubation Date/Time: 11/07/2014 3:33 PM Performed by: Gilmer Mor R Pre-anesthesia Checklist: Patient identified, Emergency Drugs available, Suction available, Patient being monitored and Timeout performed Patient Re-evaluated:Patient Re-evaluated prior to inductionOxygen Delivery Method: Circle system utilized Preoxygenation: Pre-oxygenation with 100% oxygen Intubation Type: IV induction Ventilation: Mask ventilation without difficulty Laryngoscope Size: Mac and 3 Grade View: Grade I Tube type: Oral Tube size: 7.0 mm Number of attempts: 1 Airway Equipment and Method: Stylet Placement Confirmation: ETT inserted through vocal cords under direct vision,  breath sounds checked- equal and bilateral and positive ETCO2 Secured at: 21 cm Tube secured with: Tape Dental Injury: Teeth and Oropharynx as per pre-operative assessment

## 2014-11-07 NOTE — Anesthesia Preprocedure Evaluation (Signed)
Anesthesia Evaluation  Patient identified by MRN, date of birth, ID band Patient awake    Reviewed: Allergy & Precautions, NPO status , Patient's Chart, lab work & pertinent test results  History of Anesthesia Complications Negative for: history of anesthetic complications  Airway Mallampati: II  TM Distance: >3 FB Neck ROM: Full    Dental no notable dental hx. (+) Dental Advisory Given   Pulmonary neg pulmonary ROS,  breath sounds clear to auscultation  Pulmonary exam normal       Cardiovascular negative cardio ROS Normal cardiovascular examRhythm:Regular Rate:Normal     Neuro/Psych PSYCHIATRIC DISORDERS Anxiety negative neurological ROS     GI/Hepatic Neg liver ROS, GERD-  Medicated and Controlled,  Endo/Other  negative endocrine ROS  Renal/GU negative Renal ROS  negative genitourinary   Musculoskeletal negative musculoskeletal ROS (+)   Abdominal   Peds negative pediatric ROS (+)  Hematology  (+) anemia , Beta thal   Anesthesia Other Findings   Reproductive/Obstetrics negative OB ROS                             Anesthesia Physical Anesthesia Plan  ASA: II  Anesthesia Plan: General   Post-op Pain Management:    Induction: Intravenous  Airway Management Planned: Oral ETT  Additional Equipment:   Intra-op Plan:   Post-operative Plan: Extubation in OR  Informed Consent: I have reviewed the patients History and Physical, chart, labs and discussed the procedure including the risks, benefits and alternatives for the proposed anesthesia with the patient or authorized representative who has indicated his/her understanding and acceptance.   Dental advisory given  Plan Discussed with: CRNA  Anesthesia Plan Comments:         Anesthesia Quick Evaluation

## 2014-11-07 NOTE — Anesthesia Postprocedure Evaluation (Signed)
  Anesthesia Post-op Note  Patient: Susan Montoya  Procedure(s) Performed: Procedure(s) (LRB): LAPAROSCOPIC  BILATERAL TUBAL LIGATION with cautery and fulgeration of endmetrios (Bilateral)  Patient Location: PACU  Anesthesia Type: General  Level of Consciousness: awake and alert   Airway and Oxygen Therapy: Patient Spontanous Breathing  Post-op Pain: mild  Post-op Assessment: Post-op Vital signs reviewed, Patient's Cardiovascular Status Stable, Respiratory Function Stable, Patent Airway and No signs of Nausea or vomiting  Last Vitals:  Filed Vitals:   11/07/14 1715  BP: 111/61  Pulse: 67  Temp:   Resp: 18    Post-op Vital Signs: stable   Complications: No apparent anesthesia complications

## 2014-11-08 ENCOUNTER — Encounter (HOSPITAL_COMMUNITY): Payer: Self-pay | Admitting: Obstetrics and Gynecology

## 2014-11-08 NOTE — Op Note (Signed)
[name], Susan Montoya                 ACCOUNT NO.:  [id number]  MEDICAL RECORD NO.:  [id number]  LOCATION:  [facility]                          FACILITY:  [facility]  PHYSICIAN:  Servando Salina, M.D.DATE OF BIRTH:  17-Aug-1975  DATE OF PROCEDURE:  11/07/2014 DATE OF DISCHARGE:  11/07/2014                              OPERATIVE REPORT   PREOPERATIVE DIAGNOSIS:  Desires sterilization.  POSTOPERATIVE DIAGNOSIS:  Desires sterilization, stage I pelvic endometriosis.  PROCEDURE:  Laparoscopic tubal ligation with bipolar cautery, fulguration of stage I pelvic endometriosis.  ANESTHESIA:  General.  SURGEON:  Servando Salina, MD.  ASSISTANT:  None.  DESCRIPTION OF PROCEDURE:  Under adequate general anesthesia, the patient was placed in the dorsal lithotomy position.  She was sterilely prepped and draped in usual fashion.  The bladder was catheterized for moderate amount of urine.  Examination under anesthesia revealed a retroverted uterus.  No adnexal masses could be appreciated.  The bivalve speculum was placed in the vagina.  Single-tooth tenaculum was placed in the anterior lip of the cervix.  An Acorn cannula was introduced into the cervical os and attached to the tenaculum for manipulation of the uterus.  The bivalve speculum was removed. Attention was then turned to the abdomen.  A 0.25% Marcaine was injected in the infraumbilical incision site.  Incision was then made.  The Veress needle was then introduced without incident.  Opening pressure of 4 was noted.  A 2.5 L of CO2 was insufflated.  Veress needle was then removed.  A 10-mm disposable trocar with sleeve was introduced into the abdomen without incident.  The lighted video laparoscope was then placed through that port.  Panoramic inspection was done.  It was notable for right upper quadrant bowel adhesions from the patient's prior surgery. The patient was then placed in Trendelenburg position.  The pelvis was inspected.  Uterus  was normal.  The appendix was not seen.  The fallopian tubes were normal.  The ovaries were normal in size.  There were endometriotic implants seen in the posterior cul-de-sac, on both ovaries and the right posterior aspect of the uterus, on the serosal surface.  A suprapubic site was then made after 0.25% Marcaine was injected.  A 5-mm port was placed under direct visualization.  Using the bipolar cautery, the midportion of both fallopian tubes were cauterized. Subsequently, using the gyrus needle point and entering to the suprapubic site with the probe being used at the umbilical area, the endometriotic implants were then fulgurated.  Once that was felt to be adequate, the suprapubic site was removed under direct visualization. The infraumbilical site was then removed.  The abdomen was deflated. The infraumbilical site fascial stitch of 0 Vicryl figure-of-eight suture was placed and subcuticular sutures were done from the skin.  The instruments in the vagina were removed.  SPECIMEN:  None.  ESTIMATED BLOOD LOSS:  Minimal.  COMPLICATION:  None.  DISPOSITION:  The patient tolerated the procedure well and was transferred to recovery in stable condition.     Servando Salina, M.D.     [place]/MEDQ  D:  11/07/2014  T:  11/08/2014  Job:  [id number]

## 2014-11-19 NOTE — H&P (Signed)
Susan Montoya is an 39 y.o. female. G3P3 BF presents for LTL w/ bipolar cautery due to desired permanent sterilization  Pertinent Gynecological History: Menses: postpartum Bleeding: none Contraception: none DES exposure: denies Blood transfusions: none Sexually transmitted diseases: HSV Previous GYN Procedures: none  Last mammogram: n/a Last pap: normal OB History: G3, P3   Menstrual History: Menarche age: n/a No LMP recorded.    Past Medical History  Diagnosis Date  . Crohn's disease     no current med.  . Beta thalassemia minor   . GERD (gastroesophageal reflux disease)     occasional  . Chronic tonsillitis 09/2013    resolved  - has tonsillectomy  . TMJ syndrome     right side  . Decreased fetal movement during pregnancy in third trimester, antepartum 08/07/2014  . Irregular uterine contractions 08/07/2014  . HSV (herpes simplex virus) anogenital infection   . Indigestion     occasional - protonix prn  . SVD (spontaneous vaginal delivery)     x 3  . Anxiety     xanax but not taking due to breast feeding     Past Surgical History  Procedure Laterality Date  . Appendectomy    . Colon resection  age 46    removal of 1 ft. large and small intestine  . Tonsillectomy N/A 10/08/2013    Procedure: TONSILLECTOMY;  Surgeon: Rozetta Nunnery, MD;  Location: [facility];  Service: ENT;  Laterality: N/A;  . Wisdom tooth extraction    . Laparoscopic tubal ligation Bilateral 11/07/2014    Procedure: LAPAROSCOPIC  BILATERAL TUBAL LIGATION with cautery and fulgeration of endmetrios;  Surgeon: Servando Salina, MD;  Location: [facility];  Service: Gynecology;  Laterality: Bilateral;    Family History  Problem Relation Age of Onset  . Cancer Mother     breast  . Hyperlipidemia Mother   . Hypertension Mother   . Hypertension Father   . [place] White syndrome Father   . Heart disease Father   . Hypertension Brother   . Hypertension Maternal Grandmother    . Anemia Maternal Grandmother     beta thalasemia minor  . Hyperlipidemia Maternal Grandfather   . Hypertension Maternal Grandfather   . Cancer Maternal Grandfather     colon  . Heart disease Maternal Grandfather   . Stroke Maternal Grandfather   . Diabetes Paternal Grandmother   . Cancer Paternal Grandmother     breast  . Diabetes Paternal Grandfather     Social History:  reports that she has never smoked. She has never used smokeless tobacco. She reports that she drinks alcohol. She reports that she does not use illicit drugs.  Allergies: No Known Allergies  No prescriptions prior to admission    Review of Systems  All other systems reviewed and are negative.   Blood pressure 119/73, pulse 71, temperature 97.4 F (36.3 C), temperature source Oral, resp. rate 14, SpO2 100 %, currently breastfeeding. Physical Exam  Constitutional: She is oriented to person, place, and time. She appears well-developed and well-nourished.  HENT:  Head: Atraumatic.  Eyes: EOM are normal.  Neck: Neck supple.  Cardiovascular: Regular rhythm.   Respiratory: Breath sounds normal.  GI: Bowel sounds are normal.  Neurological: She is alert and oriented to person, place, and time.  Skin: Skin is warm and dry.  Psychiatric: She has a normal mood and affect.    No results found for this or any previous visit (from the past 24 hour(s)).  No results found.  Assessment/Plan: Desires sterilization P) LTL w/ bipolar cautery. Risk of surgery reviewed including permanence, nonreversible, failure rate 1/500-1/600, infection, bleeding, injury to underlying organ structures. ALL ? answered

## 2015-05-05 ENCOUNTER — Telehealth: Payer: Self-pay | Admitting: Family

## 2015-05-05 NOTE — Telephone Encounter (Signed)
Fine with me

## 2015-05-05 NOTE — Telephone Encounter (Signed)
Dr Sharlet Salina,  This patient is an existing patient of Dutch Quint @ [facility]. She was instructed to find a new PCP due to Padonda's absence. She is requesting to be under your care, per a reference from her coworker, who is your patient. Is this something that you are ok with?   Thanks so much

## 2015-06-19 ENCOUNTER — Telehealth: Payer: Self-pay | Admitting: Behavioral Health

## 2015-06-19 ENCOUNTER — Encounter: Payer: Self-pay | Admitting: Behavioral Health

## 2015-06-19 NOTE — Telephone Encounter (Signed)
Pre-Visit Call completed with patient and chart updated.   Pre-Visit Info documented in Specialty Comments under [employer].    

## 2015-06-20 ENCOUNTER — Encounter: Payer: Self-pay | Admitting: Family

## 2015-06-20 ENCOUNTER — Ambulatory Visit (INDEPENDENT_AMBULATORY_CARE_PROVIDER_SITE_OTHER): Payer: 59 | Admitting: Family

## 2015-06-20 VITALS — BP 110/76 | HR 67 | Temp 97.8°F | Resp 16 | Ht 62.75 in | Wt 133.8 lb

## 2015-06-20 DIAGNOSIS — K50119 Crohn's disease of large intestine with unspecified complications: Secondary | ICD-10-CM

## 2015-06-20 DIAGNOSIS — D563 Thalassemia minor: Secondary | ICD-10-CM | POA: Diagnosis not present

## 2015-06-20 DIAGNOSIS — K50919 Crohn's disease, unspecified, with unspecified complications: Secondary | ICD-10-CM | POA: Diagnosis not present

## 2015-06-20 DIAGNOSIS — D649 Anemia, unspecified: Secondary | ICD-10-CM

## 2015-06-20 DIAGNOSIS — B009 Herpesviral infection, unspecified: Secondary | ICD-10-CM

## 2015-06-20 DIAGNOSIS — F411 Generalized anxiety disorder: Secondary | ICD-10-CM

## 2015-06-20 LAB — FERRITIN: Ferritin: 15.9 ng/mL (ref 10.0–291.0)

## 2015-06-20 LAB — CBC WITH DIFFERENTIAL/PLATELET
Basophils Absolute: 0 10*3/uL (ref 0.0–0.1)
Basophils Relative: 0.5 % (ref 0.0–3.0)
EOS ABS: 0.1 10*3/uL (ref 0.0–0.7)
EOS PCT: 1.3 % (ref 0.0–5.0)
HCT: 33.1 % — ABNORMAL LOW (ref 36.0–46.0)
Hemoglobin: 10.6 g/dL — ABNORMAL LOW (ref 12.0–15.0)
LYMPHS ABS: 1.7 10*3/uL (ref 0.7–4.0)
Lymphocytes Relative: 20.2 % (ref 12.0–46.0)
MCHC: 32 g/dL (ref 30.0–36.0)
MCV: 63.2 fl — ABNORMAL LOW (ref 78.0–100.0)
Monocytes Absolute: 0.5 10*3/uL (ref 0.1–1.0)
Monocytes Relative: 5.7 % (ref 3.0–12.0)
NEUTROS PCT: 72.3 % (ref 43.0–77.0)
Neutro Abs: 6.1 10*3/uL (ref 1.4–7.7)
Platelets: 267 10*3/uL (ref 150.0–400.0)
RBC: 5.23 Mil/uL — AB (ref 3.87–5.11)
RDW: 15.6 % — AB (ref 11.5–15.5)
WBC: 8.5 10*3/uL (ref 4.0–10.5)

## 2015-06-20 LAB — IRON: IRON: 114 ug/dL (ref 42–145)

## 2015-06-20 NOTE — Progress Notes (Signed)
Subjective:    Patient ID: Susan Montoya, female    DOB: Jun 25, 1975, 40 y.o.   MRN: [id number]  HPI  Susan Montoya is a 40 yr old female who presents today to establish care.  She was previously followed by Roxy Cedar NP at our [place] office. She reports no specific concerns today.   Pmhx is significant for the following:  Crohn's disease-Diagnosed at age 89, has seen Dr. Deatra Ina. Denies any recent issues from a GI standpoint.  She has had right hemicolectomy and resection f the terminal ileum 2007.    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   Anxiety- reports that her mom has always had anxiety. Reports that she rarely uses xanax as needed for panic attacks.    Review of Systems See HPI  Past Medical History  Diagnosis Date  . Crohn's disease ([place])     no current med.  . Beta thalassemia minor   . GERD (gastroesophageal reflux disease)     occasional  . Chronic tonsillitis 09/2013    resolved  - has tonsillectomy  . TMJ syndrome     right side  . Decreased fetal movement during pregnancy in third trimester, antepartum 08/07/2014  . Irregular uterine contractions 08/07/2014  . HSV (herpes simplex virus) anogenital infection   . Indigestion     occasional - protonix prn  . SVD (spontaneous vaginal delivery)     x 3  . Anxiety     xanax but not taking due to breast feeding     Social History   Social History  . Marital Status: Significant Other    Spouse Name: N/A  . Number of Children: N/A  . Years of Education: N/A   Occupational History  . Not on file.   Social History Main Topics  . Smoking status: Never Smoker   . Smokeless tobacco: Never Used  . Alcohol Use: 0.6 - 1.2 oz/week    1-2 Glasses of wine per week     Comment: occasionally  .  Drug Use: No  . Sexual Activity: Yes    Birth Control/ Protection: Pill   Other Topics Concern  . Not on file   Social History Narrative    Past Surgical History  Procedure Laterality Date  . Appendectomy    . Colon resection  age 17    removal of 1 ft. large and small intestine  . Tonsillectomy N/A 10/08/2013    Procedure: TONSILLECTOMY;  Surgeon: Rozetta Nunnery, MD;  Location: [place];  Service: ENT;  Laterality: N/A;  . Wisdom tooth extraction    . Laparoscopic tubal ligation Bilateral 11/07/2014    Procedure: LAPAROSCOPIC  BILATERAL TUBAL LIGATION with cautery and fulgeration of endmetrios;  Surgeon: Servando Salina, MD;  Location: [facility];  Service: Gynecology;  Laterality: Bilateral;    Family History  Problem Relation Age of Onset  . Cancer Mother     breast  . Hyperlipidemia Mother   . Hypertension Mother   . Hypertension Father   . [place] White syndrome Father   . Heart disease Father   . Hypertension Brother   . Hypertension Maternal Grandmother   . Anemia Maternal Grandmother     beta thalasemia minor  .  Hyperlipidemia Maternal Grandfather   . Hypertension Maternal Grandfather   . Cancer Maternal Grandfather     colon  . Heart disease Maternal Grandfather   . Stroke Maternal Grandfather   . Diabetes Paternal Grandmother   . Cancer Paternal Grandmother     breast  . Diabetes Paternal Grandfather     No Known Allergies  Current Outpatient Prescriptions on File Prior to Visit  Medication Sig Dispense Refill  . acetaminophen (TYLENOL) 325 MG tablet Take 650 mg by mouth every 6 (six) hours as needed for mild pain.    . cetirizine (ZYRTEC) 10 MG tablet Take 10 mg by mouth daily.    Marland Kitchen ibuprofen (ADVIL,MOTRIN) 800 MG tablet Take 1 tablet (800 mg total) by mouth every 8 (eight) hours as needed. 30 tablet 0  . Prenatal Vit-Fe Fumarate-FA (PRENATAL MULTIVITAMIN) TABS Take 1 tablet by mouth daily at 12 noon.     . Probiotic  Product (PROBIOTIC PO) Take 1 tablet by mouth once a week.     . ValACYclovir HCl (VALTREX PO) Take by mouth as needed.      No current facility-administered medications on file prior to visit.    BP 110/76 mmHg  Pulse 67  Temp(Src) 97.8 F (36.6 C) (Oral)  Resp 16  Ht 5' 2.75" (1.594 m)  Wt 133 lb 12.8 oz (60.691 kg)  BMI 23.89 kg/m2  SpO2 97%  LMP 04/28/2015       Objective:   Physical Exam  Constitutional: She is oriented to person, place, and time. She appears well-developed and well-nourished.  HENT:  Head: Normocephalic and atraumatic.  Cardiovascular: Normal rate, regular rhythm and normal heart sounds.   No murmur heard. Pulmonary/Chest: Effort normal and breath sounds normal. No respiratory distress. She has no wheezes.  Musculoskeletal: She exhibits no edema.  Neurological: She is alert and oriented to person, place, and time.  Psychiatric: She has a normal mood and affect. Her behavior is normal. Judgment and thought content normal.          Assessment & Plan:

## 2015-06-20 NOTE — Assessment & Plan Note (Signed)
Currently stable. Has not been in to see GI in 5 years. Will arrange consult for ongoing management.

## 2015-06-20 NOTE — Assessment & Plan Note (Addendum)
Stable, no outbreaks.

## 2015-06-20 NOTE — Assessment & Plan Note (Signed)
Stable with rare use of xanax.

## 2015-06-20 NOTE — Patient Instructions (Addendum)
Please complete lab work prior to leaving. Schedule complete physical at the front desk.

## 2015-06-20 NOTE — Progress Notes (Signed)
Pre visit review using our clinic review tool, if applicable. No additional management support is needed unless otherwise documented below in the visit note. 

## 2015-06-20 NOTE — Assessment & Plan Note (Signed)
Obtain CBC, iron/ferritin.

## 2015-06-22 ENCOUNTER — Telehealth: Payer: Self-pay | Admitting: Family

## 2015-06-22 DIAGNOSIS — D649 Anemia, unspecified: Secondary | ICD-10-CM

## 2015-06-22 MED ORDER — FERROUS SULFATE 325 (65 FE) MG PO TABS: 325.0000 mg | ORAL_TABLET | Freq: Every day | ORAL | Status: DC

## 2015-06-22 MED ORDER — FOLIC ACID 1 MG PO TABS: 1.0000 mg | ORAL_TABLET | Freq: Every day | ORAL | Status: DC

## 2015-06-22 NOTE — Telephone Encounter (Signed)
I reviewed her lab work.  She is still anemic.  I would recommend that she add iron 356m once daily.  Also add folic acid 130monce daily.  Repeat CBC, Ferritin in 3 mos dx anemia.

## 2015-06-27 NOTE — Telephone Encounter (Signed)
Attempted to reach pt and left message to check mychart acct for the following instructions and to let us know if she has any problems.

## 2015-07-26 ENCOUNTER — Ambulatory Visit (INDEPENDENT_AMBULATORY_CARE_PROVIDER_SITE_OTHER): Payer: 59 | Admitting: Family

## 2015-07-26 ENCOUNTER — Encounter: Payer: Self-pay | Admitting: Family

## 2015-07-26 VITALS — BP 90/64 | HR 66 | Temp 98.1°F | Resp 16 | Ht 62.75 in | Wt 132.2 lb

## 2015-07-26 DIAGNOSIS — R059 Cough, unspecified: Secondary | ICD-10-CM

## 2015-07-26 DIAGNOSIS — R05 Cough: Secondary | ICD-10-CM | POA: Diagnosis not present

## 2015-07-26 DIAGNOSIS — R6889 Other general symptoms and signs: Secondary | ICD-10-CM

## 2015-07-26 DIAGNOSIS — Z0000 Encounter for general adult medical examination without abnormal findings: Secondary | ICD-10-CM

## 2015-07-26 DIAGNOSIS — Z0001 Encounter for general adult medical examination with abnormal findings: Secondary | ICD-10-CM

## 2015-07-26 LAB — BASIC METABOLIC PANEL
BUN: 12 mg/dL (ref 6–23)
CHLORIDE: 103 meq/L (ref 96–112)
CO2: 29 mEq/L (ref 19–32)
Calcium: 9.5 mg/dL (ref 8.4–10.5)
Creatinine, Ser: 0.73 mg/dL (ref 0.40–1.20)
GFR: 93.79 mL/min (ref >60.00)
Glucose, Bld: 93 mg/dL (ref 70–99)
Potassium: 3.7 mEq/L (ref 3.5–5.1)
SODIUM: 137 meq/L (ref 135–145)

## 2015-07-26 LAB — CBC WITH DIFFERENTIAL/PLATELET
BASOS ABS: 0 10*3/uL (ref 0.0–0.1)
Basophils Relative: 0.4 % (ref 0.0–3.0)
Eosinophils Absolute: 0.1 10*3/uL (ref 0.0–0.7)
Eosinophils Relative: 1.5 % (ref 0.0–5.0)
HEMATOCRIT: 34.8 % — AB (ref 36.0–46.0)
Hemoglobin: 10.7 g/dL — ABNORMAL LOW (ref 12.0–15.0)
LYMPHS ABS: 1.6 10*3/uL (ref 0.7–4.0)
LYMPHS PCT: 23.8 % (ref 12.0–46.0)
MCHC: 30.8 g/dL (ref 30.0–36.0)
MCV: 64.2 fl — ABNORMAL LOW (ref 78.0–100.0)
Monocytes Absolute: 0.3 10*3/uL (ref 0.1–1.0)
Monocytes Relative: 4.5 % (ref 3.0–12.0)
NEUTROS PCT: 69.8 % (ref 43.0–77.0)
Neutro Abs: 4.8 10*3/uL (ref 1.4–7.7)
PLATELETS: 303 10*3/uL (ref 150.0–400.0)
RBC: 5.43 Mil/uL — ABNORMAL HIGH (ref 3.87–5.11)
RDW: 15.6 % — ABNORMAL HIGH (ref 11.5–15.5)
WBC: 6.9 10*3/uL (ref 4.0–10.5)

## 2015-07-26 LAB — LIPID PANEL
CHOLESTEROL: 132 mg/dL (ref 0–200)
HDL: 68.8 mg/dL (ref >39.00)
LDL CALC: 53 mg/dL (ref 0–99)
NonHDL: 63.31
Total CHOL/HDL Ratio: 2
Triglycerides: 52 mg/dL (ref 0.0–149.0)
VLDL: 10.4 mg/dL (ref 0.0–40.0)

## 2015-07-26 LAB — URINALYSIS, ROUTINE W REFLEX MICROSCOPIC
BILIRUBIN URINE: NEGATIVE
Ketones, ur: NEGATIVE
LEUKOCYTES UA: NEGATIVE
NITRITE: NEGATIVE
PH: 6 (ref 5.0–8.0)
Specific Gravity, Urine: 1.015 (ref 1.000–1.030)
Total Protein, Urine: NEGATIVE
UROBILINOGEN UA: 0.2 (ref 0.0–1.0)
Urine Glucose: NEGATIVE

## 2015-07-26 LAB — HEPATIC FUNCTION PANEL
ALK PHOS: 64 U/L (ref 39–117)
ALT: 8 U/L (ref 0–35)
AST: 15 U/L (ref 0–37)
Albumin: 4.4 g/dL (ref 3.5–5.2)
BILIRUBIN DIRECT: 0.1 mg/dL (ref 0.0–0.3)
BILIRUBIN TOTAL: 0.5 mg/dL (ref 0.2–1.2)
Total Protein: 7.8 g/dL (ref 6.0–8.3)

## 2015-07-26 LAB — POC INFLUENZA A&B (BINAX/QUICKVUE)
INFLUENZA B, POC: NEGATIVE
Influenza A, POC: NEGATIVE

## 2015-07-26 LAB — TSH: TSH: 1.07 u[IU]/mL (ref 0.35–4.50)

## 2015-07-26 MED ORDER — ALPRAZOLAM 0.5 MG PO TABS: ORAL_TABLET | ORAL | Status: DC

## 2015-07-26 MED ORDER — PANTOPRAZOLE SODIUM 40 MG PO TBEC: 40.0000 mg | DELAYED_RELEASE_TABLET | Freq: Every day | ORAL | Status: DC | PRN

## 2015-07-26 MED FILL — ALPRAZolam 0.5 MG TABS: 0.5 | 30 days supply | Qty: 30 | Fill #0

## 2015-07-26 MED FILL — PANTOPRAZOLE SOD DR 40 MG T: 40 | 90 days supply | Qty: 90 | Fill #0

## 2015-07-26 NOTE — Progress Notes (Signed)
Pre visit review using our clinic review tool, if applicable. No additional management support is needed unless otherwise documented below in the visit note. 

## 2015-07-26 NOTE — Progress Notes (Signed)
Subjective:    Patient ID: Susan Montoya, female    DOB: 05-29-1975, 40 y.o.   MRN: [id number]  HPI  Patient presents today for complete physical.  Immunizations: tetanus up to date Diet: eating healthy Exercise: walking  Colonoscopy: up to date- Dr. Deatra Ina Pap Smear: Sees Dr. Garwin Brothers, due in May.  Mammogram: due Dental: UTD Vision:  due  Reports mild uri symptoms/sore throat. + flu at work. Requests flu swab.   Review of Systems  Constitutional: Positive for fatigue. Negative for fever.  HENT: Positive for sinus pressure. Negative for hearing loss.   Eyes: Negative for visual disturbance.  Respiratory: Negative for cough, shortness of breath and wheezing.   Cardiovascular: Negative for chest pain.       Reports that she has occasional palpitations which she attributes to anxiety  Gastrointestinal: Negative for nausea, abdominal pain and diarrhea.  Genitourinary: Negative for urgency and frequency.       Periods a little irregular  Musculoskeletal: Negative for joint swelling.  Neurological: Negative for headaches.  Psychiatric/Behavioral:       Denies depression Some anxiety due to kids and recent job change       Past Medical History  Diagnosis Date  . Crohn's disease ([place])     no current med.  . Beta thalassemia minor   . GERD (gastroesophageal reflux disease)     occasional  . Chronic tonsillitis 09/2013    resolved  - has tonsillectomy  . TMJ syndrome     right side  . Decreased fetal movement during pregnancy in third trimester, antepartum 08/07/2014  . Irregular uterine contractions 08/07/2014  . HSV (herpes simplex virus) anogenital infection   . Indigestion     occasional - protonix prn  . SVD (spontaneous vaginal delivery)     x 3  . Anxiety     xanax but not taking due to breast feeding   . Beta thalassemia minor     Social History   Social History  . Marital Status: Significant Other    Spouse Name: N/A  . Number of Children: N/A  . Years  of Education: N/A   Occupational History  . Not on file.   Social History Main Topics  . Smoking status: Never Smoker   . Smokeless tobacco: Never Used  . Alcohol Use: 0.6 - 1.2 oz/week    1-2 Glasses of wine per week     Comment: occasionally  . Drug Use: No  . Sexual Activity: Yes    Birth Control/ Protection: Pill   Other Topics Concern  . Not on file   Social History Narrative   2001- son   2014-daughter   83- daughter   Works as L and D nurse   Engaged   Has been married twice in the past.   Enjoys running, baking       Past Surgical History  Procedure Laterality Date  . Appendectomy    . Colon resection  age 26    removal of 1 ft. large and small intestine  . Tonsillectomy N/A 10/08/2013    Procedure: TONSILLECTOMY;  Surgeon: Rozetta Nunnery, MD;  Location: [facility];  Service: ENT;  Laterality: N/A;  . Wisdom tooth extraction    . Laparoscopic tubal ligation Bilateral 11/07/2014    Procedure: LAPAROSCOPIC  BILATERAL TUBAL LIGATION with cautery and fulgeration of endmetrios;  Surgeon: Servando Salina, MD;  Location: [facility];  Service: Gynecology;  Laterality: Bilateral;    Family History  Problem Relation Age of Onset  . Cancer Mother     breast  . Hyperlipidemia Mother   . Hypertension Mother   . Hypertension Father   . [place] White syndrome Father   . Heart disease Father   . Hypertension Brother   . Hypertension Maternal Grandmother   . Anemia Maternal Grandmother     beta thalasemia minor  . Hyperlipidemia Maternal Grandfather   . Hypertension Maternal Grandfather   . Cancer Maternal Grandfather     colon  . Heart disease Maternal Grandfather   . Stroke Maternal Grandfather   . Diabetes Paternal Grandmother   . Cancer Paternal Grandmother     breast  . Diabetes Paternal Grandfather     No Known Allergies  Current Outpatient Prescriptions on File Prior to Visit  Medication Sig Dispense Refill  .  acetaminophen (TYLENOL) 325 MG tablet Take 650 mg by mouth every 6 (six) hours as needed for mild pain.    Marland Kitchen ALPRAZolam (XANAX) 0.5 MG tablet Take 1/2 to 1 tablet daily as needed.    . cetirizine (ZYRTEC) 10 MG tablet Take 10 mg by mouth daily.    . folic acid (FOLVITE) 1 MG tablet Take 1 tablet (1 mg total) by mouth daily.    Marland Kitchen ibuprofen (ADVIL,MOTRIN) 800 MG tablet Take 1 tablet (800 mg total) by mouth every 8 (eight) hours as needed. 30 tablet 0  . pantoprazole (PROTONIX) 40 MG tablet Take 40 mg by mouth daily as needed.    . Prenatal Vit-Fe Fumarate-FA (PRENATAL MULTIVITAMIN) TABS Take 1 tablet by mouth daily at 12 noon.     . Probiotic Product (PROBIOTIC PO) Take 1 tablet by mouth once a week.     . ValACYclovir HCl (VALTREX PO) Take by mouth as needed.     . ferrous sulfate (FERROUSUL) 325 (65 FE) MG tablet Take 1 tablet (325 mg total) by mouth daily with breakfast. (Patient not taking: Reported on 07/26/2015) 30 tablet 3   No current facility-administered medications on file prior to visit.    BP 90/64 mmHg  Pulse 66  Temp(Src) 98.1 F (36.7 C) (Oral)  Resp 16  Ht 5' 2.75" (1.594 m)  Wt 132 lb 3.2 oz (59.966 kg)  BMI 23.60 kg/m2  SpO2 98%  LMP 07/21/2015    Objective:   Physical Exam Physical Exam  Constitutional: She is oriented to person, place, and time. She appears well-developed and well-nourished. No distress.  HENT:  Head: Normocephalic and atraumatic.  Right Ear: Tympanic membrane and ear canal normal.  Left Ear: Tympanic membrane and ear canal normal.  Mouth/Throat: Oropharynx is clear and moist.  Eyes: Pupils are equal, round, and reactive to light. No scleral icterus.  Neck: Normal range of motion. No thyromegaly present.  Cardiovascular: Normal rate and regular rhythm.   No murmur heard. Pulmonary/Chest: Effort normal and breath sounds normal. No respiratory distress. He has no wheezes. She has no rales. She exhibits no tenderness.  Abdominal: Soft. Bowel  sounds are normal. He exhibits no distension and no mass. There is no tenderness. There is no rebound and no guarding.  Musculoskeletal: She exhibits no edema.  Lymphadenopathy:    She has no cervical adenopathy.  Neurological: She is alert and oriented to person, place, and time. She has normal patellar reflexes. She exhibits normal muscle tone. Coordination normal.  Skin: Skin is warm and dry.  Psychiatric: She has a normal mood and affect. Her behavior is normal. Judgment and thought content normal.  Breasts: Examined lying Right: Without masses, retractions, discharge or axillary adenopathy.  Left: Without masses, retractions, discharge or axillary adenopathy.        Assessment & Plan:          Assessment & Plan:  Preventative care- continue healthy diet, exercise. Obtain routine labs. EKG tracing is personally reviewed.  EKG notes NSR.  No acute changes.  She is advised to follow up with her GYN re: scheduling follow up mammogram.   URI symptoms- Rapid flu is performed and is negative.

## 2015-07-26 NOTE — Patient Instructions (Signed)
Please complete lab work prior to leaving. Schedule routine eye exam. Please contact [facility] to schedule your follow up mammogram.  Follow up in 1 year, sooner if problems or concerns.

## 2015-07-27 NOTE — Addendum Note (Signed)
Addended by: Dorrene German on: 07/27/2015 02:29 PM   Modules accepted: Miquel Dunn

## 2015-07-28 ENCOUNTER — Other Ambulatory Visit (INDEPENDENT_AMBULATORY_CARE_PROVIDER_SITE_OTHER): Payer: 59

## 2015-07-28 DIAGNOSIS — D649 Anemia, unspecified: Secondary | ICD-10-CM | POA: Diagnosis not present

## 2015-07-28 LAB — FERRITIN: FERRITIN: 18.1 ng/mL (ref 10.0–291.0)

## 2015-07-28 LAB — IRON: Iron: 51 ug/dL (ref 42–145)

## 2015-08-08 ENCOUNTER — Other Ambulatory Visit: Payer: Self-pay | Admitting: Family

## 2015-08-08 DIAGNOSIS — Z1231 Encounter for screening mammogram for malignant neoplasm of breast: Secondary | ICD-10-CM

## 2015-10-03 DIAGNOSIS — Z01419 Encounter for gynecological examination (general) (routine) without abnormal findings: Secondary | ICD-10-CM | POA: Diagnosis not present

## 2015-10-03 DIAGNOSIS — Z6824 Body mass index (BMI) 24.0-24.9, adult: Secondary | ICD-10-CM | POA: Diagnosis not present

## 2015-10-03 DIAGNOSIS — Z1151 Encounter for screening for human papillomavirus (HPV): Secondary | ICD-10-CM | POA: Diagnosis not present

## 2015-11-07 ENCOUNTER — Ambulatory Visit (HOSPITAL_BASED_OUTPATIENT_CLINIC_OR_DEPARTMENT_OTHER): Payer: 59

## 2015-11-07 ENCOUNTER — Ambulatory Visit (HOSPITAL_BASED_OUTPATIENT_CLINIC_OR_DEPARTMENT_OTHER)
Admission: RE | Admit: 2015-11-07 | Discharge: 2015-11-07 | Disposition: A | Discharge status: Home / Self Care | Payer: 59 | Source: Ambulatory Visit | Attending: Family | Admitting: Family

## 2015-11-07 DIAGNOSIS — Z1231 Encounter for screening mammogram for malignant neoplasm of breast: Secondary | ICD-10-CM | POA: Insufficient documentation

## 2016-03-13 ENCOUNTER — Encounter: Payer: Self-pay | Admitting: Family Medicine

## 2016-03-13 ENCOUNTER — Ambulatory Visit (HOSPITAL_BASED_OUTPATIENT_CLINIC_OR_DEPARTMENT_OTHER)
Admission: RE | Admit: 2016-03-13 | Discharge: 2016-03-13 | Disposition: A | Discharge status: Home / Self Care | Payer: 59 | Source: Ambulatory Visit | Attending: Family Medicine | Admitting: Family Medicine

## 2016-03-13 ENCOUNTER — Ambulatory Visit (INDEPENDENT_AMBULATORY_CARE_PROVIDER_SITE_OTHER): Payer: 59 | Admitting: Family Medicine

## 2016-03-13 VITALS — BP 110/70 | HR 80 | Temp 97.9°F | Ht 63.0 in | Wt 134.6 lb

## 2016-03-13 DIAGNOSIS — M79642 Pain in left hand: Secondary | ICD-10-CM

## 2016-03-13 DIAGNOSIS — M79641 Pain in right hand: Secondary | ICD-10-CM | POA: Insufficient documentation

## 2016-03-13 DIAGNOSIS — M7989 Other specified soft tissue disorders: Secondary | ICD-10-CM | POA: Diagnosis not present

## 2016-03-13 LAB — URIC ACID: Uric Acid, Serum: 3.9 mg/dL (ref 2.4–7.0)

## 2016-03-13 LAB — C-REACTIVE PROTEIN: CRP: 0.4 mg/dL — AB (ref 0.5–20.0)

## 2016-03-13 LAB — SEDIMENTATION RATE: Sed Rate: 11 mm/hr (ref 0–20)

## 2016-03-13 NOTE — Progress Notes (Signed)
Pre visit review using our clinic review tool, if applicable. No additional management support is needed unless otherwise documented below in the visit note. 

## 2016-03-13 NOTE — Progress Notes (Signed)
Musculoskeletal Exam  Patient: Susan Montoya DOB: 01/21/1976  DOS: 03/13/2016  SUBJECTIVE:  Chief Complaint:   Chief Complaint  Patient presents with  . Hand Pain    Pt reports joint and hand pain/Pt states she has pain inthe thumb and fingers and having issues holding grasping and opening jars. Pt reports inflammation in the hands x3-4 months    Susan Montoya is a 40 y.o.  female for evaluation and treatment of b/l hand pain.   Onset:  2-3 months ago. No injury or change in activity- sudden.  Location: joints of fingers (MCP's and PIP's) Lasts for a couple days- no known triggers Character:  aching  Progression of issue: Frequency is improving, not currently having the issue Associated symptoms: swelling Treatment: to date has been OTC NSAIDS.   Neurovascular symptoms: no Denies rashes, sores in mouth, SOB. +famhx of arthritis, unsure if autoimmune related or not.  ROS: Musculoskeletal/Extremities: +hand pain Neurologic: no numbness, tingling no weakness   Past Medical History:  Diagnosis Date  . Anxiety    xanax but not taking due to breast feeding   . Beta thalassemia minor   . Beta thalassemia minor   . Chronic tonsillitis 09/2013   resolved  - has tonsillectomy  . Crohn's disease ([place])    no current med.  . Decreased fetal movement during pregnancy in third trimester, antepartum 08/07/2014  . GERD (gastroesophageal reflux disease)    occasional  . HSV (herpes simplex virus) anogenital infection   . Indigestion    occasional - protonix prn  . Irregular uterine contractions 08/07/2014  . SVD (spontaneous vaginal delivery)    x 3  . TMJ syndrome    right side   Past Surgical History:  Procedure Laterality Date  . APPENDECTOMY    . COLON RESECTION  age 58   removal of 1 ft. large and small intestine  . LAPAROSCOPIC TUBAL LIGATION Bilateral 11/07/2014   Procedure: LAPAROSCOPIC  BILATERAL TUBAL LIGATION with cautery and fulgeration of endmetrios;  Surgeon:  Servando Salina, MD;  Location: [facility];  Service: Gynecology;  Laterality: Bilateral;  . TONSILLECTOMY N/A 10/08/2013   Procedure: TONSILLECTOMY;  Surgeon: Rozetta Nunnery, MD;  Location: [facility];  Service: ENT;  Laterality: N/A;  . WISDOM TOOTH EXTRACTION     Family History  Problem Relation Age of Onset  . Cancer Mother     breast  . Hyperlipidemia Mother   . Hypertension Mother   . Hypertension Father   . [place] White syndrome Father   . Heart disease Father   . Hypertension Brother   . Hypertension Maternal Grandmother   . Anemia Maternal Grandmother     beta thalasemia minor  . Hyperlipidemia Maternal Grandfather   . Hypertension Maternal Grandfather   . Cancer Maternal Grandfather     colon  . Heart disease Maternal Grandfather   . Stroke Maternal Grandfather   . Diabetes Paternal Grandmother   . Cancer Paternal Grandmother     breast  . Diabetes Paternal Grandfather    Current Outpatient Prescriptions  Medication Sig Dispense Refill  . acetaminophen (TYLENOL) 325 MG tablet Take 650 mg by mouth every 6 (six) hours as needed for mild pain.    Marland Kitchen ALPRAZolam (XANAX) 0.5 MG tablet Take 1/2 to 1 tablet daily as needed. 30 tablet 0  . folic acid (FOLVITE) 1 MG tablet Take 1 tablet (1 mg total) by mouth daily.    Marland Kitchen ibuprofen (ADVIL,MOTRIN) 800 MG tablet  Take 1 tablet (800 mg total) by mouth every 8 (eight) hours as needed. 30 tablet 0  . pantoprazole (PROTONIX) 40 MG tablet Take 1 tablet (40 mg total) by mouth daily as needed. 30 tablet 3  . Probiotic Product (PROBIOTIC PO) Take 1 tablet by mouth once a week.     . ValACYclovir HCl (VALTREX PO) Take by mouth as needed.     . cetirizine (ZYRTEC) 10 MG tablet Take 10 mg by mouth daily.    . ferrous sulfate (FERROUSUL) 325 (65 FE) MG tablet Take 1 tablet (325 mg total) by mouth daily with breakfast. (Patient not taking: Reported on 03/13/2016) 30 tablet 3  . Prenatal Vit-Fe Fumarate-FA (PRENATAL  MULTIVITAMIN) TABS Take 1 tablet by mouth daily at 12 noon.      No Known Allergies Social History   Social History  . Marital status: Significant Other   Social History Main Topics  . Smoking status: Never Smoker  . Smokeless tobacco: Never Used  . Alcohol use 0.6 - 1.2 oz/week    1 - 2 Glasses of wine per week     Comment: occasionally  . Drug use: No  . Sexual activity: Yes    Birth control/ protection: Pill   Social History Narrative   2001- son   2014-daughter   29- daughter   Works as L and D Audiological scientist   Has been married twice in the past.   Enjoys running, baking       Objective: VITAL SIGNS: BP 110/70 (BP Location: Left Arm, Patient Position: Sitting, Cuff Size: Small)   Pulse 80   Temp 97.9 F (36.6 C) (Oral)   Ht 5' 3"  (1.6 m)   Wt 134 lb 9.6 oz (61.1 kg)   LMP 02/23/2016   SpO2 99%   BMI 23.84 kg/m  Constitutional: Well formed, well developed. No acute distress. Cardiovascular: Brisk cap refill, RRR, no murmurs Thorax & Lungs: No accessory muscle use. CTAB. Extremities: No clubbing. No cyanosis. No edema.  Skin: Warm. Dry. No erythema. No rash.  Musculoskeletal: Hands.   Normal active range of motion: yes.   Normal passive range of motion: yes Tenderness to palpation: no Deformity: no Ecchymosis: no No redness, warmth, swelling, or tenderness over any of her MCP or IP joints b/l. Normal ROM. Neurologic: Normal sensory function. No focal deficits noted.  Psychiatric: Normal mood. Age appropriate judgment and insight. Alert & oriented x 3.    Assessment:  Pain in both hands - Plan: Sed Rate (ESR), C-reactive protein, Uric acid, Antinuclear Antib (ANA), Rheumatoid Factor, Cyclic citrul peptide antibody, IgG, Sjogrens syndrome-A extractable nuclear antibody, Sjogrens syndrome-B extractable nuclear antibody, Anti-DNA antibody, double-stranded, DG Hand Complete Left, DG Hand Complete Right  Plan: Orders as above. XR to r/o erosive disease. Pt  told to assume her XR is normal if she doesn't hear anything. Will contact if anything arises. Not a bad idea to come in while she is having an episode. F/u prn otherwise. The patient voiced understanding and agreement to the plan.   [place], DO 03/13/16  12:02 PM

## 2016-03-14 LAB — ANTI-DNA ANTIBODY, DOUBLE-STRANDED

## 2016-03-14 LAB — ANA: Anti Nuclear Antibody(ANA): NEGATIVE

## 2016-03-14 LAB — RHEUMATOID FACTOR: Rhuematoid fact SerPl-aCnc: <14 IU/mL (ref <14)

## 2016-03-14 LAB — CYCLIC CITRUL PEPTIDE ANTIBODY, IGG: Cyclic Citrullin Peptide Ab: <16 Units

## 2016-03-14 LAB — SJOGRENS SYNDROME-B EXTRACTABLE NUCLEAR ANTIBODY: SSB (La) (ENA) Antibody, IgG: <1

## 2016-03-14 LAB — SJOGRENS SYNDROME-A EXTRACTABLE NUCLEAR ANTIBODY: SSA (Ro) (ENA) Antibody, IgG: <1

## 2016-03-15 ENCOUNTER — Telehealth: Payer: Self-pay | Admitting: Family

## 2016-03-15 NOTE — Telephone Encounter (Signed)
LVM for pt to return call (pt was approved to transfer from [facility] to Dr Nani Ravens) approved by both providers. Ok to give appt for pt to est with [place].

## 2016-03-28 NOTE — Telephone Encounter (Signed)
Patient scheduled for new patient appointment 04/03/16 with Dr. Nani Ravens

## 2016-04-02 ENCOUNTER — Telehealth: Payer: Self-pay

## 2016-04-02 NOTE — Telephone Encounter (Signed)
Attempted to review and update patients chart for appointment scheduled for tomorrow. Patient states that everything in her chart is up-to-date. Did not want to go over information.

## 2016-04-03 ENCOUNTER — Encounter: Payer: Self-pay | Admitting: Family Medicine

## 2016-04-03 ENCOUNTER — Ambulatory Visit (INDEPENDENT_AMBULATORY_CARE_PROVIDER_SITE_OTHER): Payer: 59 | Admitting: Family Medicine

## 2016-04-03 VITALS — BP 121/70 | HR 72 | Temp 98.8°F | Ht 63.0 in | Wt 136.2 lb

## 2016-04-03 DIAGNOSIS — F410 Panic disorder [episodic paroxysmal anxiety] without agoraphobia: Secondary | ICD-10-CM

## 2016-04-03 MED ORDER — ALPRAZOLAM 0.5 MG PO TABS: ORAL_TABLET | ORAL | 0 refills | Status: DC

## 2016-04-03 MED FILL — ALPRAZolam 0.5 MG TABS: 0.5 | 30 days supply | Qty: 30 | Fill #0

## 2016-04-03 NOTE — Progress Notes (Signed)
Chief Complaint  Patient presents with  . Establish Care    Subjective Susan Montoya presents for f/u anxiety/depression.  Diagnosed over 10 years ago. Has tried Paxil and counseling. Has failed Paxil.  No thoughts of harming self or others. No self-medication with alcohol, prescription drugs or illicit drugs.  ROS Psych: No homicidal or suicidal thoughts  Past Medical History:  Diagnosis Date  . Anxiety    xanax but not taking due to breast feeding   . Beta thalassemia minor   . Beta thalassemia minor   . Chronic tonsillitis 09/2013   resolved  - has tonsillectomy  . Crohn's disease ([place])    no current med.  . Decreased fetal movement during pregnancy in third trimester, antepartum 08/07/2014  . GERD (gastroesophageal reflux disease)    occasional  . HSV (herpes simplex virus) anogenital infection   . Indigestion    occasional - protonix prn  . Irregular uterine contractions 08/07/2014  . SVD (spontaneous vaginal delivery)    x 3  . TMJ syndrome    right side   Family History  Problem Relation Age of Onset  . Cancer Mother     breast  . Hyperlipidemia Mother   . Hypertension Mother   . Hypertension Father   . [place] White syndrome Father   . Heart disease Father   . Hypertension Brother   . Hypertension Maternal Grandmother   . Anemia Maternal Grandmother     beta thalasemia minor  . Hyperlipidemia Maternal Grandfather   . Hypertension Maternal Grandfather   . Cancer Maternal Grandfather     colon  . Heart disease Maternal Grandfather   . Stroke Maternal Grandfather   . Diabetes Paternal Grandmother   . Cancer Paternal Grandmother     breast  . Diabetes Paternal Grandfather      Medication List       Accurate as of 04/03/16 11:26 AM. Always use your most recent med list.          acetaminophen 325 MG tablet Commonly known as:  TYLENOL Take 650 mg by mouth every 6 (six) hours as needed for mild pain.   ALPRAZolam 0.5 MG tablet Commonly  known as:  XANAX Take 1/2 to 1 tablet daily as needed.   cetirizine 10 MG tablet Commonly known as:  ZYRTEC Take 10 mg by mouth daily.   folic acid 1 MG tablet Commonly known as:  FOLVITE Take 1 tablet (1 mg total) by mouth daily.   ibuprofen 800 MG tablet Commonly known as:  ADVIL,MOTRIN Take 1 tablet (800 mg total) by mouth every 8 (eight) hours as needed.   pantoprazole 40 MG tablet Commonly known as:  PROTONIX Take 1 tablet (40 mg total) by mouth daily as needed.   PROBIOTIC PO Take 1 tablet by mouth once a week.   VALTREX PO Take by mouth as needed.       Exam BP 121/70   Pulse 72   Temp 98.8 F (37.1 C) (Oral)   Ht 5' 3"  (1.6 m)   Wt 136 lb 3.2 oz (61.8 kg)   LMP 03/21/2016   SpO2 100%   Breastfeeding? No   BMI 24.13 kg/m  General:  well developed, well nourished, in no apparent distress Neck: neck supple without adenopathy, thyromegaly, or masses Lungs:  clear to auscultation, breath sounds equal bilaterally, no respiratory distress Cardio:  regular rate and rhythm without murmurs, heart sounds without clicks or rubs Psych: well oriented with normal range  of affect and age-appropriate judgement/insight, alert and oriented x4.  Assessment and Plan  Panic attacks - Plan: ALPRAZolam (XANAX) 0.5 MG tablet  Orders as above. I am OK with her changing to me as her main PCP. 30 pills lasts her around 6-8 mo. If she starts requiring more, I will have her back in and start a SSRI. I did discuss this with her.  F/u in 6 mo or prn. The patient voiced understanding and agreement to the plan.  [place], DO 04/03/16 11:26 AM

## 2016-04-03 NOTE — Progress Notes (Signed)
Pre visit review using our clinic tool,if applicable. No additional management support is needed unless otherwise documented below in the visit note.  

## 2016-06-06 MED FILL — PANTOPRAZOLE SOD DR 40 MG T: 40 | 30 days supply | Qty: 30 | Fill #1

## 2016-06-10 MED FILL — BENZONATATE 200 MG CAP: 200 | 13 days supply | Qty: 40 | Fill #0

## 2016-06-13 ENCOUNTER — Other Ambulatory Visit: Payer: Self-pay

## 2016-06-13 MED ORDER — PANTOPRAZOLE SODIUM 40 MG PO TBEC: 40.0000 mg | DELAYED_RELEASE_TABLET | Freq: Every day | ORAL | 3 refills | Status: DC | PRN

## 2016-06-13 NOTE — Telephone Encounter (Signed)
Received paper refill request for pantoprazole. I have refillled Rx #30 tablets with#3 refills. TL/CMA

## 2016-08-07 MED FILL — BENZONATATE 200 MG CAP: 200 | 13 days supply | Qty: 40 | Fill #1 | Status: TO

## 2016-11-13 DIAGNOSIS — Z1151 Encounter for screening for human papillomavirus (HPV): Secondary | ICD-10-CM | POA: Diagnosis not present

## 2016-11-13 DIAGNOSIS — Z01419 Encounter for gynecological examination (general) (routine) without abnormal findings: Secondary | ICD-10-CM | POA: Diagnosis not present

## 2016-11-13 DIAGNOSIS — Z6822 Body mass index (BMI) 22.0-22.9, adult: Secondary | ICD-10-CM | POA: Diagnosis not present

## 2016-11-13 DIAGNOSIS — Z1231 Encounter for screening mammogram for malignant neoplasm of breast: Secondary | ICD-10-CM | POA: Diagnosis not present

## 2016-12-03 ENCOUNTER — Telehealth: Payer: Self-pay | Admitting: Family

## 2016-12-03 NOTE — Telephone Encounter (Signed)
Pt called in to request a copy of immunizations.   Printed out what's showing on file.   Pt came in to pick up. Pt states that it should be reflecting more immunizations then what's showing. Pt was rude and tore up what was provided and stated that she was suppose to also have records transferred from an old PCP. Informed pt that Im not showing records received. Provided pt with a medical records release form assist pt with filling form out to request records. Pt then stated that she's not going to complete form and then tore up form also. Pt then left and said that due to her being an [employer] employee she will just contact employee health for records.

## 2016-12-06 ENCOUNTER — Encounter: Payer: Self-pay | Admitting: Family Medicine

## 2016-12-06 ENCOUNTER — Other Ambulatory Visit: Payer: Self-pay | Admitting: Family Medicine

## 2016-12-06 DIAGNOSIS — F410 Panic disorder [episodic paroxysmal anxiety] without agoraphobia: Secondary | ICD-10-CM

## 2016-12-06 MED ORDER — ACYCLOVIR 5 % EX CREA: TOPICAL_CREAM | CUTANEOUS | 2 refills | Status: DC

## 2016-12-06 MED FILL — VALACYCLOVIR HCL 500 MG TAB: 500 | 30 days supply | Qty: 30 | Fill #0

## 2016-12-06 MED FILL — ZOVIRAX 5% CREAM: 5 | 5 days supply | Qty: 5 | Fill #0

## 2016-12-06 NOTE — Telephone Encounter (Signed)
Last alprazolam RX: 04/03/16, #30 Last OV: 04/03/16 Next OV: none scheduled UDS: no previous UDS obtained and no Contract has been signed previously.    Please advise request?

## 2016-12-06 NOTE — Telephone Encounter (Signed)
Topical sent in, She needs follow up 6 mo appt. TY.

## 2016-12-06 NOTE — Telephone Encounter (Signed)
OK to refill. She needs her 6 mo f/u anyway. We can take care of that at that visit. Please make sure she schedules. TY.

## 2016-12-09 MED ORDER — ALPRAZOLAM 0.5 MG PO TABS: ORAL_TABLET | ORAL | 0 refills | Status: DC

## 2016-12-09 MED FILL — ALPRAZolam 0.5 MG TABS: 0.5 | 30 days supply | Qty: 30 | Fill #0

## 2016-12-09 NOTE — Telephone Encounter (Signed)
Rx approved and faxed to the pharmacy.  Confirmation received.//AB/CMA

## 2016-12-23 ENCOUNTER — Ambulatory Visit: Payer: Self-pay | Admitting: Family Medicine

## 2016-12-23 ENCOUNTER — Telehealth: Payer: 59 | Admitting: Family

## 2016-12-23 DIAGNOSIS — H109 Unspecified conjunctivitis: Secondary | ICD-10-CM | POA: Diagnosis not present

## 2016-12-23 MED ORDER — POLYMYXIN B-TRIMETHOPRIM 10000-0.1 UNIT/ML-% OP SOLN: 1.0000 [drp] | OPHTHALMIC | 0 refills | Status: DC

## 2016-12-23 MED FILL — POLYMYXIN B/TMP EYE DROPS: 10000-0.1 | 30 days supply | Qty: 10 | Fill #0

## 2016-12-23 NOTE — Progress Notes (Signed)
We are sorry that you are not feeling well.  Here is how we plan to help!  Based on what you have shared with me it looks like you have conjunctivitis.  Conjunctivitis is a common inflammatory or infectious condition of the eye that is often referred to as "pink eye".  In most cases it is contagious (viral or bacterial). However, not all conjunctivitis requires antibiotics (ex. Allergic).  We have made appropriate suggestions for you based upon your presentation.  I have prescribed Polytrim Ophthalmic drops 1-2 drops 4 times a day times 5 days. I saw you had been using antibiotic eye drops, but did not see them on your med list. Unsure if this was an older prescription or if you had been to an Urgent Care. I sent in a prescription to your pharmacy of the Polytrim drops.   Pink eye can be highly contagious.  It is typically spread through direct contact with secretions, or contaminated objects or surfaces that one may have touched.  Strict handwashing is suggested with soap and water is urged.  If not available, use alcohol based had sanitizer.  Avoid unnecessary touching of the eye.  If you wear contact lenses, you will need to refrain from wearing them until you see no white discharge from the eye for at least 24 hours after being on medication.  You should see symptom improvement in 1-2 days after starting the medication regimen.  Call us if symptoms are not improved in 1-2 days.  Home Care:  Wash your hands often!  Do not wear your contacts until you complete your treatment plan.  Avoid sharing towels, bed linen, personal items with a person who has pink eye.  See attention for anyone in your home with similar symptoms.  Get Help Right Away If:  Your symptoms do not improve.  You develop blurred or loss of vision.  Your symptoms worsen (increased discharge, pain or redness)  Your e-visit answers were reviewed by a board certified advanced clinical practitioner to complete your personal  care plan.  Depending on the condition, your plan could have included both over the counter or prescription medications.  If there is a problem please reply  once you have received a response from your provider.  Your safety is important to [place].  If you have drug allergies check your prescription carefully.    You can use MyChart to ask questions about today's visit, request a non-urgent call back, or ask for a work or school excuse for 24 hours related to this e-Visit. If it has been greater than 24 hours you will need to follow up with your provider, or enter a new e-Visit to address those concerns.   You will get an e-mail in the next two days asking about your experience.  I hope that your e-visit has been valuable and will speed your recovery. Thank you for using e-visits.

## 2016-12-23 NOTE — Addendum Note (Signed)
Addended by: Evelina Dun A on: 12/23/2016 01:17 PM   Modules accepted: Orders

## 2017-02-12 ENCOUNTER — Other Ambulatory Visit (HOSPITAL_COMMUNITY)
Admission: RE | Admit: 2017-02-12 | Discharge: 2017-02-12 | Disposition: A | Discharge status: Home / Self Care | Payer: 59 | Source: Ambulatory Visit | Attending: Family Medicine | Admitting: Family Medicine

## 2017-02-12 ENCOUNTER — Encounter: Payer: Self-pay | Admitting: Family Medicine

## 2017-02-12 ENCOUNTER — Ambulatory Visit (INDEPENDENT_AMBULATORY_CARE_PROVIDER_SITE_OTHER): Payer: 59 | Admitting: Family Medicine

## 2017-02-12 VITALS — BP 102/62 | HR 65 | Temp 98.6°F | Ht 63.0 in | Wt 125.4 lb

## 2017-02-12 DIAGNOSIS — Z113 Encounter for screening for infections with a predominantly sexual mode of transmission: Secondary | ICD-10-CM | POA: Insufficient documentation

## 2017-02-12 DIAGNOSIS — K50919 Crohn's disease, unspecified, with unspecified complications: Secondary | ICD-10-CM | POA: Diagnosis not present

## 2017-02-12 DIAGNOSIS — Z0000 Encounter for general adult medical examination without abnormal findings: Secondary | ICD-10-CM

## 2017-02-12 DIAGNOSIS — Z114 Encounter for screening for human immunodeficiency virus [HIV]: Secondary | ICD-10-CM

## 2017-02-12 DIAGNOSIS — K219 Gastro-esophageal reflux disease without esophagitis: Secondary | ICD-10-CM | POA: Insufficient documentation

## 2017-02-12 LAB — COMPREHENSIVE METABOLIC PANEL
ALBUMIN: 4.2 g/dL (ref 3.5–5.2)
ALT: 15 U/L (ref 0–35)
AST: 15 U/L (ref 0–37)
Alkaline Phosphatase: 48 U/L (ref 39–117)
BUN: 13 mg/dL (ref 6–23)
CHLORIDE: 105 meq/L (ref 96–112)
CO2: 26 mEq/L (ref 19–32)
CREATININE: 0.78 mg/dL (ref 0.40–1.20)
Calcium: 9.4 mg/dL (ref 8.4–10.5)
GFR: 86.22 mL/min (ref >60.00)
GLUCOSE: 93 mg/dL (ref 70–99)
POTASSIUM: 4 meq/L (ref 3.5–5.1)
SODIUM: 138 meq/L (ref 135–145)
TOTAL PROTEIN: 7.5 g/dL (ref 6.0–8.3)
Total Bilirubin: 0.8 mg/dL (ref 0.2–1.2)

## 2017-02-12 LAB — CBC
HEMATOCRIT: 33.7 % — AB (ref 36.0–46.0)
Hemoglobin: 10.3 g/dL — ABNORMAL LOW (ref 12.0–15.0)
MCHC: 30.6 g/dL (ref 30.0–36.0)
Platelets: 309 10*3/uL (ref 150.0–400.0)
RBC: 5.07 Mil/uL (ref 3.87–5.11)
RDW: 15.8 % — AB (ref 11.5–15.5)
WBC: 8.2 10*3/uL (ref 4.0–10.5)

## 2017-02-12 LAB — LIPID PANEL
CHOL/HDL RATIO: 2
Cholesterol: 136 mg/dL (ref 0–200)
HDL: 81.4 mg/dL (ref >39.00)
LDL Cholesterol: 38 mg/dL (ref 0–99)
NONHDL: 54.85
Triglycerides: 82 mg/dL (ref 0.0–149.0)
VLDL: 16.4 mg/dL (ref 0.0–40.0)

## 2017-02-12 LAB — HEMOGLOBIN A1C: HEMOGLOBIN A1C: 5.3 % (ref 4.6–6.5)

## 2017-02-12 LAB — HIV ANTIBODY (ROUTINE TESTING W REFLEX): HIV: NONREACTIVE

## 2017-02-12 MED ORDER — MOMETASONE FUROATE 50 MCG/ACT NA SUSP: 2.0000 | Freq: Every day | NASAL | 5 refills | Status: DC

## 2017-02-12 NOTE — Progress Notes (Signed)
Chief Complaint  Patient presents with  . Follow-up    STD check     Well Woman Susan Montoya is here for a complete physical.   Her last physical was >1 year ago.  Current diet: in general, a "healthy" diet  . Current exercise: jogging.  Weight is stable and she denies daytime fatigue. No LMP recorded..  Follow with a Dentist? Yes.   Follow with an Optometrist? Yes.   Seatbelt? Yes   Health Maintenance Pap/HPV- Yes Mammogram- Yes 11/13/2016 HIV- Yes; sexually active, recently broke up with BF and would like to be rechecked; STI's also  Past Medical History:  Diagnosis Date  . Anxiety    xanax but not taking due to breast feeding   . Beta thalassemia minor   . Beta thalassemia minor   . Chronic tonsillitis 09/2013   resolved  - has tonsillectomy  . Crohn's disease ([place])    no current med.  . Decreased fetal movement during pregnancy in third trimester, antepartum 08/07/2014  . GERD (gastroesophageal reflux disease)    occasional  . HSV (herpes simplex virus) anogenital infection   . Indigestion    occasional - protonix prn  . Irregular uterine contractions 08/07/2014  . SVD (spontaneous vaginal delivery)    x 3  . TMJ syndrome    right side    Past Surgical History:  Procedure Laterality Date  . APPENDECTOMY    . COLON RESECTION  age 34   removal of 1 ft. large and small intestine  . LAPAROSCOPIC TUBAL LIGATION Bilateral 11/07/2014   Procedure: LAPAROSCOPIC  BILATERAL TUBAL LIGATION with cautery and fulgeration of endmetrios;  Surgeon: Servando Salina, MD;  Location: [facility];  Service: Gynecology;  Laterality: Bilateral;  . TONSILLECTOMY N/A 10/08/2013   Procedure: TONSILLECTOMY;  Surgeon: Rozetta Nunnery, MD;  Location: [facility];  Service: ENT;  Laterality: N/A;  . WISDOM TOOTH EXTRACTION     Medications  Current Outpatient Prescriptions on File Prior to Visit  Medication Sig Dispense Refill  . acetaminophen (TYLENOL) 325 MG tablet Take 650  mg by mouth every 6 (six) hours as needed for mild pain.    Marland Kitchen acyclovir cream (ZOVIRAX) 5 % Apply 5 times daily for 4 days during flares. 5 g 2  . ALPRAZolam (XANAX) 0.5 MG tablet Take 1/2 to 1 tablet daily as needed. 30 tablet 0  . cetirizine (ZYRTEC) 10 MG tablet Take 10 mg by mouth daily.    . folic acid (FOLVITE) 1 MG tablet Take 1 tablet (1 mg total) by mouth daily.    Marland Kitchen ibuprofen (ADVIL,MOTRIN) 800 MG tablet Take 1 tablet (800 mg total) by mouth every 8 (eight) hours as needed. 30 tablet 0  . pantoprazole (PROTONIX) 40 MG tablet Take 1 tablet (40 mg total) by mouth daily as needed. 30 tablet 3  . Probiotic Product (PROBIOTIC PO) Take 1 tablet by mouth once a week.     . trimethoprim-polymyxin b (POLYTRIM) ophthalmic solution Place 1 drop into the right eye every 4 (four) hours. 10 mL 0  . ValACYclovir HCl (VALTREX PO) Take by mouth as needed.      Allergies No Known Allergies  Review of Systems: Constitutional:  no unexpected change in weight, no weakness, no unexplained fevers, sweats, or chills Eye:  no recent significant change in vision Ear/Nose/Mouth/Throat:  Ears:  no tinnitus or vertigo and no recent change in hearing, Nose/Mouth/Throat:  no complaints of nasal congestion or discharge, no sore throat  and no recent change in voice or hoarseness Cardiovascular:  no exercise intolerance, no chest pain, no palpitations Respiratory:  no chronic cough, sputum, or hemoptysis and no shortness of breath Gastrointestinal:  no abdominal pain, no change in bowel habits, no significant change in appetite, no nausea, vomiting, diarrhea, or constipation and no black or bloody stool GU:  Female: negative for dysuria, frequency, and incontinence, Normal menses; no abnormal bleeding, pelvic pain, or discharge Musculoskeletal/Extremities:  no new pain, redness, or swelling of the joints Integumentary (Skin/Breast):  no abnormal skin lesions reported, no new breast lumps or masses Neurologic:  no  chronic headaches, no numbness, tingling, or tremor Psychiatric:  no anxiety, no depression Endocrine:  denies fatigue, unexplained weight changes Hematologic/Lymphatic:  no abnormal bleeding  Exam BP 102/62 (BP Location: Left Arm, Patient Position: Sitting, Cuff Size: Normal)   Pulse 65   Temp 98.6 F (37 C) (Oral)   Ht 5' 3"  (1.6 m)   Wt 125 lb 6 oz (56.9 kg)   SpO2 97%   BMI 22.21 kg/m  General:  well developed, well nourished, in no apparent distress Skin:  no significant moles, warts, or growths Head:  no masses, lesions, or tenderness Eyes:  pupils equal and round, sclera anicteric without injection Ears:  canals without lesions, TMs shiny without retraction, no obvious effusion, no erythema Nose:  nares patent, septum midline, mucosa normal, and no drainage or sinus tenderness Throat/Pharynx:  lips and gingiva without lesion; tongue and uvula midline; non-inflamed pharynx; no exudates or postnasal drainage Neck: neck supple without adenopathy, thyromegaly, or masses Breasts: defer to GYN Thorax:  nontender Lungs:  clear to auscultation, breath sounds equal bilaterally, no respiratory distress Cardio:  regular rate and rhythm without murmurs, heart sounds without clicks or rubs, point of maximal impulse normal; no lifts, heaves, or thrills Abdomen:  abdomen soft, nontender; bowel sounds normal; no masses or organomegaly Genital: Defer to GYN Musculoskeletal:  symmetrical muscle groups noted without atrophy or deformity Extremities:  no clubbing, cyanosis, or edema, no deformities, no skin discoloration Neuro:  gait normal; deep tendon reflexes normal and symmetric Psych: well oriented with normal range of affect and appropriate judgment/insight  Assessment and Plan  Well adult exam - Plan: CBC, Comprehensive metabolic panel, Lipid panel, Hemoglobin A1c  Screening for HIV (human immunodeficiency virus) - Plan: HIV antibody  Crohn's disease with complication, unspecified  gastrointestinal tract location [facility]) - Plan: Ambulatory referral to Gastroenterology  Routine screening for STI (sexually transmitted infection) - Plan: Urine cytology ancillary only   Well 41 y.o. female. Counseled on diet and exercise. Other orders as above. Refer to GI, Dr. Collene Mares, for new provider for Crohn's. Next colonoscopy in 2022 per patient.  Follow up 1 yr or prn.  The patient voiced understanding and agreement to the plan.  [place], DO 02/12/17 12:28 PM

## 2017-02-12 NOTE — Patient Instructions (Addendum)
Give [place] 2-3 business days to get the results of your labs back. If labs are normal, you will likely receive a letter in the mail unless you have MyChart. This can take longer than 2-3 business days.   If you do not hear anything about your referral in the next 1-2 weeks, call our office and ask for an update.  Let us know if you need anything.

## 2017-02-12 NOTE — Progress Notes (Signed)
Pre visit review using our clinic review tool, if applicable. No additional management support is needed unless otherwise documented below in the visit note. 

## 2017-02-13 LAB — URINE CYTOLOGY ANCILLARY ONLY
CHLAMYDIA, DNA PROBE: NEGATIVE
NEISSERIA GONORRHEA: NEGATIVE
Trichomonas: NEGATIVE

## 2017-02-20 MED FILL — MOMETASONE FUROATE 50 MCG S: 50 | 30 days supply | Qty: 17 | Fill #0

## 2017-03-13 DIAGNOSIS — K625 Hemorrhage of anus and rectum: Secondary | ICD-10-CM | POA: Diagnosis not present

## 2017-03-13 DIAGNOSIS — Z1211 Encounter for screening for malignant neoplasm of colon: Secondary | ICD-10-CM | POA: Diagnosis not present

## 2017-03-13 DIAGNOSIS — K5080 Crohn's disease of both small and large intestine without complications: Secondary | ICD-10-CM | POA: Diagnosis not present

## 2017-03-13 DIAGNOSIS — D563 Thalassemia minor: Secondary | ICD-10-CM | POA: Diagnosis not present

## 2017-04-15 MED FILL — AMOX-CLAV 875-125 MG TABLET: 875-125 | 10 days supply | Qty: 20 | Fill #0

## 2017-05-28 ENCOUNTER — Other Ambulatory Visit: Payer: Self-pay | Admitting: Family Medicine

## 2017-05-28 MED ORDER — FOLIC ACID 1 MG PO TABS: 1.0000 mg | ORAL_TABLET | Freq: Every day | ORAL | 2 refills | Status: DC

## 2017-06-11 ENCOUNTER — Encounter: Payer: Self-pay | Admitting: Family Medicine

## 2017-06-12 ENCOUNTER — Other Ambulatory Visit: Payer: Self-pay | Admitting: Family Medicine

## 2017-06-12 ENCOUNTER — Encounter: Payer: Self-pay | Admitting: Family Medicine

## 2017-06-12 MED ORDER — ESCITALOPRAM OXALATE 10 MG PO TABS: 10.0000 mg | ORAL_TABLET | Freq: Every day | ORAL | 5 refills | Status: DC

## 2017-06-12 MED FILL — ESCITALOPRAM 10 MG TABLET: 10 | 30 days supply | Qty: 30 | Fill #0

## 2017-07-07 ENCOUNTER — Encounter: Payer: Self-pay | Admitting: Family Medicine

## 2017-07-07 ENCOUNTER — Ambulatory Visit (INDEPENDENT_AMBULATORY_CARE_PROVIDER_SITE_OTHER): Payer: 59 | Admitting: Family Medicine

## 2017-07-07 VITALS — BP 110/68 | HR 75 | Temp 98.0°F | Ht 63.0 in | Wt 128.5 lb

## 2017-07-07 DIAGNOSIS — F411 Generalized anxiety disorder: Secondary | ICD-10-CM

## 2017-07-07 MED ORDER — VENLAFAXINE HCL ER 37.5 MG PO CP24: 37.5000 mg | ORAL_CAPSULE | Freq: Every day | ORAL | 2 refills | Status: DC

## 2017-07-07 MED FILL — VENLAFAXINE HCL ER 37.5 MG: 37.5 | 30 days supply | Qty: 30 | Fill #0

## 2017-07-07 NOTE — Progress Notes (Signed)
Pre visit review using our clinic review tool, if applicable. No additional management support is needed unless otherwise documented below in the visit note. 

## 2017-07-07 NOTE — Progress Notes (Signed)
Chief Complaint  Patient presents with  . Follow-up    medications    Subjective Susan Montoya presents for f/u anxiety/depression.  She is currently being treated with Lexapro 5 mg/d.  Reports side effects since treatment with nausea. She has failed Paxil in past, mom has hx of depression and has been on Effexor successfully. She takes Xanax around 2x/week. 30 tabs filled in 11/2016.  She has not been able to run as much lately 2/2 weather.  She is a single mother of 3 children and recently kicked out the father.  This is causing her to have issues. No thoughts of harming self or others. No self-medication with alcohol, prescription drugs or illicit drugs. Pt is not following with a counselor/psychologist.  ROS Psych: No homicidal or suicidal thoughts  Past Medical History:  Diagnosis Date  . Anxiety    xanax but not taking due to breast feeding   . Beta thalassemia minor   . Beta thalassemia minor   . Chronic tonsillitis 09/2013   resolved  - has tonsillectomy  . Crohn's disease ([place])    no current med.  . Decreased fetal movement during pregnancy in third trimester, antepartum 08/07/2014  . GERD (gastroesophageal reflux disease)    occasional  . HSV (herpes simplex virus) anogenital infection   . Indigestion    occasional - protonix prn  . Irregular uterine contractions 08/07/2014  . SVD (spontaneous vaginal delivery)    x 3  . TMJ syndrome    right side   Exam BP 110/68 (BP Location: Left Arm, Patient Position: Sitting, Cuff Size: Normal)   Pulse 75   Temp 98 F (36.7 C) (Oral)   Ht 5' 3"  (1.6 m)   Wt 128 lb 8 oz (58.3 kg)   SpO2 98%   BMI 22.76 kg/m  General:  well developed, well nourished, in no apparent distress Lungs:  clear to auscultation, breath sounds equal bilaterally, no respiratory distress Cardio:  regular rate and rhythm without murmurs, heart sounds without clicks or rubs Psych: well oriented with normal range of affect and age-appropriate  judgement/insight, alert and oriented x4.  Assessment and Plan  Anxiety state - Plan: venlafaxine XR (EFFEXOR-XR) 37.5 MG 24 hr capsule  Stop Lexapro, start Effexor.  Number for counseling given. Coping strategies given.  F/u in 6 weeks. The patient voiced understanding and agreement to the plan.  [place], DO 07/07/17 12:06 PM

## 2017-07-07 NOTE — Patient Instructions (Addendum)
Please call [phone] to schedule an appointment with counseling and determining insurance coverage.  Coping skills Choose 5 that work for you:  Take a deep breath  Count to 20  Read a book  Do a puzzle  Meditate  Bake  Sing  Knit  Garden  Pray  Go outside  Call a friend  Listen to music  Take a walk  Color  Send a note  Take a bath  Watch a movie  Be alone in a quiet place  Pet an animal  Visit a friend  Journal  Exercise  Stretch   Let us know if you need anything.

## 2017-07-09 ENCOUNTER — Ambulatory Visit: Payer: Self-pay | Admitting: Family Medicine

## 2017-07-31 MED FILL — VENLAFAXINE HCL ER 37.5 MG: 37.5 | 30 days supply | Qty: 30 | Fill #1

## 2017-08-02 ENCOUNTER — Encounter: Payer: Self-pay | Admitting: Family Medicine

## 2017-08-04 ENCOUNTER — Other Ambulatory Visit: Payer: Self-pay | Admitting: Family Medicine

## 2017-08-04 MED ORDER — VENLAFAXINE HCL ER 75 MG PO CP24: 75.0000 mg | ORAL_CAPSULE | Freq: Every day | ORAL | 2 refills | Status: DC

## 2017-08-26 MED FILL — VENLAFAXINE HCL ER 37.5 MG: 37.5 | 30 days supply | Qty: 30 | Fill #2

## 2017-09-25 ENCOUNTER — Other Ambulatory Visit: Payer: Self-pay | Admitting: Family Medicine

## 2017-09-25 ENCOUNTER — Encounter: Payer: Self-pay | Admitting: Family Medicine

## 2017-09-25 DIAGNOSIS — F411 Generalized anxiety disorder: Secondary | ICD-10-CM

## 2017-09-25 DIAGNOSIS — F410 Panic disorder [episodic paroxysmal anxiety] without agoraphobia: Secondary | ICD-10-CM

## 2017-09-25 MED FILL — VENLAFAXINE HCL ER 37.5 MG: 37.5 | 30 days supply | Qty: 30 | Fill #0

## 2017-09-26 ENCOUNTER — Other Ambulatory Visit: Payer: Self-pay | Admitting: Family Medicine

## 2017-09-26 DIAGNOSIS — F411 Generalized anxiety disorder: Secondary | ICD-10-CM

## 2017-09-26 MED ORDER — ALPRAZOLAM 0.5 MG PO TABS: ORAL_TABLET | ORAL | 2 refills | Status: DC

## 2017-09-26 MED ORDER — VENLAFAXINE HCL ER 37.5 MG PO CP24: 37.5000 mg | ORAL_CAPSULE | Freq: Every day | ORAL | 5 refills | Status: DC

## 2017-09-26 MED FILL — ALPRAZolam 0.5 MG TABS: 0.5 | 30 days supply | Qty: 30 | Fill #0

## 2017-09-26 NOTE — Telephone Encounter (Signed)
Requesting:Alprazolam Contract:none FCZ:[id number] Last Visit:07/07/17 Next Visit:none Last Refill:12/09/16   Please Advise

## 2017-10-08 MED FILL — GAVILYTE-G SOLUTION: 236 | 1 days supply | Qty: 4000 | Fill #0

## 2017-10-20 DIAGNOSIS — K633 Ulcer of intestine: Secondary | ICD-10-CM | POA: Diagnosis not present

## 2017-10-20 DIAGNOSIS — K5090 Crohn's disease, unspecified, without complications: Secondary | ICD-10-CM | POA: Diagnosis not present

## 2017-10-20 DIAGNOSIS — Z1211 Encounter for screening for malignant neoplasm of colon: Secondary | ICD-10-CM | POA: Diagnosis not present

## 2017-10-20 DIAGNOSIS — K5000 Crohn's disease of small intestine without complications: Secondary | ICD-10-CM | POA: Diagnosis not present

## 2017-10-21 MED FILL — VENLAFAXINE HCL ER 37.5 MG: 37.5 | 30 days supply | Qty: 30 | Fill #1

## 2017-11-21 MED FILL — VENLAFAXINE HCL ER 37.5 MG: 37.5 | 30 days supply | Qty: 30 | Fill #2

## 2017-11-27 ENCOUNTER — Encounter: Payer: Self-pay | Admitting: Family Medicine

## 2017-11-27 DIAGNOSIS — F411 Generalized anxiety disorder: Secondary | ICD-10-CM

## 2017-11-28 MED ORDER — VENLAFAXINE HCL ER 37.5 MG PO CP24: 37.5000 mg | ORAL_CAPSULE | Freq: Every day | ORAL | 5 refills | Status: DC

## 2017-12-04 ENCOUNTER — Ambulatory Visit: Payer: 59 | Admitting: Family Medicine

## 2017-12-09 DIAGNOSIS — Z1151 Encounter for screening for human papillomavirus (HPV): Secondary | ICD-10-CM | POA: Diagnosis not present

## 2017-12-09 DIAGNOSIS — Z114 Encounter for screening for human immunodeficiency virus [HIV]: Secondary | ICD-10-CM | POA: Diagnosis not present

## 2017-12-09 DIAGNOSIS — Z113 Encounter for screening for infections with a predominantly sexual mode of transmission: Secondary | ICD-10-CM | POA: Diagnosis not present

## 2017-12-09 DIAGNOSIS — Z6822 Body mass index (BMI) 22.0-22.9, adult: Secondary | ICD-10-CM | POA: Diagnosis not present

## 2017-12-09 DIAGNOSIS — Z01419 Encounter for gynecological examination (general) (routine) without abnormal findings: Secondary | ICD-10-CM | POA: Diagnosis not present

## 2017-12-09 DIAGNOSIS — Z1231 Encounter for screening mammogram for malignant neoplasm of breast: Secondary | ICD-10-CM | POA: Diagnosis not present

## 2017-12-09 DIAGNOSIS — Z1159 Encounter for screening for other viral diseases: Secondary | ICD-10-CM | POA: Diagnosis not present

## 2017-12-22 MED FILL — VENLAFAXINE HCL ER 37.5 MG: 37.5 | 30 days supply | Qty: 30 | Fill #0

## 2018-01-05 ENCOUNTER — Encounter: Payer: Self-pay | Admitting: Family Medicine

## 2018-01-07 ENCOUNTER — Other Ambulatory Visit: Payer: Self-pay | Admitting: Family Medicine

## 2018-01-07 MED ORDER — VALACYCLOVIR HCL 500 MG PO TABS: 500.0000 mg | ORAL_TABLET | Freq: Every day | ORAL | 3 refills | Status: DC

## 2018-01-07 MED FILL — VALACYCLOVIR HCL 500 MG TAB: 500 | 30 days supply | Qty: 30 | Fill #0

## 2018-01-09 MED FILL — ALPRAZolam 0.5 MG TABS: 0.5 | 30 days supply | Qty: 30 | Fill #1

## 2018-01-12 ENCOUNTER — Encounter: Payer: Self-pay | Admitting: Family Medicine

## 2018-02-20 ENCOUNTER — Encounter: Payer: Self-pay | Admitting: Family Medicine

## 2018-02-27 ENCOUNTER — Ambulatory Visit: Payer: 59 | Admitting: Family Medicine

## 2018-03-01 ENCOUNTER — Ambulatory Visit: Payer: Self-pay | Admitting: Nurse Practitioner

## 2018-03-01 VITALS — BP 95/70 | HR 73 | Temp 98.5°F | Resp 16 | Wt 131.8 lb

## 2018-03-01 DIAGNOSIS — R059 Cough, unspecified: Secondary | ICD-10-CM

## 2018-03-01 DIAGNOSIS — R05 Cough: Secondary | ICD-10-CM

## 2018-03-01 DIAGNOSIS — J019 Acute sinusitis, unspecified: Secondary | ICD-10-CM

## 2018-03-01 MED ORDER — BENZONATATE 200 MG PO CAPS: 200.0000 mg | ORAL_CAPSULE | Freq: Three times a day (TID) | ORAL | 0 refills | Status: AC | PRN

## 2018-03-01 MED ORDER — FLUTICASONE PROPIONATE 50 MCG/ACT NA SUSP: 2.0000 | Freq: Every day | NASAL | 0 refills | Status: DC

## 2018-03-01 MED ORDER — AMOXICILLIN-POT CLAVULANATE 875-125 MG PO TABS: 1.0000 | ORAL_TABLET | Freq: Two times a day (BID) | ORAL | 0 refills | Status: AC

## 2018-03-01 NOTE — Patient Instructions (Addendum)
Sinusitis, Adult -Take medications as prescribed. -Ibuprofen or Tylenol for pain, fever, or general discomfort. -May continue use of Mucinex to help with chest congestion. -Increase fluids. -May use a humidifier or vaporizer when at home and during bedtime to help with cough. -Sleep elevated on at least 2 pillows at bedtime. -Use of normal saline nasal spray to help with nasal congestion throughout the day. -May use a teaspoon of honey as needed for cough. -Follow-up with PCP if symptoms do not improve.   Sinusitis is soreness and inflammation of your sinuses. Sinuses are hollow spaces in the bones around your face. Your sinuses are located:  Around your eyes.  In the middle of your forehead.  Behind your nose.  In your cheekbones.  Your sinuses and nasal passages are lined with a stringy fluid (mucus). Mucus normally drains out of your sinuses. When your nasal tissues become inflamed or swollen, the mucus can become trapped or blocked so air cannot flow through your sinuses. This allows bacteria, viruses, and funguses to grow, which leads to infection. Sinusitis can develop quickly and last for 7?10 days (acute) or for more than 12 weeks (chronic). Sinusitis often develops after a cold. What are the causes? This condition is caused by anything that creates swelling in the sinuses or stops mucus from draining, including:  Allergies.  Asthma.  Bacterial or viral infection.  Abnormally shaped bones between the nasal passages.  Nasal growths that contain mucus (nasal polyps).  Narrow sinus openings.  Pollutants, such as chemicals or irritants in the air.  A foreign object stuck in the nose.  A fungal infection. This is rare.  What increases the risk? The following factors may make you more likely to develop this condition:  Having allergies or asthma.  Having had a recent cold or respiratory tract infection.  Having structural deformities or blockages in your nose or  sinuses.  Having a weak immune system.  Doing a lot of swimming or diving.  Overusing nasal sprays.  Smoking.  What are the signs or symptoms? The main symptoms of this condition are pain and a feeling of pressure around the affected sinuses. Other symptoms include:  Upper toothache.  Earache.  Headache.  Bad breath.  Decreased sense of smell and taste.  A cough that may get worse at night.  Fatigue.  Fever.  Thick drainage from your nose. The drainage is often green and it may contain pus (purulent).  Stuffy nose or congestion.  Postnasal drip. This is when extra mucus collects in the throat or back of the nose.  Swelling and warmth over the affected sinuses.  Sore throat.  Sensitivity to light.  How is this diagnosed? This condition is diagnosed based on symptoms, a medical history, and a physical exam. To find out if your condition is acute or chronic, your health care provider may:  Look in your nose for signs of nasal polyps.  Tap over the affected sinus to check for signs of infection.  View the inside of your sinuses using an imaging device that has a light attached (endoscope).  If your health care provider suspects that you have chronic sinusitis, you may also:  Be tested for allergies.  Have a sample of mucus taken from your nose (nasal culture) and checked for bacteria.  Have a mucus sample examined to see if your sinusitis is related to an allergy.  If your sinusitis does not respond to treatment and it lasts longer than 8 weeks, you may have an  MRI or CT scan to check your sinuses. These scans also help to determine how severe your infection is. In rare cases, a bone biopsy may be done to rule out more serious types of fungal sinus disease. How is this treated? Treatment for sinusitis depends on the cause and whether your condition is chronic or acute. If a virus is causing your sinusitis, your symptoms will go away on their own within 10  days. You may be given medicines to relieve your symptoms, including:  Topical nasal decongestants. They shrink swollen nasal passages and let mucus drain from your sinuses.  Antihistamines. These drugs block inflammation that is triggered by allergies. This can help to ease swelling in your nose and sinuses.  Topical nasal corticosteroids. These are nasal sprays that ease inflammation and swelling in your nose and sinuses.  Nasal saline washes. These rinses can help to get rid of thick mucus in your nose.  If your condition is caused by bacteria, you will be given an antibiotic medicine. If your condition is caused by a fungus, you will be given an antifungal medicine. Surgery may be needed to correct underlying conditions, such as narrow nasal passages. Surgery may also be needed to remove polyps. Follow these instructions at home: Medicines  Take, use, or apply over-the-counter and prescription medicines only as told by your health care provider. These may include nasal sprays.  If you were prescribed an antibiotic medicine, take it as told by your health care provider. Do not stop taking the antibiotic even if you start to feel better. Hydrate and Humidify  Drink enough water to keep your urine clear or pale yellow. Staying hydrated will help to thin your mucus.  Use a cool mist humidifier to keep the humidity level in your home above 50%.  Inhale steam for 10-15 minutes, 3-4 times a day or as told by your health care provider. You can do this in the bathroom while a hot shower is running.  Limit your exposure to cool or dry air. Rest  Rest as much as possible.  Sleep with your head raised (elevated).  Make sure to get enough sleep each night. General instructions  Apply a warm, moist washcloth to your face 3-4 times a day or as told by your health care provider. This will help with discomfort.  Wash your hands often with soap and water to reduce your exposure to viruses and  other germs. If soap and water are not available, use hand sanitizer.  Do not smoke. Avoid being around people who are smoking (secondhand smoke).  Keep all follow-up visits as told by your health care provider. This is important. Contact a health care provider if:  You have a fever.  Your symptoms get worse.  Your symptoms do not improve within 10 days. Get help right away if:  You have a severe headache.  You have persistent vomiting.  You have pain or swelling around your face or eyes.  You have vision problems.  You develop confusion.  Your neck is stiff.  You have trouble breathing. This information is not intended to replace advice given to you by your health care provider. Make sure you discuss any questions you have with your health care provider. Document Released: 04/15/2005 Document Revised: 12/10/2015 Document Reviewed: 02/08/2015 Elsevier Interactive Patient Education  2018 [place].  Cough, Adult Coughing is a reflex that clears your throat and your airways. Coughing helps to heal and protect your lungs. It is normal to cough occasionally,  but a cough that happens with other symptoms or lasts a long time may be a sign of a condition that needs treatment. A cough may last only 2-3 weeks (acute), or it may last longer than 8 weeks (chronic). What are the causes? Coughing is commonly caused by:  Breathing in substances that irritate your lungs.  A viral or bacterial respiratory infection.  Allergies.  Asthma.  Postnasal drip.  Smoking.  Acid backing up from the stomach into the esophagus (gastroesophageal reflux).  Certain medicines.  Chronic lung problems, including COPD (or rarely, lung cancer).  Other medical conditions such as heart failure.  Follow these instructions at home: Pay attention to any changes in your symptoms. Take these actions to help with your discomfort:  Take medicines only as told by your health care provider. ? If you  were prescribed an antibiotic medicine, take it as told by your health care provider. Do not stop taking the antibiotic even if you start to feel better. ? Talk with your health care provider before you take a cough suppressant medicine.  Drink enough fluid to keep your urine clear or pale yellow.  If the air is dry, use a cold steam vaporizer or humidifier in your bedroom or your home to help loosen secretions.  Avoid anything that causes you to cough at work or at home.  If your cough is worse at night, try sleeping in a semi-upright position.  Avoid cigarette smoke. If you smoke, quit smoking. If you need help quitting, ask your health care provider.  Avoid caffeine.  Avoid alcohol.  Rest as needed.  Contact a health care provider if:  You have new symptoms.  You cough up pus.  Your cough does not get better after 2-3 weeks, or your cough gets worse.  You cannot control your cough with suppressant medicines and you are losing sleep.  You develop pain that is getting worse or pain that is not controlled with pain medicines.  You have a fever.  You have unexplained weight loss.  You have night sweats. Get help right away if:  You cough up blood.  You have difficulty breathing.  Your heartbeat is very fast. This information is not intended to replace advice given to you by your health care provider. Make sure you discuss any questions you have with your health care provider. Document Released: 10/12/2010 Document Revised: 09/21/2015 Document Reviewed: 06/22/2014 Elsevier Interactive Patient Education  [employer].

## 2018-03-01 NOTE — Progress Notes (Signed)
Subjective:  Susan Montoya is a 42 y.o. female who presents for evaluation of possible sinusitis.  Symptoms include right ear pressure/pain, cough described as productive of green sputum, nasal congestion, sinus pressure, sore throat and dull headache.  Onset of symptoms was 4 days ago, and has been rapidly worsening since that time.  Treatment to date:  Zyrtec, Benadryl and Mucinex.  High risk factors for influenza complications:  none.  The following portions of the patient's history were reviewed and updated as appropriate:  allergies, current medications and past medical history.  Constitutional: positive for fatigue and fevers, negative for anorexia, chills and malaise Eyes: negative Ears, nose, mouth, throat, and face: positive for nasal congestion, sore throat and bilateral ear pressure, postnasal drip, negative for ear drainage, earaches and hoarseness Respiratory: positive for cough and sputum, negative for asthma, chronic bronchitis, pleurisy/chest pain, pneumonia, stridor and wheezing Cardiovascular: negative Gastrointestinal: negative Neurological: positive for headaches, negative for coordination problems, dizziness, gait problems, paresthesia, seizures, tremors, vertigo and weakness Allergic/Immunologic: positive for hay fever Objective:  BP 95/70 (BP Location: Right Arm, Patient Position: Sitting, Cuff Size: Normal)   Pulse 73   Temp 98.5 F (36.9 C) (Oral)   Resp 16   Wt 131 lb 12.8 oz (59.8 kg)   SpO2 99%   BMI 23.35 kg/m  General appearance: alert, cooperative, fatigued and no distress Head: Normocephalic, without obvious abnormality, atraumatic Eyes: conjunctivae/corneas clear. PERRL, EOM's intact. Fundi benign. Ears: normal TM and external ear canal left ear and abnormal TM right ear - mucoid middle ear fluid Nose: no discharge, mild congestion, turbinates swollen, inflamed, mild maxillary sinus tenderness bilateral, no frontal sinus tenderness bilateral Throat: lips,  mucosa, and tongue normal; teeth and gums normal Lungs: clear to auscultation bilaterally Heart: regular rate and rhythm, S1, S2 normal, no murmur, click, rub or gallop Abdomen: soft, non-tender; bowel sounds normal; no masses,  no organomegaly Pulses: 2+ and symmetric Skin: Skin color, texture, turgor normal. No rashes or lesions Lymph nodes: cervical and submandibular nodes normal Neurologic: Grossly normal    Assessment:  Acute Sinusitis   Plan:   Exam findings, diagnosis etiology and medication use and indications reviewed with patient. Follow- Up and discharge instructions provided. No emergent/urgent issues found on exam. Patient education was provided. Patient verbalized understanding of information provided and agrees with plan of care (POC), all questions answered. The patient is advised to call or return to clinic if condition does not see an improvement in symptoms, or to seek the care of the closest emergency department if condition worsens with the above plan.   1. Acute sinusitis, recurrence not specified, unspecified location  - amoxicillin-clavulanate (AUGMENTIN) 875-125 MG tablet; Take 1 tablet by mouth 2 (two) times daily for 10 days.  Dispense: 20 tablet; Refill: 0 - fluticasone (FLONASE) 50 MCG/ACT nasal spray; Place 2 sprays into both nostrils daily for 10 days.  Dispense: 16 g; Refill: 0 -Take medications as prescribed. -Ibuprofen or Tylenol for pain, fever, or general discomfort. -May continue use of Mucinex to help with chest congestion. -Increase fluids. --Use of normal saline nasal spray to help with nasal congestion throughout the day. -Follow-up with PCP if symptoms do not improve.  2. Cough -- benzonatate (TESSALON) 200 MG capsule; Take 1 capsule (200 mg total) by mouth 3 (three) times daily as needed for up to 10 days for cough.  Dispense: 30 capsule; Refill: 0 May use a humidifier or vaporizer when at home and during bedtime to help with cough. -  Sleep  elevated on at least 2 pillows at bedtime. -May use a teaspoon of honey as needed for cough. -Follow-up with PCP if symptoms do not improve.

## 2018-04-24 DIAGNOSIS — F411 Generalized anxiety disorder: Secondary | ICD-10-CM | POA: Diagnosis not present

## 2018-04-27 ENCOUNTER — Encounter: Payer: Self-pay | Admitting: Family Medicine

## 2018-05-04 ENCOUNTER — Ambulatory Visit: Payer: 59 | Admitting: Family Medicine

## 2018-05-20 ENCOUNTER — Other Ambulatory Visit: Payer: Self-pay | Admitting: Family Medicine

## 2018-05-20 ENCOUNTER — Encounter: Payer: Self-pay | Admitting: Family Medicine

## 2018-05-20 ENCOUNTER — Other Ambulatory Visit (HOSPITAL_COMMUNITY)
Admission: RE | Admit: 2018-05-20 | Discharge: 2018-05-20 | Disposition: A | Discharge status: Home / Self Care | Payer: No Typology Code available for payment source | Source: Ambulatory Visit | Attending: Family Medicine | Admitting: Family Medicine

## 2018-05-20 ENCOUNTER — Ambulatory Visit (INDEPENDENT_AMBULATORY_CARE_PROVIDER_SITE_OTHER): Payer: No Typology Code available for payment source | Admitting: Family Medicine

## 2018-05-20 VITALS — BP 108/68 | HR 65 | Temp 97.6°F | Ht 63.0 in | Wt 135.0 lb

## 2018-05-20 DIAGNOSIS — K50919 Crohn's disease, unspecified, with unspecified complications: Secondary | ICD-10-CM

## 2018-05-20 DIAGNOSIS — Z0000 Encounter for general adult medical examination without abnormal findings: Secondary | ICD-10-CM | POA: Diagnosis not present

## 2018-05-20 DIAGNOSIS — Z114 Encounter for screening for human immunodeficiency virus [HIV]: Secondary | ICD-10-CM | POA: Diagnosis not present

## 2018-05-20 DIAGNOSIS — Z113 Encounter for screening for infections with a predominantly sexual mode of transmission: Secondary | ICD-10-CM | POA: Diagnosis not present

## 2018-05-20 DIAGNOSIS — Z1283 Encounter for screening for malignant neoplasm of skin: Secondary | ICD-10-CM

## 2018-05-20 DIAGNOSIS — R519 Headache, unspecified: Secondary | ICD-10-CM

## 2018-05-20 DIAGNOSIS — F411 Generalized anxiety disorder: Secondary | ICD-10-CM

## 2018-05-20 DIAGNOSIS — R51 Headache: Secondary | ICD-10-CM

## 2018-05-20 DIAGNOSIS — K5010 Crohn's disease of large intestine without complications: Secondary | ICD-10-CM

## 2018-05-20 DIAGNOSIS — N939 Abnormal uterine and vaginal bleeding, unspecified: Secondary | ICD-10-CM

## 2018-05-20 DIAGNOSIS — D649 Anemia, unspecified: Secondary | ICD-10-CM

## 2018-05-20 LAB — LIPID PANEL
CHOL/HDL RATIO: 2
CHOLESTEROL: 144 mg/dL (ref 0–200)
HDL: 81.5 mg/dL (ref >39.00)
LDL CALC: 47 mg/dL (ref 0–99)
NonHDL: 62.24
TRIGLYCERIDES: 75 mg/dL (ref 0.0–149.0)
VLDL: 15 mg/dL (ref 0.0–40.0)

## 2018-05-20 LAB — COMPREHENSIVE METABOLIC PANEL
ALBUMIN: 4.6 g/dL (ref 3.5–5.2)
ALK PHOS: 56 U/L (ref 39–117)
ALT: 7 U/L (ref 0–35)
AST: 14 U/L (ref 0–37)
BILIRUBIN TOTAL: 0.9 mg/dL (ref 0.2–1.2)
BUN: 12 mg/dL (ref 6–23)
CALCIUM: 10 mg/dL (ref 8.4–10.5)
CHLORIDE: 105 meq/L (ref 96–112)
CO2: 26 mEq/L (ref 19–32)
CREATININE: 0.73 mg/dL (ref 0.40–1.20)
GFR: 87.03 mL/min (ref >60.00)
Glucose, Bld: 94 mg/dL (ref 70–99)
Potassium: 5.1 mEq/L (ref 3.5–5.1)
SODIUM: 138 meq/L (ref 135–145)
TOTAL PROTEIN: 7.8 g/dL (ref 6.0–8.3)

## 2018-05-20 LAB — FERRITIN: FERRITIN: 13.1 ng/mL (ref 10.0–291.0)

## 2018-05-20 LAB — CBC
HCT: 35.1 % — ABNORMAL LOW (ref 36.0–46.0)
Hemoglobin: 10.9 g/dL — ABNORMAL LOW (ref 12.0–15.0)
MCHC: 31.1 g/dL (ref 30.0–36.0)
MCV: 64.3 fl — AB (ref 78.0–100.0)
PLATELETS: 309 10*3/uL (ref 150.0–400.0)
RBC: 5.45 Mil/uL — AB (ref 3.87–5.11)
RDW: 15.9 % — ABNORMAL HIGH (ref 11.5–15.5)
WBC: 6 10*3/uL (ref 4.0–10.5)

## 2018-05-20 LAB — VITAMIN D 25 HYDROXY (VIT D DEFICIENCY, FRACTURES): VITD: 39.42 ng/mL (ref 30.00–100.00)

## 2018-05-20 LAB — IBC PANEL
IRON: 94 ug/dL (ref 42–145)
Saturation Ratios: 20.4 % (ref 20.0–50.0)
Transferrin: 329 mg/dL (ref 212.0–360.0)

## 2018-05-20 LAB — MAGNESIUM: MAGNESIUM: 1.9 mg/dL (ref 1.5–2.5)

## 2018-05-20 MED ORDER — RIZATRIPTAN BENZOATE 10 MG PO TABS: 10.0000 mg | ORAL_TABLET | ORAL | 0 refills | Status: DC | PRN

## 2018-05-20 MED ORDER — CETIRIZINE HCL 10 MG PO TABS: 10.0000 mg | ORAL_TABLET | Freq: Every day | ORAL | 3 refills | Status: DC

## 2018-05-20 MED ORDER — FOLIC ACID 1 MG PO TABS: 1.0000 mg | ORAL_TABLET | Freq: Every day | ORAL | 3 refills | Status: DC

## 2018-05-20 MED ORDER — MOMETASONE FUROATE 50 MCG/ACT NA SUSP: 2.0000 | Freq: Every day | NASAL | 3 refills | Status: DC

## 2018-05-20 MED ORDER — BUSPIRONE HCL 7.5 MG PO TABS: 7.5000 mg | ORAL_TABLET | Freq: Two times a day (BID) | ORAL | 3 refills | Status: DC

## 2018-05-20 MED ORDER — GUAIFENESIN ER 600 MG PO TB12: 600.0000 mg | ORAL_TABLET | Freq: Two times a day (BID) | ORAL | 2 refills | Status: DC

## 2018-05-20 MED FILL — busPIRone HCL 7.5 MG TABS: 7.5 | 30 days supply | Qty: 60 | Fill #0

## 2018-05-20 MED FILL — SM MUCUS RELIEF 600 MG TB12: 600 | 20 days supply | Qty: 40 | Fill #0

## 2018-05-20 MED FILL — CETIRIZINE HCL 10 MG TABS: 10 | 100 days supply | Qty: 100 | Fill #0

## 2018-05-20 MED FILL — RIZATRIPTAN BENZOATE 10 MG: 10 | 30 days supply | Qty: 10 | Fill #0

## 2018-05-20 MED FILL — MOMETASONE FUROATE 50 MCG S: 50 | 90 days supply | Qty: 51 | Fill #0

## 2018-05-20 NOTE — Patient Instructions (Addendum)
Give [place] 2-3 business days to get the results of your labs back.   Keep the diet clean and stay active.  If you do not hear anything about your referrals in the next 1-2 weeks, call our office and ask for an update.  Let us know if you need anything.

## 2018-05-20 NOTE — Progress Notes (Signed)
Chief Complaint  Patient presents with  . Annual Exam     Well Woman Susan Montoya is here for a complete physical.   Her last physical was >1 year ago.  Current diet: in general, a "healthy" diet. Current exercise: lifting wts and cardio. Weight is stable and she denies daytime fatigue. No LMP recorded. Seatbelt? Yes  Continues to have issues with irritability/anxiety.  She is failed Lexapro, Paxil, and recently Effexor.  She is following with a Social worker.  Work is been stressful and she is raising 2 small children.  Has been having headaches associated with nausea and sensitivity to light/sound.  They last several hours and resolved with sleep.  Headache is left-sided.  No history of migraines officially diagnosed.  Health Maintenance Pap/HPV- Yes 12/09/2017 Mammogram- Yes Tetanus- Yes   Past Medical History:  Diagnosis Date  . Anxiety    xanax but not taking due to breast feeding   . Beta thalassemia minor   . Chronic tonsillitis 09/2013   resolved  - has tonsillectomy  . Crohn's disease ([place])    no current med.  . Decreased fetal movement during pregnancy in third trimester, antepartum 08/07/2014  . GERD (gastroesophageal reflux disease)    occasional  . HSV (herpes simplex virus) anogenital infection   . Indigestion    occasional - protonix prn  . Irregular uterine contractions 08/07/2014  . SVD (spontaneous vaginal delivery)    x 3  . TMJ syndrome    right side     Past Surgical History:  Procedure Laterality Date  . APPENDECTOMY    . COLON RESECTION  age 54   removal of 1 ft. large and small intestine  . LAPAROSCOPIC TUBAL LIGATION Bilateral 11/07/2014   Procedure: LAPAROSCOPIC  BILATERAL TUBAL LIGATION with cautery and fulgeration of endmetrios;  Surgeon: Servando Salina, MD;  Location: [facility];  Service: Gynecology;  Laterality: Bilateral;  . TONSILLECTOMY N/A 10/08/2013   Procedure: TONSILLECTOMY;  Surgeon: Rozetta Nunnery, MD;  Location: [facility];  Service: ENT;  Laterality: N/A;  . WISDOM TOOTH EXTRACTION      Medications  Current Outpatient Medications on File Prior to Visit  Medication Sig Dispense Refill  . acetaminophen (TYLENOL) 325 MG tablet Take 650 mg by mouth every 6 (six) hours as needed for mild pain.    Marland Kitchen acyclovir cream (ZOVIRAX) 5 % Apply 5 times daily for 4 days during flares. 5 g 2  . ALPRAZolam (XANAX) 0.5 MG tablet Take 1/2 to 1 tablet daily as needed. 30 tablet 2  . cetirizine (ZYRTEC) 10 MG tablet Take 10 mg by mouth daily.    . folic acid (FOLVITE) 1 MG tablet Take 1 tablet (1 mg total) by mouth daily. 30 tablet 2  . guaiFENesin (MUCINEX) 600 MG 12 hr tablet Take by mouth 2 (two) times daily.    . mometasone (NASONEX) 50 MCG/ACT nasal spray Place 2 sprays into the nose daily. 17 g 5  . Probiotic Product (PROBIOTIC PO) Take 1 tablet by mouth once a week.     . valACYclovir (VALTREX) 500 MG tablet Take 1 tablet (500 mg total) by mouth daily. 30 tablet 3  . fluticasone (FLONASE) 50 MCG/ACT nasal spray Place 2 sprays into both nostrils daily for 10 days. 16 g 0    Allergies No Known Allergies  Review of Systems: Constitutional:  no unexpected weight changes Eye:  no recent significant change in vision Ear/Nose/Mouth/Throat:  Ears:  no tinnitus or vertigo  and no recent change in hearing Nose/Mouth/Throat:  no complaints of nasal congestion, no sore throat Cardiovascular: no chest pain Respiratory:  no cough and no shortness of breath Gastrointestinal:  no abdominal pain, no change in bowel habits GU:  Female: negative for dysuria or pelvic pain, +vag bleeding (following with GYN) Musculoskeletal/Extremities:  no pain of the joints Integumentary (Skin/Breast):  no abnormal skin lesions reported Neurologic:  No current headache Endocrine:  denies fatigue Hematologic/Lymphatic:  No areas of easy bleeding  Exam BP 108/68 (BP Location: Left Arm, Patient Position: Sitting, Cuff Size: Normal)    Pulse 65   Temp 97.6 F (36.4 C) (Oral)   Ht 5' 3"  (1.6 m)   Wt 135 lb (61.2 kg)   SpO2 98%   BMI 23.91 kg/m  General:  well developed, well nourished, in no apparent distress Skin:  no significant moles, warts, or growths Head:  no masses, lesions, or tenderness Eyes:  pupils equal and round, sclera anicteric without injection Ears:  canals without lesions, TMs shiny without retraction, no obvious effusion, no erythema Nose:  nares patent, septum midline, mucosa normal, and no drainage or sinus tenderness Throat/Pharynx:  lips and gingiva without lesion; tongue and uvula midline; non-inflamed pharynx; no exudates or postnasal drainage Neck: neck supple without adenopathy, thyromegaly, or masses Lungs:  clear to auscultation, breath sounds equal bilaterally, no respiratory distress Cardio:  regular rate and rhythm, no bruits, no LE edema Abdomen:  abdomen soft, nontender; bowel sounds normal; no masses or organomegaly Genital: Defer to GYN Musculoskeletal:  symmetrical muscle groups noted without atrophy or deformity Extremities:  no clubbing, cyanosis, or edema, no deformities, no skin discoloration Neuro:  gait normal; deep tendon reflexes normal and symmetric Psych: well oriented with normal range of affect and appropriate judgment/insight  Assessment and Plan  Well adult exam - Plan: CBC, Comprehensive metabolic panel, Lipid panel, Magnesium  Crohn's disease with complication, unspecified gastrointestinal tract location ([place]) - Plan: Vitamin D (25 hydroxy), Ambulatory referral to Allergy  Screening for HIV (human immunodeficiency virus) - Plan: HIV Antibody (routine testing w rflx)  Routine screening for STI (sexually transmitted infection)  Anemia, unspecified type - Plan: Ferritin, IBC panel, B12  Anxiety state - Plan: busPIRone (BUSPAR) 7.5 MG tablet  Vagina bleeding - Plan: Ambulatory referral to Gynecology  Nonintractable headache, unspecified chronicity pattern,  unspecified headache type - Plan: rizatriptan (MAXALT) 10 MG tablet   Well 43 y.o. female. Counseled on diet and exercise. Needs referrals as above for ins purposes. Try BuSpar for anxiety. Cont w counseling. Consider TCA in future or even atypical antipsychotic. She has not done well with serotonin affecting meds. Maxalt prn, sounds like migraines. Other orders as above. Follow up in 6 weeks. The patient voiced understanding and agreement to the plan.  [place], DO 05/20/18 10:52 AM

## 2018-05-20 NOTE — Addendum Note (Signed)
Addended by: Sharon Seller B on: 05/20/2018 11:02 AM   Modules accepted: Orders

## 2018-05-20 NOTE — Progress Notes (Signed)
Pre visit review using our clinic review tool, if applicable. No additional management support is needed unless otherwise documented below in the visit note. 

## 2018-05-21 LAB — URINE CYTOLOGY ANCILLARY ONLY
Chlamydia: NEGATIVE
Neisseria Gonorrhea: NEGATIVE
Trichomonas: NEGATIVE

## 2018-05-21 LAB — HIV ANTIBODY (ROUTINE TESTING W REFLEX): HIV 1&2 Ab, 4th Generation: NONREACTIVE

## 2018-05-21 LAB — VITAMIN B12: Vitamin B-12: 407 pg/mL (ref 211–911)

## 2018-05-26 ENCOUNTER — Encounter: Payer: Self-pay | Admitting: Family Medicine

## 2018-05-26 DIAGNOSIS — M6289 Other specified disorders of muscle: Secondary | ICD-10-CM

## 2018-05-28 ENCOUNTER — Ambulatory Visit (INDEPENDENT_AMBULATORY_CARE_PROVIDER_SITE_OTHER): Payer: Self-pay | Admitting: Physician Assistant

## 2018-05-28 VITALS — BP 114/70 | HR 72 | Temp 97.8°F | Resp 20 | Wt 136.4 lb

## 2018-05-28 DIAGNOSIS — J0110 Acute frontal sinusitis, unspecified: Secondary | ICD-10-CM

## 2018-05-28 DIAGNOSIS — J029 Acute pharyngitis, unspecified: Secondary | ICD-10-CM

## 2018-05-28 DIAGNOSIS — J069 Acute upper respiratory infection, unspecified: Secondary | ICD-10-CM

## 2018-05-28 LAB — POCT RAPID STREP A (OFFICE): Rapid Strep A Screen: NEGATIVE

## 2018-05-28 LAB — POCT INFLUENZA A/B
Influenza A, POC: NEGATIVE
Influenza B, POC: NEGATIVE

## 2018-05-28 MED ORDER — PSEUDOEPH-BROMPHEN-DM 30-2-10 MG/5ML PO SYRP: 5.0000 mL | ORAL_SOLUTION | Freq: Three times a day (TID) | ORAL | 0 refills | Status: DC | PRN

## 2018-05-28 MED ORDER — BENZONATATE 100 MG PO CAPS: 100.0000 mg | ORAL_CAPSULE | Freq: Three times a day (TID) | ORAL | 0 refills | Status: DC | PRN

## 2018-05-28 MED ORDER — AZELASTINE HCL 0.1 % NA SOLN: 1.0000 | Freq: Two times a day (BID) | NASAL | 0 refills | Status: DC

## 2018-05-28 MED FILL — AZELASTINE HCL 137 MCG SPRY: 0.1 | 50 days supply | Qty: 30 | Fill #0

## 2018-05-28 MED FILL — BROMPHENIR-PSEUDOEPHED-DM S: 30-2-10 | 8 days supply | Qty: 120 | Fill #0

## 2018-05-28 NOTE — Patient Instructions (Addendum)
Viral Respiratory Infection  This is consistent with viral URI. I recommend rest, oral hydration, and eating light meals.   Use tessalon perles to stop the cough. Use mucinex if you want to clear out the mucus. You can use the cough syrup throughout the day as it has a decongestant in it as well and will help your congestion and drainage.   For nasal congestion, continue flonase. Add on astelin in the morning and nasal saline rinse at night time.   If your sinus congestion does not improved after 3-5 days, contact our office.  If any of your other symptoms worsen or do not improve over 7-10 days, follow up with family doctor or urgent care.   Thank you for letting me participate in your health and well being.  A viral respiratory infection is an illness that affects parts of the body that are used for breathing. These include the lungs, nose, and throat. It is caused by a germ called a virus. Some examples of this kind of infection are:  A cold.  The flu (influenza).  A respiratory syncytial virus (RSV) infection. A person who gets this illness may have the following symptoms:  A stuffy or runny nose.  Yellow or green fluid in the nose.  A cough.  Sneezing.  Tiredness (fatigue).  Achy muscles.  A sore throat.  Sweating or chills.  A fever.  A headache. Follow these instructions at home: Managing pain and congestion  Take over-the-counter and prescription medicines only as told by your doctor.  If you have a sore throat, gargle with salt water. Do this 3-4 times per day or as needed. To make a salt-water mixture, dissolve -1 tsp of salt in 1 cup of warm water. Make sure that all the salt dissolves.  Use nose drops made from salt water. This helps with stuffiness (congestion). It also helps soften the skin around your nose.  Drink enough fluid to keep your pee (urine) pale yellow. General instructions   Rest as much as possible.  Do not drink  alcohol.  Do not use any products that have nicotine or tobacco, such as cigarettes and e-cigarettes. If you need help quitting, ask your doctor.  Keep all follow-up visits as told by your doctor. This is important. How is this prevented?   Get a flu shot every year. Ask your doctor when you should get your flu shot.  Do not let other people get your germs. If you are sick: ? Stay home from work or school. ? Wash your hands with soap and water often. Wash your hands after you cough or sneeze. If soap and water are not available, use hand sanitizer.  Avoid contact with people who are sick during cold and flu season. This is in fall and winter. Get help if:  Your symptoms last for 10 days or longer.  Your symptoms get worse over time.  You have a fever.  You have very bad pain in your face or forehead.  Parts of your jaw or neck become very swollen. Get help right away if:  You feel pain or pressure in your chest.  You have shortness of breath.  You faint or feel like you will faint.  You keep throwing up (vomiting).  You feel confused. Summary  A viral respiratory infection is an illness that affects parts of the body that are used for breathing.  Examples of this illness include a cold, the flu, and respiratory syncytial virus (RSV) infection.  The infection can cause a runny nose, cough, sneezing, sore throat, and fever.  Follow what your doctor tells you about taking medicines, drinking lots of fluid, washing your hands, resting at home, and avoiding people who are sick. This information is not intended to replace advice given to you by your health care provider. Make sure you discuss any questions you have with your health care provider. Document Released: 03/28/2008 Document Revised: 05/26/2017 Document Reviewed: 05/26/2017 Elsevier Interactive Patient Education  2019 [employer].

## 2018-05-28 NOTE — Telephone Encounter (Signed)
Dr. Nani Ravens. This patient came in today to do labs but no labs were ordered. Please send her a message on my chart if you want her to do some labs.

## 2018-05-28 NOTE — Progress Notes (Signed)
MRN: [id number] DOB: Feb 02, 1976  Subjective:   Susan Montoya is a 43 y.o. female presenting for chief complaint of sore throat, congestion, nasal drainage, x5 .  Reports 5 day history of illness. Started out with scratchy throat, then develop nasal drainage, sinus congestion, post nasal drip, and cough. Mucus production has green color to it. Denies fever, body aches, inability to swallow, voice change, wheezing, shortness of breath, dyspnea on exertion, chest pain, nausea, vomiting, abdominal pain and diarrhea. Has tried mucinex, tessalon perles, and flonase with some relief. Sick contact exposure to nephew w/ similar sx. Has PMH of seasonal allergies, denies history of asthma, DM, HTN. Patient has had flu shot this season. Denies smoking. PSH tonsillectomy.  Denies any other aggravating or relieving factors, no other questions or concerns.  Review of Systems  Constitutional: Negative for diaphoresis.  Respiratory: Negative for hemoptysis.   Skin: Negative for rash.  Neurological: Negative for dizziness.    Susan Montoya has a current medication list which includes the following prescription(s): acetaminophen, acyclovir cream, alprazolam, buspirone, cetirizine, folic acid, guaifenesin, mometasone, probiotic product, rizatriptan, valacyclovir, azelastine, benzonatate, and brompheniramine-pseudoephedrine-dm. Also has No Known Allergies.  Susan Montoya  has a past medical history of Anxiety, Beta thalassemia minor, Chronic tonsillitis (09/2013), Crohn's disease ([place]), Decreased fetal movement during pregnancy in third trimester, antepartum (08/07/2014), GERD (gastroesophageal reflux disease), HSV (herpes simplex virus) anogenital infection, Indigestion, Irregular uterine contractions (08/07/2014), SVD (spontaneous vaginal delivery), and TMJ syndrome. Also  has a past surgical history that includes Appendectomy; Colon resection (age 7); Tonsillectomy (N/A, 10/08/2013); Wisdom tooth extraction; and Laparoscopic tubal  ligation (Bilateral, 11/07/2014).   Objective:   Vitals: BP 114/70 (BP Location: Right Arm, Patient Position: Sitting, Cuff Size: Normal)   Pulse 72   Temp 97.8 F (36.6 C) (Oral)   Resp 20   Wt 136 lb 6.4 oz (61.9 kg)   SpO2 98%   BMI 24.16 kg/m   Physical Exam Vitals signs reviewed.  Constitutional:      General: She is not in acute distress.    Appearance: She is well-developed. She is not ill-appearing or toxic-appearing.  HENT:     Head: Normocephalic and atraumatic.     Right Ear: Ear canal and external ear normal. A middle ear effusion is present. No mastoid tenderness. Tympanic membrane is not erythematous or bulging.     Left Ear: Ear canal and external ear normal. A middle ear effusion is present. No mastoid tenderness. Tympanic membrane is not erythematous or bulging.     Nose: Mucosal edema and congestion present.     Right Turbinates: Enlarged.     Left Turbinates: Enlarged.     Right Sinus: No maxillary sinus tenderness or frontal sinus tenderness.     Left Sinus: No maxillary sinus tenderness or frontal sinus tenderness.     Mouth/Throat:     Lips: Pink.     Mouth: Mucous membranes are moist.     Pharynx: Uvula midline. Posterior oropharyngeal erythema present. No pharyngeal swelling, oropharyngeal exudate or uvula swelling.     Tonsils: Swelling: 0 on the right. 0 on the left.  Eyes:     Conjunctiva/sclera: Conjunctivae normal.  Neck:     Musculoskeletal: Normal range of motion.  Cardiovascular:     Rate and Rhythm: Normal rate and regular rhythm.     Heart sounds: Normal heart sounds.  Pulmonary:     Effort: Pulmonary effort is normal.     Breath sounds: Normal breath sounds. No decreased breath sounds, wheezing,  rhonchi or rales.  Lymphadenopathy:     Head:     Right side of head: No submental, submandibular, tonsillar, preauricular, posterior auricular or occipital adenopathy.     Left side of head: No submental, submandibular, tonsillar, preauricular,  posterior auricular or occipital adenopathy.     Cervical: No cervical adenopathy.     Upper Body:     Right upper body: No supraclavicular adenopathy.     Left upper body: No supraclavicular adenopathy.  Skin:    General: Skin is warm and dry.  Neurological:     Mental Status: She is alert.     Results for orders placed or performed in visit on 05/28/18 (from the past 24 hour(s))  POCT rapid strep A     Status: Normal   Collection Time: 05/28/18 12:11 PM  Result Value Ref Range   Rapid Strep A Screen Negative Negative  POCT Influenza A/B     Status: Normal   Collection Time: 05/28/18 12:11 PM  Result Value Ref Range   Influenza A, POC Negative Negative   Influenza B, POC Negative Negative    Assessment and Plan :  1. Viral URI Patient is overall well-appearing, no acute distress.  VSS. POC testing obtained per pt's request-negative for flu and strep, pt reassured. History and physical exam are consistent with viral URI.  Given educational material on viral URI.  Recommend symptomatic treatment at this time.  Advised to contact office if sinus pressure persists/worsens over the next 3 to 5 days, would consider Rx for antibiotic at that time.  Advised to follow-up in office, with family doctor, or local urgent care if no improvement in symptoms after 7 to 10 days.  Seek care sooner at local urgent care or ED if symptoms worsen/develop new concerning symptoms.  Patient voices understanding. - brompheniramine-pseudoephedrine-DM 30-2-10 MG/5ML syrup; Take 5 mLs by mouth 3 (three) times daily as needed.  Dispense: 120 mL; Refill: 0 - benzonatate (TESSALON) 100 MG capsule; Take 1-2 capsules (100-200 mg total) by mouth 3 (three) times daily as needed for cough.  Dispense: 40 capsule; Refill: 0 - azelastine (ASTELIN) 0.1 % nasal spray; Place 1 spray into both nostrils 2 (two) times daily. Use in each nostril as directed  Dispense: 30 mL; Refill: 0  2. Sore throat - POCT rapid strep A - POCT  Influenza A/B   Tenna Delaine, PA-C  [facility] 05/28/2018 12:22 PM

## 2018-05-29 ENCOUNTER — Other Ambulatory Visit (INDEPENDENT_AMBULATORY_CARE_PROVIDER_SITE_OTHER): Payer: No Typology Code available for payment source

## 2018-05-29 DIAGNOSIS — M6289 Other specified disorders of muscle: Secondary | ICD-10-CM | POA: Diagnosis not present

## 2018-06-03 ENCOUNTER — Encounter: Payer: Self-pay | Admitting: Physician Assistant

## 2018-06-03 ENCOUNTER — Encounter: Payer: Self-pay | Admitting: Family Medicine

## 2018-06-03 MED ORDER — AMOXICILLIN-POT CLAVULANATE 875-125 MG PO TABS: 1.0000 | ORAL_TABLET | Freq: Two times a day (BID) | ORAL | 0 refills | Status: AC

## 2018-06-03 MED FILL — AMOX-CLAV 875-125 MG TABLET: 875-125 | 10 days supply | Qty: 20 | Fill #0

## 2018-06-03 NOTE — Addendum Note (Signed)
Addended by: Shella Maxim on: 06/03/2018 04:56 PM   Modules accepted: Orders

## 2018-06-03 NOTE — Progress Notes (Signed)
Patient called back after 6 days of supportive care with no real improvement and increase sinus pressure and pain. Will call in abx per PA Garvin Fila recommendation at date of visit if not improvement. Discussed with patient Augmentin 875 mg BID x 10 days- [facility] to [facility].

## 2018-06-04 LAB — ACETYLCHOLINE RECEPTOR AB, ALL
ACHR BINDING AB, SERUM: 0.08 nmol/L (ref 0.00–0.24)
Acetylchol Block Ab: 24 % (ref 0–25)
Acetylcholine Modulat Ab: <12 % (ref 0–20)

## 2018-06-08 ENCOUNTER — Telehealth: Payer: Self-pay | Admitting: *Deleted

## 2018-06-08 NOTE — Telephone Encounter (Signed)
Received Lab Report results from [facility]; forwarded to provider/SLS 02/10

## 2018-06-14 ENCOUNTER — Telehealth: Payer: No Typology Code available for payment source | Admitting: Family

## 2018-06-14 DIAGNOSIS — R6889 Other general symptoms and signs: Secondary | ICD-10-CM

## 2018-06-14 DIAGNOSIS — Z20828 Contact with and (suspected) exposure to other viral communicable diseases: Secondary | ICD-10-CM

## 2018-06-14 MED ORDER — OSELTAMIVIR PHOSPHATE 75 MG PO CAPS: 75.0000 mg | ORAL_CAPSULE | Freq: Two times a day (BID) | ORAL | 0 refills | Status: DC

## 2018-06-14 NOTE — Progress Notes (Signed)
E visit for Flu like symptoms   We are sorry that you are not feeling well.  Here is how we plan to help! Based on what you have shared with me it looks like you may have possible exposure to a virus that causes influenza.  Influenza or "the flu" is   an infection caused by a respiratory virus. The flu virus is highly contagious and persons who did not receive their yearly flu vaccination may "catch" the flu from close contact.   Approximately five minutes was spent reviewing the document and patient chart.  We have anti-viral medications to treat the viruses that cause this infection. They are not a "cure" and only shorten the course of the infection. These prescriptions are most effective when they are given within the first 2 days of "flu" symptoms. Antiviral medication are indicated if you have a high risk of complications from the flu. You should  also consider an antiviral medication if you are in close contact with someone who is at risk. These medications can help patients avoid complications from the flu  but have side effects that you should know. Possible side effects from Tamiflu or oseltamivir include nausea, vomiting, diarrhea, dizziness, headaches, eye redness, sleep problems or other respiratory symptoms. You should not take Tamiflu if you have an allergy to oseltamivir or any to the ingredients in Tamiflu.  Based upon your symptoms and potential risk factors I have prescribed Oseltamivir (Tamiflu).  It has been sent to your designated pharmacy.  You will take one 75 mg capsule orally twice a day for the next 5 days.  ANYONE WHO HAS FLU SYMPTOMS SHOULD: . Stay home. The flu is highly contagious and going out or to work exposes others! . Be sure to drink plenty of fluids. Water is fine as well as fruit juices, sodas and electrolyte beverages. You may want to stay away from caffeine or alcohol. If you are nauseated, try taking small sips of liquids. How do you know if you are getting  enough fluid? Your urine should be a pale yellow or almost colorless. . Get rest. . Taking a steamy shower or using a humidifier may help nasal congestion and ease sore throat pain. Using a saline nasal spray works much the same way. . Cough drops, hard candies and sore throat lozenges may ease your cough. . Line up a caregiver. Have someone check on you regularly.   GET HELP RIGHT AWAY IF: . You cannot keep down liquids or your medications. . You become short of breath . Your fell like you are going to pass out or loose consciousness. . Your symptoms persist after you have completed your treatment plan MAKE SURE YOU   Understand these instructions.  Will watch your condition.  Will get help right away if you are not doing well or get worse.  Your e-visit answers were reviewed by a board certified advanced clinical practitioner to complete your personal care plan.  Depending on the condition, your plan could have included both over the counter or prescription medications.  If there is a problem please reply  once you have received a response from your provider.  Your safety is important to [place].  If you have drug allergies check your prescription carefully.    You can use MyChart to ask questions about today's visit, request a non-urgent call back, or ask for a work or school excuse for 24 hours related to this e-Visit. If it has been greater than 24 hours  you will need to follow up with your provider, or enter a new e-Visit to address those concerns.  You will get an e-mail in the next two days asking about your experience.  I hope that your e-visit has been valuable and will speed your recovery. Thank you for using e-visits.

## 2018-06-15 MED FILL — OSELTAMIVIR PHOSPHATE 75 MG: 75 | 5 days supply | Qty: 10 | Fill #0

## 2018-06-23 MED FILL — busPIRone HCL 7.5 MG TABS: 7.5 | 30 days supply | Qty: 60 | Fill #1

## 2018-07-01 ENCOUNTER — Ambulatory Visit: Payer: No Typology Code available for payment source | Admitting: Family Medicine

## 2018-07-07 ENCOUNTER — Encounter: Payer: Self-pay | Admitting: Pediatrics

## 2018-07-07 ENCOUNTER — Ambulatory Visit (INDEPENDENT_AMBULATORY_CARE_PROVIDER_SITE_OTHER): Payer: No Typology Code available for payment source | Admitting: Pediatrics

## 2018-07-07 VITALS — BP 108/70 | HR 62 | Temp 98.3°F | Resp 14 | Ht 62.6 in | Wt 131.8 lb

## 2018-07-07 DIAGNOSIS — J301 Allergic rhinitis due to pollen: Secondary | ICD-10-CM

## 2018-07-07 DIAGNOSIS — L503 Dermatographic urticaria: Secondary | ICD-10-CM

## 2018-07-07 DIAGNOSIS — H1010 Acute atopic conjunctivitis, unspecified eye: Secondary | ICD-10-CM

## 2018-07-07 DIAGNOSIS — D561 Beta thalassemia: Secondary | ICD-10-CM

## 2018-07-07 DIAGNOSIS — K5080 Crohn's disease of both small and large intestine without complications: Secondary | ICD-10-CM

## 2018-07-07 NOTE — Patient Instructions (Addendum)
Environmental control of dust mite Zyrtec 10 mg-take 1 tablet once a day if needed for runny nose or itching or itchy eyes Fluticasone 2 sprays per nostril once a day if needed for stuffy nose Do foods with salicylates make you itch? If you need a probiotic , VSL #3 would be your best choice Continue avoiding foods that can affect Crohn's disease

## 2018-07-07 NOTE — Progress Notes (Addendum)
[place] [address] Dept: [phone]  New Patient Note  Patient ID: Susan Montoya, female    DOB: [birth year]  Age: 43 y.o. MRN: [id number] Date of Office Visit: 07/07/2018 Referring provider: Shelda Pal, DO [address] [place], [place] [address]    Chief Complaint: Food Intolerance (pt has Crohn's Disease.  wants to know what foods to avoid.  ) and Allergic Rhinitis  (fall season worse)  HPI [name] presents for an allergy evaluation.  She is concerned that certain foods that  she could be allergic to , could be aggravating her Crohn's disease.  She was diagnosed with Crohn's disease at age 62.  She finds that popcorn, raw vegetables and fried foods give her abdominal discomfort.  Fried foods give her diarrhea.  She has had removal of one foot of intestines from either side of the ileocecal valve She has a history of seasonal allergic rhinitis for many years.  Her symptoms are worse in the spring and fall of the year.  She uses Zyrtec 10 mg once a day.  At times she has itching of her skin without a clear-cut reason.  She has not had asthmatic symptoms or eczema or chronic urticaria  Review of Systems  Constitutional: Negative.   HENT:       Seasonal allergic rhinitis worse in the spring and fall of the year  Eyes: Negative.   Respiratory: Negative.   Cardiovascular: Negative.   Gastrointestinal:       Crohn's disease since age 89 with  removal of intestines 1 foot on either side of the ileocecal valve  Genitourinary: Negative.   Musculoskeletal: Negative.   Skin:       Itchy skin at times  Neurological: Negative.   Endo/Heme/Allergies:       No diabetes or thyroid disease  Psychiatric/Behavioral: Negative.     Outpatient Encounter Medications as of 07/07/2018  Medication Sig  . acetaminophen (TYLENOL) 325 MG tablet Take 650 mg by mouth every 6 (six) hours as needed for mild pain.  Marland Kitchen acyclovir cream (ZOVIRAX) 5 % Apply 5  times daily for 4 days during flares.  . ALPRAZolam (XANAX) 0.5 MG tablet Take 1/2 to 1 tablet daily as needed.  . busPIRone (BUSPAR) 7.5 MG tablet Take 1 tablet (7.5 mg total) by mouth 2 (two) times daily.  . cetirizine (ZYRTEC) 10 MG tablet Take 1 tablet (10 mg total) by mouth daily.  . folic acid (FOLVITE) 1 MG tablet Take 1 tablet (1 mg total) by mouth daily.  Marland Kitchen guaiFENesin (MUCINEX) 600 MG 12 hr tablet Take 1 tablet (600 mg total) by mouth 2 (two) times daily.  . mometasone (NASONEX) 50 MCG/ACT nasal spray Place 2 sprays into the nose daily. (Patient taking differently: Place 2 sprays into the nose daily as needed. )  . Probiotic Product (PROBIOTIC PO) Take 1 tablet by mouth once a week.   . rizatriptan (MAXALT) 10 MG tablet Take 1 tablet (10 mg total) by mouth as needed for migraine. May repeat in 2 hours if needed  . valACYclovir (VALTREX) 500 MG tablet Take 1 tablet (500 mg total) by mouth daily. (Patient taking differently: Take 500 mg by mouth daily as needed. )  . azelastine (ASTELIN) 0.1 % nasal spray Place 1 spray into both nostrils 2 (two) times daily. Use in each nostril as directed (Patient not taking: Reported on 07/07/2018)  . benzonatate (TESSALON) 100 MG capsule Take 1-2 capsules (100-200  mg total) by mouth 3 (three) times daily as needed for cough. (Patient not taking: Reported on 07/07/2018)  . brompheniramine-pseudoephedrine-DM 30-2-10 MG/5ML syrup Take 5 mLs by mouth 3 (three) times daily as needed. (Patient not taking: Reported on 07/07/2018)  . [DISCONTINUED] oseltamivir (TAMIFLU) 75 MG capsule Take 1 capsule (75 mg total) by mouth 2 (two) times daily.   No facility-administered encounter medications on file as of 07/07/2018.      Drug Allergies:  No Known Allergies  Family History: Armella's family history includes Allergic rhinitis in her mother and son; Anemia in her maternal grandmother; Asthma in her brother; Cancer in her maternal grandfather, mother, and paternal  grandmother; Diabetes in her paternal grandfather and paternal grandmother; Heart disease in her father and maternal grandfather; Hyperlipidemia in her maternal grandfather and mother; Hypertension in her brother, father, maternal grandfather, maternal grandmother, and mother; Stroke in her maternal grandfather; Yves Dill Parkinson White syndrome in her father..  Family history is positive for eczema.  Family history is negative for sinus problems, angioedema, chronic urticaria, food allergies lupus, chronic bronchitis or emphysema.  Social and environmental.  There is a dog in the home.  She is not exposed to cigarette smoking.  She has never smoked cigarettes in the past.  She is a Marine scientist at [employer].  Physical Exam: BP 108/70 (BP Location: Right Arm, Patient Position: Sitting, Cuff Size: Normal)   Pulse 62   Temp 98.3 F (36.8 C) (Oral)   Resp 14   Ht 5' 2.6" (1.59 m)   Wt 131 lb 12.8 oz (59.8 kg)   SpO2 100%   BMI 23.65 kg/m    Physical Exam Vitals signs reviewed.  Constitutional:      Appearance: Normal appearance. She is normal weight.  HENT:     Head:     Comments: Eyes normal.  Ears normal.  Nose normal.  Pharynx normal. Neck:     Musculoskeletal: Neck supple.     Comments: No thyromegaly Pulmonary:     Comments: Clear to percussion and auscultation Abdominal:     Palpations: Abdomen is soft.     Comments: No hepatosplenomegaly.  She had mild tenderness in the left lower quadrant  Lymphadenopathy:     Cervical: No cervical adenopathy.  Skin:    Comments: Clear with mild dermographia noted  Neurological:     General: No focal deficit present.     Mental Status: She is alert and oriented to person, place, and time.  Psychiatric:        Mood and Affect: Mood normal.        Behavior: Behavior normal.        Thought Content: Thought content normal.        Judgment: Judgment normal.     Diagnostics:  Allergy skin test were positive to tree pollens, ragweed,  cockroach.  Slight reaction noted to mite.  Skin testing to foods was negative  Assessment  Assessment and Plan: 1. Seasonal allergic rhinitis due to pollen   2. Seasonal allergic conjunctivitis   3. Crohn's disease of both small and large intestine without complication ([place])   4. 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 Dermographia     No orders of the defined types were placed in this encounter.   Patient Instructions  Environmental control of dust mite Zyrtec 10 mg-take 1 tablet once a day if needed for runny nose or itching or itchy eyes Fluticasone 2 sprays per nostril once a day if needed for stuffy nose Do  foods with salicylates make you itch? If you need a probiotic , VSL #3 would be your best choice Continue avoiding foods that can affect Crohn's disease   Return if symptoms worsen or fail to improve.   Thank you for the opportunity to care for this patient.  Please do not hesitate to contact me with questions.  Penne Lash, M.D.  [facility] [address] [place], [place] [address] [phone]

## 2018-07-08 ENCOUNTER — Other Ambulatory Visit: Payer: Self-pay

## 2018-07-08 ENCOUNTER — Encounter: Payer: Self-pay | Admitting: Family Medicine

## 2018-07-08 ENCOUNTER — Ambulatory Visit (INDEPENDENT_AMBULATORY_CARE_PROVIDER_SITE_OTHER): Payer: No Typology Code available for payment source | Admitting: Family Medicine

## 2018-07-08 VITALS — BP 102/68 | HR 101 | Temp 98.4°F | Ht 63.0 in | Wt 129.5 lb

## 2018-07-08 DIAGNOSIS — J301 Allergic rhinitis due to pollen: Secondary | ICD-10-CM | POA: Insufficient documentation

## 2018-07-08 DIAGNOSIS — L503 Dermatographic urticaria: Secondary | ICD-10-CM | POA: Insufficient documentation

## 2018-07-08 DIAGNOSIS — F410 Panic disorder [episodic paroxysmal anxiety] without agoraphobia: Secondary | ICD-10-CM

## 2018-07-08 DIAGNOSIS — K5080 Crohn's disease of both small and large intestine without complications: Secondary | ICD-10-CM | POA: Insufficient documentation

## 2018-07-08 DIAGNOSIS — H1010 Acute atopic conjunctivitis, unspecified eye: Secondary | ICD-10-CM | POA: Insufficient documentation

## 2018-07-08 DIAGNOSIS — F411 Generalized anxiety disorder: Secondary | ICD-10-CM | POA: Diagnosis not present

## 2018-07-08 MED ORDER — ALPRAZOLAM 0.5 MG PO TABS: ORAL_TABLET | ORAL | 2 refills | Status: DC

## 2018-07-08 MED FILL — ALPRAZolam 0.5 MG TABS: 0.5 | 30 days supply | Qty: 30 | Fill #0

## 2018-07-08 NOTE — Patient Instructions (Signed)
Stay active.  Let us know when you need refills.  Let us know if you need anything.

## 2018-07-08 NOTE — Progress Notes (Signed)
Chief Complaint  Patient presents with  . Follow-up    Subjective: Patient is a 43 y.o. female here for f/u.  She was started on BuSpar 7.5 mg twice daily.  She takes the first dose religiously but sometimes forgets to take the second dose.  She feels much better overall.  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  New technology was also introduced that is stressful for many workers.  She is staying very active and continues to train for 1/2 marathons.  Started on Maxalt for headaches.  She has not used it in the past month as she has not had a headache.   ROS:  Neuro: No headaches  Past Medical History:  Diagnosis Date  . Anxiety    xanax but not taking due to breast feeding   . Beta thalassemia minor   . Chronic tonsillitis 09/2013   resolved  - has tonsillectomy  . Crohn's disease ([place])    no current med.  . Decreased fetal movement during pregnancy in third trimester, antepartum 08/07/2014  . GERD (gastroesophageal reflux disease)    occasional  . HSV (herpes simplex virus) anogenital infection   . Indigestion    occasional - protonix prn  . Irregular uterine contractions 08/07/2014  . SVD (spontaneous vaginal delivery)    x 3  . TMJ syndrome    right side    Objective: BP 102/68 (BP Location: Left Arm, Patient Position: Sitting, Cuff Size: Normal)   Pulse (!) 101   Temp 98.4 F (36.9 C) (Oral)   Ht 5' 3"  (1.6 m)   Wt 129 lb 8 oz (58.7 kg)   SpO2 98%   BMI 22.94 kg/m  General: Awake, appears stated age HEENT: MMM, EOMi Heart: RRR Lungs: CTAB, no rales, wheezes or rhonchi. No accessory muscle use Neuro: DTRs equal and symmetric throughout, no cerebellar signs or clonus. Psych: Age appropriate judgment and insight, normal affect and mood  Assessment and Plan: Anxiety state  Panic attacks - Plan: ALPRAZolam (XANAX) 0.5 MG tablet  Continue BuSpar.  Refill Xanax.  She is using this  sparingly. Counseled on exercise. We will see how Maxalt does if she does get a headache. Follow-up in 6 months or as needed. The patient voiced understanding and agreement to the plan.  [place], DO 07/08/18  12:13 PM

## 2018-07-20 MED FILL — BUSPIRONE HCL 7.5 MG TABS: 7.5 | 30 days supply | Qty: 60 | Fill #2

## 2018-07-20 MED FILL — VALACYCLOVIR HCL 500 MG TAB: 500 | 30 days supply | Qty: 30 | Fill #1

## 2018-08-21 MED FILL — CETIRIZINE HCL 10 MG TABS: 10 | 100 days supply | Qty: 100 | Fill #1

## 2018-09-14 MED FILL — BUSPIRONE HCL 7.5 MG TABS: 7.5 | 30 days supply | Qty: 60 | Fill #3

## 2018-09-14 MED FILL — VALACYCLOVIR HCL 500 MG TAB: 500 | 30 days supply | Qty: 30 | Fill #2

## 2018-10-24 ENCOUNTER — Encounter: Payer: Self-pay | Admitting: Family Medicine

## 2018-10-24 ENCOUNTER — Other Ambulatory Visit: Payer: Self-pay | Admitting: Family Medicine

## 2018-10-24 DIAGNOSIS — L237 Allergic contact dermatitis due to plants, except food: Secondary | ICD-10-CM

## 2018-10-24 MED ORDER — PREDNISONE 50 MG PO TABS: ORAL_TABLET | ORAL | 0 refills | Status: AC

## 2018-10-24 NOTE — Progress Notes (Signed)
Patient with typical findings of contact dermatitis with rash and h/o yard work. 8 days of topicals and no significant improvement. rx sent in for steroid burst.

## 2018-10-26 ENCOUNTER — Ambulatory Visit (INDEPENDENT_AMBULATORY_CARE_PROVIDER_SITE_OTHER): Payer: No Typology Code available for payment source | Admitting: Family Medicine

## 2018-10-26 ENCOUNTER — Other Ambulatory Visit: Payer: Self-pay

## 2018-10-26 ENCOUNTER — Encounter: Payer: Self-pay | Admitting: Family Medicine

## 2018-10-26 DIAGNOSIS — R21 Rash and other nonspecific skin eruption: Secondary | ICD-10-CM

## 2018-10-26 MED ORDER — TRIAMCINOLONE ACETONIDE 0.1 % EX CREA: 1.0000 "application " | TOPICAL_CREAM | Freq: Two times a day (BID) | CUTANEOUS | 0 refills | Status: DC

## 2018-10-26 MED FILL — ALPRAZOLAM 0.5 MG TABS: 0.5 | 30 days supply | Qty: 30 | Fill #1

## 2018-10-26 MED FILL — TRIAMCINOLONE 0.1% CREAM: 0.1 | 15 days supply | Qty: 30 | Fill #0

## 2018-10-26 NOTE — Progress Notes (Signed)
CC: Rash  Susan Montoya is a 43 y.o. female here for a skin complaint. Due to COVID-19 pandemic, we are interacting via web portal for an electronic face-to-face visit. I verified patient's ID using 2 identifiers. Patient agreed to proceed with visit via this method. Patient is at home, I am at office. Patient and I are present for visit.    Duration: 1 week Location: L shoulder Pruritic? Yes Painful? No Drainage? No New soaps/lotions/topicals/detergents? No Sick contacts? No Other associated symptoms: gets it every time she works in yard; blistered over Therapies tried thus far: prednisone burst, Day 3 today  ROS:  Const: No fevers Skin: As noted in HPI  Past Medical History:  Diagnosis Date  . Anxiety    xanax but not taking due to breast feeding   . Beta thalassemia minor   . Chronic tonsillitis 09/2013   resolved  - has tonsillectomy  . Crohn's disease ([place])    no current med.  . Decreased fetal movement during pregnancy in third trimester, antepartum 08/07/2014  . GERD (gastroesophageal reflux disease)    occasional  . HSV (herpes simplex virus) anogenital infection   . Indigestion    occasional - protonix prn  . Irregular uterine contractions 08/07/2014  . SVD (spontaneous vaginal delivery)    x 3  . TMJ syndrome    right side   Exam No conversational dyspnea Age appropriate judgment and insight Nml affect and mood Pictures in chart- linear hyperpigmented lesion with vesicles noted  Rash - Plan: triamcinolone cream (KENALOG) 0.1 %, finish pred burst, OK to use cream after if having itching. If itching gets far worse, will need to do a longer taper. She will let us know.   Orders as above. F/u prn. The patient voiced understanding and agreement to the plan.  [place], DO 10/26/18 11:09 AM

## 2018-12-23 ENCOUNTER — Other Ambulatory Visit: Payer: Self-pay | Admitting: Family Medicine

## 2018-12-23 ENCOUNTER — Encounter: Payer: Self-pay | Admitting: Family Medicine

## 2018-12-23 MED ORDER — METRONIDAZOLE 500 MG PO TABS: 500.0000 mg | ORAL_TABLET | Freq: Two times a day (BID) | ORAL | 0 refills | Status: AC

## 2018-12-23 MED FILL — METRONIDAZOLE 500 MG TABS: 500 | 7 days supply | Qty: 14 | Fill #0

## 2019-01-15 ENCOUNTER — Encounter: Payer: Self-pay | Admitting: Family Medicine

## 2019-01-16 ENCOUNTER — Encounter: Payer: Self-pay | Admitting: Family Medicine

## 2019-01-19 ENCOUNTER — Other Ambulatory Visit: Payer: Self-pay | Admitting: Family Medicine

## 2019-01-19 DIAGNOSIS — N939 Abnormal uterine and vaginal bleeding, unspecified: Secondary | ICD-10-CM

## 2019-01-19 NOTE — Progress Notes (Signed)
am

## 2019-01-20 ENCOUNTER — Encounter: Payer: Self-pay | Admitting: Family Medicine

## 2019-01-27 ENCOUNTER — Other Ambulatory Visit: Payer: Self-pay

## 2019-01-27 ENCOUNTER — Ambulatory Visit (INDEPENDENT_AMBULATORY_CARE_PROVIDER_SITE_OTHER): Payer: No Typology Code available for payment source | Admitting: Family Medicine

## 2019-01-27 ENCOUNTER — Encounter: Payer: Self-pay | Admitting: Family Medicine

## 2019-01-27 VITALS — BP 108/70 | HR 84 | Temp 97.4°F | Resp 16 | Ht 63.0 in | Wt 127.7 lb

## 2019-01-27 DIAGNOSIS — Z0289 Encounter for other administrative examinations: Secondary | ICD-10-CM | POA: Diagnosis not present

## 2019-01-27 DIAGNOSIS — Z020 Encounter for examination for admission to educational institution: Secondary | ICD-10-CM | POA: Diagnosis not present

## 2019-01-27 NOTE — Patient Instructions (Addendum)
I recommend getting the flu shot in mid October. This suggestion would change if the CDC comes out with a different recommendation.   Let us know if you need anything.

## 2019-01-27 NOTE — Progress Notes (Signed)
CC: completion of form  Subjective: Patient is a 43 y.o. female here for form completion.  Patient is starting school to become a Designer, jewellery in around 6 weeks.  She needs a form filled out to do this.  She reports no physical or mental issues.  Taking medications routinely.  No recent travel or residence in a TB endemic area.  ROS: Const: no fevers  Past Medical History:  Diagnosis Date  . Anxiety    xanax but not taking due to breast feeding   . Beta thalassemia minor   . Chronic tonsillitis 09/2013   resolved  - has tonsillectomy  . Crohn's disease ([place])    no current med.  . Decreased fetal movement during pregnancy in third trimester, antepartum 08/07/2014  . GERD (gastroesophageal reflux disease)    occasional  . HSV (herpes simplex virus) anogenital infection   . Indigestion    occasional - protonix prn  . Irregular uterine contractions 08/07/2014  . SVD (spontaneous vaginal delivery)    x 3  . TMJ syndrome    right side    Objective: BP 108/70   Pulse 84   Temp (!) 97.4 F (36.3 C) (Temporal)   Resp 16   Ht 5' 3"  (1.6 m)   Wt 127 lb 11.2 oz (57.9 kg)   SpO2 98%   BMI 22.62 kg/m  General: Awake, appears stated age HEENT: MMM, EOMi, negative ears, nares patent without discharge Heart: RRR, no lower extremity edema Abdomen: Soft, nontender, nondistended, bowel sounds present Neuro: DTRs equal and symmetric, no clonus, no cerebellar signs, gait normal MSK: Strength 5/5 throughout, no asymmetry Lungs: CTAB, no rales, wheezes or rhonchi. No accessory muscle use Psych: Age appropriate judgment and insight, normal affect and mood  Assessment and Plan: Encounter for completion of form with patient  Form completed, should be no restrictions from completing any program. Follow-up as originally scheduled. The patient voiced understanding and agreement to the plan.  [place], DO 01/27/19  5:01 PM

## 2019-02-01 ENCOUNTER — Other Ambulatory Visit: Payer: Self-pay | Admitting: Family Medicine

## 2019-02-01 DIAGNOSIS — F410 Panic disorder [episodic paroxysmal anxiety] without agoraphobia: Secondary | ICD-10-CM

## 2019-02-01 MED FILL — ALPRAZolam 0.5 MG TABS: 0.5 | 30 days supply | Qty: 30 | Fill #0

## 2019-02-01 MED FILL — VALACYCLOVIR HCL 500 MG TAB: 500 | 30 days supply | Qty: 30 | Fill #0

## 2019-02-01 NOTE — Telephone Encounter (Signed)
Requesting:  Alprazolam Contract:  none UDS:   none Last Visit:   01/27/2019 Next Visit:   none Last Refill:    07/08/2018----#30 with 2 refills  Please Advise

## 2019-02-03 ENCOUNTER — Other Ambulatory Visit: Payer: Self-pay | Admitting: Obstetrics and Gynecology

## 2019-02-04 ENCOUNTER — Other Ambulatory Visit: Payer: Self-pay | Admitting: Obstetrics and Gynecology

## 2019-02-05 MED FILL — CETIRIZINE HCL 10 MG TABS: 10 | 100 days supply | Qty: 100 | Fill #2

## 2019-02-09 ENCOUNTER — Encounter: Payer: Self-pay | Admitting: Family Medicine

## 2019-02-10 ENCOUNTER — Other Ambulatory Visit: Payer: Self-pay | Admitting: Family Medicine

## 2019-02-10 DIAGNOSIS — R2232 Localized swelling, mass and lump, left upper limb: Secondary | ICD-10-CM

## 2019-02-10 NOTE — Progress Notes (Signed)
Diagnostic mammo

## 2019-02-14 ENCOUNTER — Encounter: Payer: Self-pay | Admitting: Family Medicine

## 2019-02-15 ENCOUNTER — Other Ambulatory Visit (HOSPITAL_COMMUNITY)
Admission: RE | Admit: 2019-02-15 | Discharge: 2019-02-15 | Disposition: A | Discharge status: Home / Self Care | Payer: No Typology Code available for payment source | Source: Ambulatory Visit | Attending: Obstetrics and Gynecology | Admitting: Obstetrics and Gynecology

## 2019-02-15 DIAGNOSIS — Z01812 Encounter for preprocedural laboratory examination: Secondary | ICD-10-CM | POA: Insufficient documentation

## 2019-02-15 DIAGNOSIS — Z20828 Contact with and (suspected) exposure to other viral communicable diseases: Secondary | ICD-10-CM | POA: Diagnosis not present

## 2019-02-16 ENCOUNTER — Other Ambulatory Visit: Payer: Self-pay

## 2019-02-16 ENCOUNTER — Encounter: Payer: Self-pay | Admitting: Family Medicine

## 2019-02-16 ENCOUNTER — Ambulatory Visit (INDEPENDENT_AMBULATORY_CARE_PROVIDER_SITE_OTHER): Payer: No Typology Code available for payment source | Admitting: Family Medicine

## 2019-02-16 DIAGNOSIS — F411 Generalized anxiety disorder: Secondary | ICD-10-CM | POA: Diagnosis not present

## 2019-02-16 LAB — NOVEL CORONAVIRUS, NAA (HOSP ORDER, SEND-OUT TO REF LAB; TAT 18-24 HRS): SARS-CoV-2, NAA: NOT DETECTED

## 2019-02-16 MED ORDER — SERTRALINE HCL 50 MG PO TABS: 50.0000 mg | ORAL_TABLET | Freq: Every day | ORAL | 3 refills | Status: DC

## 2019-02-16 MED FILL — SERTRALINE HCL 50 MG TABLET: 50 | 30 days supply | Qty: 30 | Fill #0
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
## 2019-02-17 ENCOUNTER — Encounter (HOSPITAL_BASED_OUTPATIENT_CLINIC_OR_DEPARTMENT_OTHER): Payer: Self-pay | Admitting: *Deleted

## 2019-02-17 ENCOUNTER — Other Ambulatory Visit: Payer: Self-pay

## 2019-02-17 NOTE — Anesthesia Preprocedure Evaluation (Addendum)
Anesthesia Evaluation  Patient identified by MRN, date of birth, ID band Patient awake    Reviewed: Allergy & Precautions, NPO status , Patient's Chart, lab work & pertinent test results  Airway Mallampati: II  TM Distance: >3 FB Neck ROM: Full    Dental no notable dental hx. (+) Teeth Intact, Dental Advisory Given   Pulmonary neg pulmonary ROS,    Pulmonary exam normal breath sounds clear to auscultation       Cardiovascular Exercise Tolerance: Good negative cardio ROS Normal cardiovascular exam Rhythm:Regular Rate:Normal     Neuro/Psych Anxiety negative neurological ROS     GI/Hepatic Neg liver ROS, GERD  ,  Endo/Other    Renal/GU negative Renal ROS     Musculoskeletal negative musculoskeletal ROS (+)   Abdominal   Peds  Hematology   Anesthesia Other Findings   Reproductive/Obstetrics negative OB ROS                            Anesthesia Physical Anesthesia Plan  ASA: I  Anesthesia Plan: General   Post-op Pain Management:    Induction: Intravenous  PONV Risk Score and Plan: Treatment may vary due to age or medical condition, Ondansetron, Dexamethasone and Midazolam  Airway Management Planned: LMA  Additional Equipment: None  Intra-op Plan:   Post-operative Plan:   Informed Consent: I have reviewed the patients History and Physical, chart, labs and discussed the procedure including the risks, benefits and alternatives for the proposed anesthesia with the patient or authorized representative who has indicated his/her understanding and acceptance.     Dental advisory given  Plan Discussed with: CRNA  Anesthesia Plan Comments:        Anesthesia Quick Evaluation

## 2019-02-17 NOTE — Progress Notes (Signed)
Spoke w/ via phone for pre-op interview--- PT Lab needs dos---- CBC, urine preg (anes. Order) COVID test ------ 02-15-2019 Arrive at ------- 0830 NPO after ------ MN Medications to take morning of surgery ----- NONE Diabetic medication ----- n/a Patient Special Instructions ----- n/a Pre-Op special Istructions ----- n/a Patient verbalized understanding of instructions that were given at this phone interview. Patient denies shortness of breath, chest pain, fever, cough a this phone interview.

## 2019-02-18 ENCOUNTER — Ambulatory Visit (HOSPITAL_BASED_OUTPATIENT_CLINIC_OR_DEPARTMENT_OTHER): Payer: No Typology Code available for payment source | Admitting: Anesthesiology

## 2019-02-18 ENCOUNTER — Ambulatory Visit (HOSPITAL_BASED_OUTPATIENT_CLINIC_OR_DEPARTMENT_OTHER)
Admission: RE | Admit: 2019-02-18 | Discharge: 2019-02-18 | Disposition: A | Discharge status: Home / Self Care | Payer: No Typology Code available for payment source | Attending: Obstetrics and Gynecology | Admitting: Obstetrics and Gynecology

## 2019-02-18 ENCOUNTER — Other Ambulatory Visit: Payer: Self-pay

## 2019-02-18 ENCOUNTER — Encounter (HOSPITAL_BASED_OUTPATIENT_CLINIC_OR_DEPARTMENT_OTHER)
Admission: RE | Disposition: A | Discharge status: Home / Self Care | Payer: Self-pay | Source: Home / Self Care | Attending: Obstetrics and Gynecology

## 2019-02-18 ENCOUNTER — Encounter (HOSPITAL_BASED_OUTPATIENT_CLINIC_OR_DEPARTMENT_OTHER): Payer: Self-pay

## 2019-02-18 DIAGNOSIS — N930 Postcoital and contact bleeding: Secondary | ICD-10-CM | POA: Diagnosis not present

## 2019-02-18 DIAGNOSIS — Z79899 Other long term (current) drug therapy: Secondary | ICD-10-CM | POA: Diagnosis not present

## 2019-02-18 DIAGNOSIS — R9389 Abnormal findings on diagnostic imaging of other specified body structures: Secondary | ICD-10-CM | POA: Diagnosis not present

## 2019-02-18 DIAGNOSIS — F411 Generalized anxiety disorder: Secondary | ICD-10-CM | POA: Diagnosis not present

## 2019-02-18 DIAGNOSIS — Z823 Family history of stroke: Secondary | ICD-10-CM | POA: Insufficient documentation

## 2019-02-18 DIAGNOSIS — N939 Abnormal uterine and vaginal bleeding, unspecified: Secondary | ICD-10-CM | POA: Insufficient documentation

## 2019-02-18 DIAGNOSIS — Z825 Family history of asthma and other chronic lower respiratory diseases: Secondary | ICD-10-CM | POA: Diagnosis not present

## 2019-02-18 DIAGNOSIS — D563 Thalassemia minor: Secondary | ICD-10-CM | POA: Diagnosis not present

## 2019-02-18 DIAGNOSIS — Z803 Family history of malignant neoplasm of breast: Secondary | ICD-10-CM | POA: Diagnosis not present

## 2019-02-18 DIAGNOSIS — N840 Polyp of corpus uteri: Secondary | ICD-10-CM | POA: Diagnosis not present

## 2019-02-18 DIAGNOSIS — Z9049 Acquired absence of other specified parts of digestive tract: Secondary | ICD-10-CM | POA: Diagnosis not present

## 2019-02-18 DIAGNOSIS — Z833 Family history of diabetes mellitus: Secondary | ICD-10-CM | POA: Diagnosis not present

## 2019-02-18 DIAGNOSIS — Z800 Family history of malignant neoplasm of digestive organs: Secondary | ICD-10-CM | POA: Insufficient documentation

## 2019-02-18 DIAGNOSIS — Z8249 Family history of ischemic heart disease and other diseases of the circulatory system: Secondary | ICD-10-CM | POA: Diagnosis not present

## 2019-02-18 HISTORY — DX: Presence of spectacles and contact lenses: Z97.3

## 2019-02-18 HISTORY — DX: Generalized anxiety disorder: F41.1

## 2019-02-18 HISTORY — DX: Abnormal uterine and vaginal bleeding, unspecified: N93.9

## 2019-02-18 HISTORY — DX: Other specified conditions associated with female genital organs and menstrual cycle: N94.89

## 2019-02-18 HISTORY — PX: DILATATION & CURETTAGE/HYSTEROSCOPY WITH MYOSURE: SHX6511

## 2019-02-18 LAB — POCT I-STAT, CHEM 8
BUN: 9 mg/dL (ref 6–20)
Calcium, Ion: 1.29 mmol/L (ref 1.15–1.40)
Chloride: 106 mmol/L (ref 98–111)
Creatinine, Ser: 0.7 mg/dL (ref 0.44–1.00)
Glucose, Bld: 86 mg/dL (ref 70–99)
HCT: 36 % (ref 36.0–46.0)
Hemoglobin: 12.2 g/dL (ref 12.0–15.0)
Potassium: 3.9 mmol/L (ref 3.5–5.1)
Sodium: 140 mmol/L (ref 135–145)
TCO2: 22 mmol/L (ref 22–32)

## 2019-02-18 LAB — POCT PREGNANCY, URINE: Preg Test, Ur: NEGATIVE

## 2019-02-18 SURGERY — DILATATION & CURETTAGE/HYSTEROSCOPY WITH MYOSURE
Anesthesia: General | Site: Vagina

## 2019-02-18 MED ORDER — ACETAMINOPHEN 500 MG PO TABS
ORAL_TABLET | ORAL | Status: AC
  Filled 2019-02-18: qty 2

## 2019-02-18 MED ORDER — DEXAMETHASONE SODIUM PHOSPHATE 10 MG/ML IJ SOLN
INTRAMUSCULAR | Status: DC | PRN
  Administered 2019-02-18: 5 mg via INTRAVENOUS

## 2019-02-18 MED ORDER — ONDANSETRON HCL 4 MG/2ML IJ SOLN
4.0000 mg | Freq: Once | INTRAMUSCULAR | Status: DC | PRN
  Filled 2019-02-18: qty 2

## 2019-02-18 MED ORDER — HYDROCODONE-ACETAMINOPHEN 5-325 MG PO TABS: 1.0000 | ORAL_TABLET | Freq: Four times a day (QID) | ORAL | 0 refills | Status: AC | PRN

## 2019-02-18 MED ORDER — OXYCODONE HCL 5 MG PO TABS
ORAL_TABLET | ORAL | Status: AC
  Filled 2019-02-18: qty 1

## 2019-02-18 MED ORDER — ACETAMINOPHEN 500 MG PO TABS
1000.0000 mg | ORAL_TABLET | Freq: Once | ORAL | Status: AC
  Administered 2019-02-18: 1000 mg via ORAL
  Filled 2019-02-18: qty 2

## 2019-02-18 MED ORDER — HYDROMORPHONE HCL 1 MG/ML IJ SOLN
0.2500 mg | INTRAMUSCULAR | Status: DC | PRN
  Filled 2019-02-18: qty 0.5

## 2019-02-18 MED ORDER — SODIUM CHLORIDE 0.9 % IR SOLN
Status: DC | PRN
  Administered 2019-02-18: 3000 mL

## 2019-02-18 MED ORDER — FENTANYL CITRATE (PF) 100 MCG/2ML IJ SOLN
INTRAMUSCULAR | Status: DC | PRN
  Administered 2019-02-18: 25 ug via INTRAVENOUS
  Administered 2019-02-18: 50 ug via INTRAVENOUS

## 2019-02-18 MED ORDER — LIDOCAINE 2% (20 MG/ML) 5 ML SYRINGE
INTRAMUSCULAR | Status: DC | PRN
  Administered 2019-02-18: 100 mg via INTRAVENOUS

## 2019-02-18 MED ORDER — SCOPOLAMINE 1 MG/3DAYS TD PT72
MEDICATED_PATCH | TRANSDERMAL | Status: AC
  Filled 2019-02-18: qty 1

## 2019-02-18 MED ORDER — PROPOFOL 10 MG/ML IV BOLUS
INTRAVENOUS | Status: DC | PRN
  Administered 2019-02-18: 50 mg via INTRAVENOUS
  Administered 2019-02-18: 100 mg via INTRAVENOUS

## 2019-02-18 MED ORDER — SCOPOLAMINE 1 MG/3DAYS TD PT72
MEDICATED_PATCH | TRANSDERMAL | Status: DC | PRN
  Administered 2019-02-18: 1 via TRANSDERMAL

## 2019-02-18 MED ORDER — PROPOFOL 10 MG/ML IV BOLUS
INTRAVENOUS | Status: AC
  Filled 2019-02-18: qty 40

## 2019-02-18 MED ORDER — LACTATED RINGERS IV SOLN
INTRAVENOUS | Status: DC
  Administered 2019-02-18: 50 mL/h via INTRAVENOUS
  Filled 2019-02-18: qty 1000

## 2019-02-18 MED ORDER — MEPERIDINE HCL 25 MG/ML IJ SOLN
6.2500 mg | INTRAMUSCULAR | Status: DC | PRN
  Filled 2019-02-18: qty 1

## 2019-02-18 MED ORDER — KETOROLAC TROMETHAMINE 30 MG/ML IJ SOLN
INTRAMUSCULAR | Status: DC | PRN
  Administered 2019-02-18: 30 mg via INTRAVENOUS

## 2019-02-18 MED ORDER — MIDAZOLAM HCL 2 MG/2ML IJ SOLN
INTRAMUSCULAR | Status: DC | PRN
  Administered 2019-02-18: 2 mg via INTRAVENOUS

## 2019-02-18 MED ORDER — FENTANYL CITRATE (PF) 100 MCG/2ML IJ SOLN
INTRAMUSCULAR | Status: AC
  Filled 2019-02-18: qty 2

## 2019-02-18 MED ORDER — DEXMEDETOMIDINE HCL IN NACL 400 MCG/100ML IV SOLN
INTRAVENOUS | Status: DC | PRN
  Administered 2019-02-18: 10 ug via INTRAVENOUS

## 2019-02-18 MED ORDER — MIDAZOLAM HCL 2 MG/2ML IJ SOLN
INTRAMUSCULAR | Status: AC
  Filled 2019-02-18: qty 2

## 2019-02-18 MED ORDER — OXYCODONE HCL 5 MG PO TABS
5.0000 mg | ORAL_TABLET | Freq: Once | ORAL | Status: AC | PRN
  Administered 2019-02-18: 5 mg via ORAL
  Filled 2019-02-18: qty 1

## 2019-02-18 MED ORDER — IBUPROFEN 800 MG PO TABS: 800.0000 mg | ORAL_TABLET | Freq: Three times a day (TID) | ORAL | 5 refills | Status: DC | PRN

## 2019-02-18 MED ORDER — OXYCODONE HCL 5 MG/5ML PO SOLN
5.0000 mg | Freq: Once | ORAL | Status: AC | PRN
  Filled 2019-02-18: qty 5

## 2019-02-18 MED ORDER — KETOROLAC TROMETHAMINE 60 MG/2ML IM SOLN
INTRAMUSCULAR | Status: DC | PRN
  Administered 2019-02-18: 30 mg via INTRAMUSCULAR

## 2019-02-18 MED ORDER — ONDANSETRON HCL 4 MG/2ML IJ SOLN
INTRAMUSCULAR | Status: DC | PRN
  Administered 2019-02-18: 4 mg via INTRAVENOUS

## 2019-02-18 MED FILL — IBUPROFEN 800 MG TAB: 800 | 10 days supply | Qty: 30 | Fill #0

## 2019-02-18 MED FILL — HYDROCODON-APAP 5-325: 5-325 | 5 days supply | Qty: 20 | Fill #0

## 2019-02-18 SURGICAL SUPPLY — 29 items
BIPOLAR CUTTING LOOP 21FR (ELECTRODE)
CANISTER SUCT 3000ML PPV (MISCELLANEOUS) ×2 IMPLANT
CATH ROBINSON RED A/P 16FR (CATHETERS) ×1 IMPLANT
COVER WAND RF STERILE (DRAPES) ×2 IMPLANT
DEVICE MYOSURE LITE (MISCELLANEOUS) ×1 IMPLANT
DEVICE MYOSURE REACH (MISCELLANEOUS) IMPLANT
DILATOR CANAL MILEX (MISCELLANEOUS) IMPLANT
ELECT BLADE TIP CTD 4 INCH (ELECTRODE) ×1 IMPLANT
ELECT REM PT RETURN 9FT ADLT (ELECTROSURGICAL) ×2
ELECTRODE REM PT RTRN 9FT ADLT (ELECTROSURGICAL) IMPLANT
GAUZE 4X4 16PLY RFD (DISPOSABLE) ×2 IMPLANT
GLOVE BIOGEL PI IND STRL 7.0 (GLOVE) ×1 IMPLANT
GLOVE BIOGEL PI INDICATOR 7.0 (GLOVE) ×1
GLOVE ECLIPSE 6.5 STRL STRAW (GLOVE) ×2 IMPLANT
GOWN STRL REUS W/TWL LRG LVL3 (GOWN DISPOSABLE) ×2 IMPLANT
IV NS IRRIG 3000ML ARTHROMATIC (IV SOLUTION) ×2 IMPLANT
KIT PROCEDURE FLUENT (KITS) ×2 IMPLANT
KIT TURNOVER CYSTO (KITS) ×2 IMPLANT
LOOP CUTTING BIPOLAR 21FR (ELECTRODE) IMPLANT
MYOSURE XL FIBROID (MISCELLANEOUS)
PACK VAGINAL MINOR WOMEN LF (CUSTOM PROCEDURE TRAY) ×2 IMPLANT
PAD OB MATERNITY 4.3X12.25 (PERSONAL CARE ITEMS) ×2 IMPLANT
PAD PREP 24X48 CUFFED NSTRL (MISCELLANEOUS) ×2 IMPLANT
PENCIL BUTTON HOLSTER BLD 10FT (ELECTRODE) ×1 IMPLANT
SEAL CERVICAL OMNI LOK (ABLATOR) IMPLANT
SEAL ROD LENS SCOPE MYOSURE (ABLATOR) ×2 IMPLANT
SYSTEM TISS REMOVAL MYOSURE XL (MISCELLANEOUS) IMPLANT
TOWEL OR 17X26 10 PK STRL BLUE (TOWEL DISPOSABLE) ×2 IMPLANT
WATER STERILE IRR 500ML POUR (IV SOLUTION) ×2 IMPLANT

## 2019-02-18 NOTE — Discharge Instructions (Signed)

## 2019-02-18 NOTE — Transfer of Care (Signed)
Immediate Anesthesia Transfer of Care Note  Patient: Susan Montoya  Procedure(s) Performed: DILATATION & CURETTAGE/HYSTEROSCOPY WITH MYOSURE (N/A Vagina )  Patient Location: PACU  Anesthesia Type:General  Level of Consciousness: awake, alert , oriented and patient cooperative  Airway & Oxygen Therapy: Patient Spontanous Breathing and Patient connected to nasal cannula oxygen  Post-op Assessment: Report given to RN and Post -op Vital signs reviewed and stable  Post vital signs: Reviewed and stable  Last Vitals:  Vitals Value Taken Time  BP    Temp    Pulse 70 02/18/19 1122  Resp 14 02/18/19 1122  SpO2 100 % 02/18/19 1122  Vitals shown include unvalidated device data.  Last Pain:  Vitals:   02/18/19 0907  TempSrc: Oral  PainSc: 0-No pain      Patients Stated Pain Goal: 6 (52/58/94 8347)  Complications: No apparent anesthesia complications

## 2019-02-18 NOTE — H&P (Addendum)
Susan Montoya is an 43 y.o. female present for surgical mgmt of DUB. Pt was found on sono hysterogram to have endometrial thickening with suggestion of multiple polyps. Pt has also had postcoital bleeding  Pertinent Gynecological History: Menses: regular every month with spotting approximately 5 days per month Bleeding: dysfunctional uterine bleeding Contraception: tubal ligation DES exposure: denies Blood transfusions: none Sexually transmitted diseases: no past history Previous GYN Procedures: TL  Last mammogram: normal Date: 01/15/2019 Last pap: normal Date: 01/15/2019 OB History: G3, P3   Menstrual History: Menarche age: n/a Patient's last menstrual period was 02/09/2019.    Past Medical History:  Diagnosis Date  . Abnormal uterine bleeding (AUB)   . Beta thalassemia minor    02-17-2019  per pt dx as child at age 44, was tested due to family history  . Crohn's disease ([place]) GI--- dr Collene Mares   no current med.----   s/p  colon resection 07/ 2007  . Endometrial mass   . GAD (generalized anxiety disorder)   . 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 TMJ syndrome    right side  (02-17-2019  per pt has had not issues in awhile)  . Wears glasses     Past Surgical History:  Procedure Laterality Date  . COLON RESECTION  07/ 2007--- age 3   terminal ileum and right colon w/ appendix  . COLONOSCOPY WITH PROPOFOL  last one 05-09-2010  . LAPAROSCOPIC TUBAL LIGATION Bilateral 11/07/2014   Procedure: LAPAROSCOPIC  BILATERAL TUBAL LIGATION with cautery and fulgeration of endmetrios;  Surgeon: Servando Salina, MD;  Location: [facility];  Service: Gynecology;  Laterality: Bilateral;  . TONSILLECTOMY N/A 10/08/2013   Procedure: TONSILLECTOMY;  Surgeon: Rozetta Nunnery, MD;  Location: [facility];  Service: ENT;  Laterality: N/A;  . WISDOM TOOTH EXTRACTION      Family History  Problem Relation Age of Onset  .  Cancer Mother        breast  . Hyperlipidemia Mother   . Hypertension Mother   . Allergic rhinitis Mother   . Hypertension Father   . [place] syndrome Father   . Heart disease Father   . Hypertension Brother   . Asthma Brother   . Hypertension Maternal Grandmother   . Anemia Maternal Grandmother        beta thalasemia minor  . Hyperlipidemia Maternal Grandfather   . Hypertension Maternal Grandfather   . Cancer Maternal Grandfather        colon  . Heart disease Maternal Grandfather   . Stroke Maternal Grandfather   . Diabetes Paternal Grandmother   . Cancer Paternal Grandmother        breast  . Diabetes Paternal Grandfather   . Allergic rhinitis Son   . Angioedema Neg Hx   . Eczema Neg Hx   . Immunodeficiency Neg Hx   . Urticaria Neg Hx     Social History:  reports that she has never smoked. She has never used smokeless tobacco. She reports current alcohol use of about 1.0 - 2.0 standard drinks of alcohol per week. She reports that she does not use drugs.  Allergies: No Known Allergies  Medications Prior to Admission  Medication Sig Dispense Refill Last Dose  . acetaminophen (TYLENOL) 325 MG tablet Take 650 mg by mouth every 6 (six) hours as needed for mild pain.     Marland Kitchen acyclovir cream (ZOVIRAX) 5 %  Apply 5 times daily for 4 days during flares. 5 g 2   . ALPRAZolam (XANAX) 0.5 MG tablet TAKE 1/2 TO 1 TABLET BY MOUTH DAILY AS NEEDED. 30 tablet 2   . cetirizine (ZYRTEC) 10 MG tablet Take 1 tablet (10 mg total) by mouth daily. (Patient taking differently: Take 10 mg by mouth daily as needed. ) 90 tablet 3   . folic acid (FOLVITE) 1 MG tablet Take 1 tablet (1 mg total) by mouth daily. 90 tablet 3   . guaiFENesin (MUCINEX) 600 MG 12 hr tablet Take 1 tablet (600 mg total) by mouth 2 (two) times daily. 60 tablet 2   . Probiotic Product (PROBIOTIC PO) Take 1 tablet by mouth once a week.      . rizatriptan (MAXALT) 10 MG tablet Take 1 tablet (10 mg total) by mouth as  needed for migraine. May repeat in 2 hours if needed 10 tablet 0   . valACYclovir (VALTREX) 500 MG tablet TAKE 1 TABLET BY MOUTH ONCE DAILY (Patient taking differently: Take 500 mg by mouth daily as needed. ) 30 tablet 3   . sertraline (ZOLOFT) 50 MG tablet Take 1 tablet (50 mg total) by mouth daily. Take 1/2 tab daily for first 2 weeks. (Patient not taking: Reported on 02/17/2019) 30 tablet 3 Not Taking at Unknown time  . triamcinolone cream (KENALOG) 0.1 % Apply 1 application topically 2 (two) times daily. (Patient not taking: Reported on 02/17/2019) 30 g 0 Not Taking at Unknown time    Review of Systems  All other systems reviewed and are negative.   Height 5' 3"  (1.6 m), weight 56.7 kg, last menstrual period 02/09/2019. Physical Exam  Constitutional: She is oriented to person, place, and time. She appears well-developed and well-nourished.  HENT:  Head: Atraumatic.  Eyes: EOM are normal.  Neck: Neck supple.  Cardiovascular: Regular rhythm.  Respiratory: Breath sounds normal.  GI: Soft. Bowel sounds are normal.  Genitourinary:    Vagina and uterus normal.     Genitourinary Comments: Cervix parous Adnexa nl   Neurological: She is alert and oriented to person, place, and time.  Skin: Skin is warm and dry.    Results for orders placed or performed during the hospital encounter of 02/18/19 (from the past 24 hour(s))  Pregnancy, urine POC     Status: None   Collection Time: 02/18/19  8:40 AM  Result Value Ref Range   Preg Test, Ur NEGATIVE NEGATIVE    No results found.  Assessment/Plan: DUB Endometrial thickening on sonogram P) dx hysteroscopy, D&C, resection of endometrial mass if present. Risk of surgery reviewed including infection, bleeding, uterine perforation and its risk, internal scar tissue, injury to surrounding organ structures, thermal injury, fluid overload and its risk. All ? answered  [name] 02/18/2019, 8:53 AM    Addendum: no change since last  visit I have re-examined pt

## 2019-02-18 NOTE — Brief Op Note (Signed)
02/18/2019  11:14 AM  PATIENT:  Susan Montoya  43 y.o. female  PRE-OPERATIVE DIAGNOSIS:  DUB, endometrial mass, postcoital bleeding  POST-OPERATIVE DIAGNOSIS: DUB, endometrial polyp, postcoital bleeding  PROCEDURE:  Diagnostic hysteroscopy, hysteroscopic resection of endometrial polyp, D&C  SURGEON:  Surgeon(s) and Role:    * Servando Salina, MD - Primary  PHYSICIAN ASSISTANT:   ASSISTANTS: none   ANESTHESIA:   general  EBL:  5 mL   BLOOD ADMINISTERED:none  DRAINS: none   LOCAL MEDICATIONS USED:  NONE  SPECIMEN:  Source of Specimen:  emc with endometrial polyp  DISPOSITION OF SPECIMEN:  PATHOLOGY  COUNTS:  YES  TOURNIQUET:  * No tourniquets in log *  DICTATION: .Other Dictation: Dictation Number [id number]  PLAN OF CARE: Discharge to home after PACU  PATIENT DISPOSITION:  PACU - hemodynamically stable.   Delay start of Pharmacological VTE agent (>24hrs) due to surgical blood loss or risk of bleeding: no

## 2019-02-18 NOTE — Anesthesia Procedure Notes (Signed)
Procedure Name: LMA Insertion Date/Time: 02/18/2019 10:34 AM Performed by: Wanita Chamberlain, CRNA Pre-anesthesia Checklist: Patient identified, Emergency Drugs available, Suction available and Patient being monitored Patient Re-evaluated:Patient Re-evaluated prior to induction Oxygen Delivery Method: Circle system utilized Preoxygenation: Pre-oxygenation with 100% oxygen Induction Type: IV induction Ventilation: Mask ventilation with difficulty LMA: LMA inserted LMA Size: 3.0 Placement Confirmation: positive ETCO2,  CO2 detector and breath sounds checked- equal and bilateral Tube secured with: Tape Dental Injury: Teeth and Oropharynx as per pre-operative assessment

## 2019-02-18 NOTE — Anesthesia Postprocedure Evaluation (Signed)
Anesthesia Post Note  Patient: Susan Montoya  Procedure(s) Performed: DILATATION & CURETTAGE/HYSTEROSCOPY WITH MYOSURE (N/A Vagina )     Patient location during evaluation: PACU Anesthesia Type: General Level of consciousness: awake and alert Pain management: pain level controlled Vital Signs Assessment: post-procedure vital signs reviewed and stable Respiratory status: spontaneous breathing, nonlabored ventilation, respiratory function stable and patient connected to nasal cannula oxygen Cardiovascular status: blood pressure returned to baseline and stable Postop Assessment: no apparent nausea or vomiting Anesthetic complications: no    Last Vitals:  Vitals:   02/18/19 1157 02/18/19 1235  BP:  112/72  Pulse: 69 61  Resp: 19 16  Temp:  (!) 36.4 C  SpO2: 100% 100%    Last Pain:  Vitals:   02/18/19 1235  TempSrc: Axillary  PainSc:                  Barnet Glasgow

## 2019-02-19 ENCOUNTER — Encounter (HOSPITAL_BASED_OUTPATIENT_CLINIC_OR_DEPARTMENT_OTHER): Payer: Self-pay | Admitting: Obstetrics and Gynecology

## 2019-02-19 LAB — SURGICAL PATHOLOGY

## 2019-02-19 NOTE — Op Note (Signed)
NAMETERE, MCCONAUGHEY MEDICAL RECORD [id number] ACCOUNT [id number] DATE OF BIRTH:10-25-1975 FACILITY: [facility] LOCATION: [facility] PHYSICIAN:Khia Dieterich A. Rynn Markiewicz, MD  OPERATIVE REPORT  DATE OF PROCEDURE:  02/18/2019  PREOPERATIVE DIAGNOSES:  Dysfunctional uterine bleeding, endometrial thickening on sonogram, endometrial mass, postcoital bleeding.  PROCEDURE PERFORMED:  Diagnostic hysteroscopy, hysteroscopic resection of endometrial polyp, dilation and curettage.  POSTOPERATIVE DIAGNOSIS:  Dysfunctional uterine bleeding, endometrial thickening on sonogram, endometrial mass, postcoital bleeding.  ANESTHESIA:  General.  SURGEON:  Servando Salina, MD  ASSISTANT:  None.  DESCRIPTION OF PROCEDURE:  Under adequate general anesthesia, the patient was placed in the dorsal lithotomy position.  Bladder was catheterized a large amount of urine.  Examination under anesthesia revealed an anteverted uterus.  No adnexal masses.  A  bivalve speculum was placed in the vagina.  A single-tooth tenaculum was placed on the anterior lip of the cervix.  The cervix was easily dilated up to a #19 Pratt dilator.  A diagnostic hysteroscope was introduced into the uterine cavity.  Both tubal  ostia could be seen.  Endometrial cavity throughout was thickened, as well as there was a polypoid lesion in the right lower uterine segment.  Using the LITE resectoscope, the entire cavity was resected, including the polyp.  Once the cavity was resected, the endocervical canal was inspected.  No lesions noted.  The resectoscope was removed.  The end of the cervix had viable tissue, and using the cautery, the endocervix was cauterized.  All instruments were then removed from the vagina.  SPECIMEN:  Endometrial curettings with polyp sent to pathology.  ESTIMATED BLOOD LOSS: 10 mL.  FLUID DEFICIT:  500 mL.  COMPLICATIONS:  None.  DISPOSITION:  The patient tolerated the procedure well and was transferred to recovery room  in stable condition.  LN/NUANCE  D:02/18/2019 T:02/19/2019 JOB:[id number]

## 2019-03-18 ENCOUNTER — Encounter: Payer: Self-pay | Admitting: Family Medicine

## 2019-03-20 ENCOUNTER — Other Ambulatory Visit: Payer: Self-pay | Admitting: Family Medicine

## 2019-03-20 DIAGNOSIS — R519 Headache, unspecified: Secondary | ICD-10-CM

## 2019-03-22 MED ORDER — RIZATRIPTAN BENZOATE 10 MG PO TABS: 10.0000 mg | ORAL_TABLET | ORAL | 0 refills | Status: DC | PRN

## 2019-03-24 ENCOUNTER — Other Ambulatory Visit: Payer: Self-pay | Admitting: Family Medicine

## 2019-03-24 ENCOUNTER — Encounter: Payer: Self-pay | Admitting: Family Medicine

## 2019-04-01 ENCOUNTER — Telehealth: Payer: No Typology Code available for payment source | Admitting: Physician Assistant

## 2019-04-01 DIAGNOSIS — R300 Dysuria: Secondary | ICD-10-CM | POA: Diagnosis not present

## 2019-04-01 MED ORDER — PHENAZOPYRIDINE HCL 97.2 MG PO TABS: 97.0000 mg | ORAL_TABLET | Freq: Three times a day (TID) | ORAL | 0 refills | Status: DC | PRN

## 2019-04-01 MED ORDER — NITROFURANTOIN MONOHYD MACRO 100 MG PO CAPS: 100.0000 mg | ORAL_CAPSULE | Freq: Two times a day (BID) | ORAL | 0 refills | Status: AC

## 2019-04-01 MED FILL — NITROFURANTOIN MONO-MCR 100: 100 | 5 days supply | Qty: 10 | Fill #0

## 2019-04-01 NOTE — Progress Notes (Signed)
We are sorry that you are not feeling well.  Here is how we plan to help!  Based on what you shared with me it looks like you most likely have a simple urinary tract infection.  A UTI (Urinary Tract Infection) is a bacterial infection of the bladder.  Most cases of urinary tract infections are simple to treat but a key part of your care is to encourage you to drink plenty of fluids and watch your symptoms carefully.  I have prescribed MacroBid 100 mg twice a day for 5 days. I have also prescribed Azo for the burning sensation.  Your symptoms should gradually improve. Call us if the burning in your urine worsens, you develop worsening fever, back pain or pelvic pain or if your symptoms do not resolve after completing the antibiotic.  Urinary tract infections can be prevented by drinking plenty of water to keep your body hydrated.  Also be sure when you wipe, wipe from front to back and don't hold it in!  If possible, empty your bladder every 4 hours.  Your e-visit answers were reviewed by a board certified advanced clinical practitioner to complete your personal care plan.  Depending on the condition, your plan could have included both over the counter or prescription medications.  If there is a problem please reply  once you have received a response from your provider.  Your safety is important to [place].  If you have drug allergies check your prescription carefully.    You can use MyChart to ask questions about today's visit, request a non-urgent call back, or ask for a work or school excuse for 24 hours related to this e-Visit. If it has been greater than 24 hours you will need to follow up with your provider, or enter a new e-Visit to address those concerns.   You will get an e-mail in the next two days asking about your experience.  I hope that your e-visit has been valuable and will speed your recovery. Thank you for using e-visits.   Greater than 5 minutes, yet less than 10 minutes of time  have been spent researching, coordinating, and implementing care for this patient today.

## 2019-04-02 MED FILL — RIZATRIPTAN BENZOATE 10 MG: 10 | 30 days supply | Qty: 10 | Fill #0

## 2019-04-16 ENCOUNTER — Encounter: Payer: Self-pay | Admitting: Family Medicine

## 2019-04-16 ENCOUNTER — Ambulatory Visit (INDEPENDENT_AMBULATORY_CARE_PROVIDER_SITE_OTHER): Payer: No Typology Code available for payment source | Admitting: Family Medicine

## 2019-04-16 VITALS — Temp 97.3°F

## 2019-04-16 DIAGNOSIS — J0101 Acute recurrent maxillary sinusitis: Secondary | ICD-10-CM

## 2019-04-16 DIAGNOSIS — F411 Generalized anxiety disorder: Secondary | ICD-10-CM

## 2019-04-16 MED ORDER — PREDNISONE 20 MG PO TABS: 40.0000 mg | ORAL_TABLET | Freq: Every day | ORAL | 0 refills | Status: AC

## 2019-04-16 MED FILL — ALPRAZolam 0.5 MG TABS: 0.5 | 30 days supply | Qty: 30 | Fill #1

## 2019-04-16 MED FILL — predniSONE 20 MG TABS: 20 | 5 days supply | Qty: 10 | Fill #0

## 2019-04-16 NOTE — Progress Notes (Signed)
Chief Complaint  Patient presents with  . Sinusitis    Still having some dysuria since finishing antibiotics    Susan Montoya here for URI complaints. Due to COVID-19 pandemic, we are interacting via web portal for an electronic face-to-face visit. I verified patient's ID using 2 identifiers. Patient agreed to proceed with visit via this method. Patient is at home, I am at office. Patient and I are present for visit.   Duration: 8 days  Associated symptoms: sinus congestion, sinus pain, rhinorrhea and slight cough Denies: itchy watery eyes, ear pain, ear drainage, sore throat, wheezing, shortness of breath, myalgia and fevers Treatment to date: Zyrtec Mucinex, Flonase, Vick's, Tessalon Perles Sick contacts: No  Gets around the same time yearly.  Hx of situational anxiety. Her main trigger has wrapped up a week ago. She is taking Xanax prn. No daily med. Did ok with Effexor, stopped Zoloft. She is following with a counselor but questions its efficacy at this time. Exercise has dropped.   ROS:  Const: Denies fevers HEENT: As noted in HPI Lungs: No SOB  Past Medical History:  Diagnosis Date  . Abnormal uterine bleeding (AUB)   . Beta thalassemia minor    02-17-2019  per pt dx as child at age 82, was tested due to family history  . Crohn's disease ([place]) GI--- dr Collene Mares   no current med.----   s/p  colon resection 07/ 2007  . Endometrial mass   . GAD (generalized anxiety disorder)   . 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 TMJ syndrome    right side  (02-17-2019  per pt has had not issues in awhile)  . Wears glasses    Exam Temp (!) 97.3 F (36.3 C) (Oral)  No conversational dyspnea Age appropriate judgment and insight Nml affect and mood  Acute recurrent maxillary sinusitis - Plan: predniSONE (DELTASONE) 20 MG tablet  Anxiety state  1- Pred burst. If no improvement, will rx abx. Supportive care. Likely allergic  given recurring nature. 2- Cont prn Xanax. If no improvement, she will send message and we will restart SNRI. Cont w counselor for now. Counseled on exercise. F/u as originally scheduled.  Pt voiced understanding and agreement to the plan.  [place], DO 04/17/19 8:15 AM

## 2019-04-20 ENCOUNTER — Other Ambulatory Visit: Payer: Self-pay | Admitting: Family Medicine

## 2019-04-20 ENCOUNTER — Encounter: Payer: Self-pay | Admitting: Family Medicine

## 2019-04-20 DIAGNOSIS — R300 Dysuria: Secondary | ICD-10-CM

## 2019-07-26 ENCOUNTER — Encounter: Payer: Self-pay | Admitting: Family Medicine

## 2019-07-26 ENCOUNTER — Ambulatory Visit (INDEPENDENT_AMBULATORY_CARE_PROVIDER_SITE_OTHER): Payer: No Typology Code available for payment source | Admitting: Family Medicine

## 2019-07-26 DIAGNOSIS — Z0289 Encounter for other administrative examinations: Secondary | ICD-10-CM

## 2019-07-26 DIAGNOSIS — F411 Generalized anxiety disorder: Secondary | ICD-10-CM | POA: Diagnosis not present

## 2019-07-26 NOTE — Progress Notes (Signed)
Chief Complaint  Patient presents with  . Follow-up    letter of medical necessity for Gym Membership    Subjective: Patient is a 44 y.o. female here for f/u. Due to COVID-19 pandemic, we are interacting via web portal for an electronic face-to-face visit. I verified patient's ID using 2 identifiers. Patient agreed to proceed with visit via this method. Patient is in a car, I am at office. Patient and I are present for visit.   Pt w hx of GAD. She is seeing a Social worker, has tried a myriad of medicines with little relief. She has been told to exercise and does find this is helpful. Needs a letter to justify this.   Past Medical History:  Diagnosis Date  . Abnormal uterine bleeding (AUB)   . Beta thalassemia minor    02-17-2019  per pt dx as child at age 80, was tested due to family history  . Crohn's disease ([place]) GI--- dr Collene Mares   no current med.----   s/p  colon resection 07/ 2007  . Endometrial mass   . GAD (generalized anxiety disorder)   . 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 TMJ syndrome    right side  (02-17-2019  per pt has had not issues in awhile)  . Wears glasses     Objective: No conversational dyspnea Age appropriate judgment and insight Nml affect and mood  Assessment and Plan: Anxiety state  Will write letter of med necessity. F/u as originally scheduled.  The patient voiced understanding and agreement to the plan.  [place], DO 07/26/19  4:00 PM

## 2019-09-08 ENCOUNTER — Ambulatory Visit: Payer: No Typology Code available for payment source | Attending: Internal Medicine

## 2019-09-08 ENCOUNTER — Telehealth: Payer: Self-pay | Admitting: Family Medicine

## 2019-09-08 NOTE — Telephone Encounter (Signed)
Pt states she was charge a no show fee for dos  (07/26/19).  Pt states she was seeing virtually.  Patient phone number is  [phone].   Thank You

## 2019-09-15 NOTE — Telephone Encounter (Signed)
Spoke with patient to let her know I am sending an email to [employer] to ask them to remove the No Show Fee because patient was seen for a virtual visit on 07/26/19 by Dr. Nani Ravens.  I also updated the end of day statistic to mark visit as "completed" on patient's appointment desk.

## 2019-09-28 ENCOUNTER — Other Ambulatory Visit: Payer: Self-pay | Admitting: Family Medicine

## 2019-09-28 ENCOUNTER — Other Ambulatory Visit: Payer: Self-pay

## 2019-09-28 DIAGNOSIS — F410 Panic disorder [episodic paroxysmal anxiety] without agoraphobia: Secondary | ICD-10-CM

## 2019-09-28 DIAGNOSIS — R519 Headache, unspecified: Secondary | ICD-10-CM

## 2019-09-28 MED ORDER — RIZATRIPTAN BENZOATE 10 MG PO TABS: 10.0000 mg | ORAL_TABLET | ORAL | 2 refills | Status: DC | PRN

## 2019-09-28 MED FILL — ALPRAZolam 0.5 MG TABS: 0.5 | 30 days supply | Qty: 30 | Fill #0

## 2019-09-28 MED FILL — RIZATRIPTAN BENZOATE 10 MG: 10 | 30 days supply | Qty: 12 | Fill #0

## 2019-09-28 MED FILL — VALACYCLOVIR HCL 500 MG TAB: 500 | 30 days supply | Qty: 30 | Fill #1

## 2019-09-28 NOTE — Telephone Encounter (Signed)
Last Refill  #30 with 2 refills on 02/01/2019 Last OV was on 07/26/2019 No UDS/No CSC

## 2019-11-15 ENCOUNTER — Other Ambulatory Visit: Payer: Self-pay | Admitting: Family Medicine

## 2019-11-15 DIAGNOSIS — Z1283 Encounter for screening for malignant neoplasm of skin: Secondary | ICD-10-CM

## 2019-11-15 DIAGNOSIS — M549 Dorsalgia, unspecified: Secondary | ICD-10-CM

## 2019-11-18 ENCOUNTER — Ambulatory Visit: Payer: No Typology Code available for payment source | Admitting: Family Medicine

## 2019-12-13 MED FILL — ALPRAZolam 0.5 MG TABS: 0.5 | 30 days supply | Qty: 30 | Fill #1

## 2019-12-17 ENCOUNTER — Ambulatory Visit (INDEPENDENT_AMBULATORY_CARE_PROVIDER_SITE_OTHER): Payer: No Typology Code available for payment source

## 2019-12-17 ENCOUNTER — Encounter: Payer: Self-pay | Admitting: Family Medicine

## 2019-12-17 ENCOUNTER — Ambulatory Visit (INDEPENDENT_AMBULATORY_CARE_PROVIDER_SITE_OTHER): Payer: No Typology Code available for payment source | Admitting: Family Medicine

## 2019-12-17 ENCOUNTER — Other Ambulatory Visit: Payer: Self-pay

## 2019-12-17 VITALS — BP 104/70 | HR 70 | Ht 63.0 in | Wt 132.0 lb

## 2019-12-17 DIAGNOSIS — M542 Cervicalgia: Secondary | ICD-10-CM

## 2019-12-17 DIAGNOSIS — M545 Low back pain, unspecified: Secondary | ICD-10-CM

## 2019-12-17 DIAGNOSIS — G8929 Other chronic pain: Secondary | ICD-10-CM

## 2019-12-17 DIAGNOSIS — M24559 Contracture, unspecified hip: Secondary | ICD-10-CM | POA: Diagnosis not present

## 2019-12-17 DIAGNOSIS — M999 Biomechanical lesion, unspecified: Secondary | ICD-10-CM | POA: Diagnosis not present

## 2019-12-17 MED ORDER — VITAMIN D (ERGOCALCIFEROL) 1.25 MG (50000 UNIT) PO CAPS: 50000.0000 [IU] | ORAL_CAPSULE | ORAL | 0 refills | Status: DC

## 2019-12-17 MED FILL — VIT D2 1.25 MG (50,000 UNIT: 1.25 MG | 84 days supply | Qty: 12 | Fill #0

## 2019-12-17 NOTE — Assessment & Plan Note (Signed)
Bilateral overall.  Likely secondary to this and possibly some mild anxiety.  Patient has had microcytic anemia and Crohn's disease that is concerning for more of a vitamin D deficiency and put on once weekly the left with muscle strength endurance.  Discussed with patient that icing regimen, increase activity slowly.  Responded well to manipulation.  Follow-up with me again 6 weeks

## 2019-12-17 NOTE — Progress Notes (Signed)
[facility] [address] [place] Phone: [phone] Subjective:   I Susan Montoya am serving as a Education administrator for Dr. Hulan Saas.  This visit occurred during the SARS-CoV-2 public health emergency.  Safety protocols were in place, including screening questions prior to the visit, additional usage of staff PPE, and extensive cleaning of exam room while observing appropriate contact time as indicated for disinfecting solutions.   I'm seeing this patient by the request  of:  Shelda Pal, DO  CC: back pain   [id number]  Susan Montoya is a 44 y.o. female coming in with complaint of back pain. 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 Injured her shoulder Sunday and was on light duty. On Monday she could barely move her arm. Also having neck pain as well. States she used to run half marathons but due to covid she hasn't been able to run as much. Tingling radiating down the arm with certain movements. Using a rolling bookbag instead of a back pack for school.   Onset- Chronic Location - low back, neck  Character- stiff  Aggravating factors- sitting, working    Therapies tried- Topical CBD, heat  Severity- 5/10 at its worse      Past Medical History:  Diagnosis Date  . Abnormal uterine bleeding (AUB)   . Beta thalassemia minor    02-17-2019  per pt dx as child at age 87, was tested due to family history  . Crohn's disease ([place]) GI--- dr Collene Mares   no current med.----   s/p  colon resection 07/ 2007  . Endometrial mass   . GAD (generalized anxiety disorder)   . 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 TMJ syndrome    right side  (02-17-2019  per pt has had not issues in awhile)  . Wears glasses    Past Surgical History:  Procedure Laterality Date  . COLON RESECTION  07/ 2007--- age 67   terminal ileum and  right colon w/ appendix  . COLONOSCOPY WITH PROPOFOL  last one 05-09-2010  . DILATATION & CURETTAGE/HYSTEROSCOPY WITH MYOSURE N/A 02/18/2019   Procedure: DILATATION & CURETTAGE/HYSTEROSCOPY WITH MYOSURE;  Surgeon: Servando Salina, MD;  Location: [facility];  Service: Gynecology;  Laterality: N/A;  . LAPAROSCOPIC TUBAL LIGATION Bilateral 11/07/2014   Procedure: LAPAROSCOPIC  BILATERAL TUBAL LIGATION with cautery and fulgeration of endmetrios;  Surgeon: Servando Salina, MD;  Location: [facility];  Service: Gynecology;  Laterality: Bilateral;  . TONSILLECTOMY N/A 10/08/2013   Procedure: TONSILLECTOMY;  Surgeon: Rozetta Nunnery, MD;  Location: [facility];  Service: ENT;  Laterality: N/A;  . WISDOM TOOTH EXTRACTION     Social History   Socioeconomic History  . Marital status: Single    Spouse name: Not on file  . Number of children: Not on file  . Years of education: Not on file  . Highest education level: Not on file  Occupational History  . Not on file  Tobacco Use  . Smoking status: Never Smoker  . Smokeless tobacco: Never Used  Vaping Use  . Vaping Use: Never used  Substance and Sexual Activity  . Alcohol use: Yes    Alcohol/week: 1.0 - 2.0 standard drink    Types: 1 - 2 Glasses of wine per week    Comment: occasionally  .  Drug use: Never  . Sexual activity: Yes    Birth control/protection: Surgical  Other Topics Concern  . Not on file  Social History Narrative   2001- son   2014-daughter   50- daughter   Works as L and D Audiological scientist   Has been married twice in the past.   Enjoys running, Control and instrumentation engineer   Social Determinants of Health   Financial Resource Strain:   . Difficulty of Paying Living Expenses: Not on file  Food Insecurity:   . Worried About Charity fundraiser in the Last Year: Not on file  . Ran Out of Food in the Last Year: Not on file  Transportation Needs:   . Lack of Transportation (Medical): Not on file  . Lack of  Transportation (Non-Medical): Not on file  Physical Activity:   . Days of Exercise per Week: Not on file  . Minutes of Exercise per Session: Not on file  Stress:   . Feeling of Stress : Not on file  Social Connections:   . Frequency of Communication with Friends and Family: Not on file  . Frequency of Social Gatherings with Friends and Family: Not on file  . Attends Religious Services: Not on file  . Active Member of Clubs or Organizations: Not on file  . Attends Archivist Meetings: Not on file  . Marital Status: Not on file   No Known Allergies Family History  Problem Relation Age of Onset  . Cancer Mother        breast  . Hyperlipidemia Mother   . Hypertension Mother   . Allergic rhinitis Mother   . Hypertension Father   . [place] White syndrome Father   . Heart disease Father   . Hypertension Brother   . Asthma Brother   . Hypertension Maternal Grandmother   . Anemia Maternal Grandmother        beta thalasemia minor  . Hyperlipidemia Maternal Grandfather   . Hypertension Maternal Grandfather   . Cancer Maternal Grandfather        colon  . Heart disease Maternal Grandfather   . Stroke Maternal Grandfather   . Diabetes Paternal Grandmother   . Cancer Paternal Grandmother        breast  . Diabetes Paternal Grandfather   . Allergic rhinitis Son   . Angioedema Neg Hx   . Eczema Neg Hx   . Immunodeficiency Neg Hx   . Urticaria Neg Hx       Current Outpatient Medications (Respiratory):  .  cetirizine (ZYRTEC) 10 MG tablet, Take 1 tablet (10 mg total) by mouth daily. (Patient taking differently: Take 10 mg by mouth daily as needed. ) .  guaiFENesin (MUCINEX) 600 MG 12 hr tablet, Take 1 tablet (600 mg total) by mouth 2 (two) times daily.  Current Outpatient Medications (Analgesics):  .  acetaminophen (TYLENOL) 325 MG tablet, Take 650 mg by mouth every 6 (six) hours as needed for mild pain. .  rizatriptan (MAXALT) 10 MG tablet, Take 1 tablet (10 mg  total) by mouth as needed for migraine. May repeat in 2 hours if needed  Current Outpatient Medications (Hematological):  .  folic acid (FOLVITE) 1 MG tablet, Take 1 tablet (1 mg total) by mouth daily.  Current Outpatient Medications (Other):  .  acyclovir cream (ZOVIRAX) 5 %, Apply 5 times daily for 4 days during flares. .  ALPRAZolam (XANAX) 0.5 MG tablet, TAKE 1/2 - 1 TABLET BY MOUTH DAILY AS NEEDED .  Probiotic Product (PROBIOTIC PO), Take 1 tablet by mouth once a week.  .  triamcinolone cream (KENALOG) 0.1 %, Apply 1 application topically 2 (two) times daily. .  valACYclovir (VALTREX) 500 MG tablet, TAKE 1 TABLET BY MOUTH ONCE DAILY (Patient taking differently: Take 500 mg by mouth daily as needed. ) .  Vitamin D, Ergocalciferol, (DRISDOL) 1.25 MG (50000 UNIT) CAPS capsule, Take 1 capsule (50,000 Units total) by mouth every 7 (seven) days.   Reviewed prior external information including notes and imaging from  primary care provider As well as notes that were available from care everywhere and other healthcare systems.  Past medical history, social, surgical and family history all reviewed in electronic medical record.  No pertanent information unless stated regarding to the chief complaint.   Review of Systems:  No headache, visual changes, nausea, vomiting, diarrhea, constipation, dizziness, abdominal pain, skin rash, fevers, chills, night sweats, weight loss, swollen lymph nodes, body aches, joint swelling, chest pain, shortness of breath, mood changes. POSITIVE muscle aches  Objective  Blood pressure 104/70, pulse 70, height 5' 3"  (1.6 m), weight 132 lb (59.9 kg), last menstrual period 12/03/2019, SpO2 98 %.   General: No apparent distress alert and oriented x3 mood and affect normal, dressed appropriately.  HEENT: Pupils equal, extraocular movements intact  Respiratory: Patient's speak in full sentences and does not appear short of breath  Cardiovascular: No lower extremity  edema, non tender, no erythema  Neuro: Cranial nerves II through XII are intact, neurovascularly intact in all extremities with 2+ DTRs and 2+ pulses.  Gait normal with good balance and coordination.  MSK:   Exam does show some loss of lordosis, tenderness to palpation in the paraspinal musculature right greater than left.  Tightness of the hip flexors bilaterally and mild over the hamstrings as well.  Patient's neck some mild tightness more in the parascapular region right greater than left as well.  Negative Spurling's.  5-5 strength of the upper extremities  Osteopathic findings C2 flexed rotated and side bent right C7 flexed rotated and side bent left T3 extended rotated and side bent right inhaled third ribleft L1 flexed rotated and side bent right Sacrum right on right    Decision today to treat with OMT was based on Physical Exam  After verbal consent patient was treated with HVLA, ME, FPR techniques in cervical, thoracic, rib, lumbar and sacral areas, all areas are chronic   Patient tolerated the procedure well with improvement in symptoms  Patient given exercises, stretches and lifestyle modifications  See medications in patient instructions if given  Patient will follow up in 4-8 weeks    Impression and Recommendations:     The above documentation has been reviewed and is accurate and complete Lyndal Pulley, DO       Note: This dictation was prepared with Dragon dictation along with smaller phrase technology. Any transcriptional errors that result from this process are unintentional.

## 2019-12-17 NOTE — Patient Instructions (Addendum)
Good to see you xrays today of back and neck Hip flexor and scapular exercises Once weekly vitamin D Keep hands within peripherial vision   See me again in 6 weeks

## 2019-12-20 ENCOUNTER — Other Ambulatory Visit: Payer: Self-pay | Admitting: Family Medicine

## 2019-12-20 ENCOUNTER — Encounter: Payer: Self-pay | Admitting: Family Medicine

## 2019-12-20 DIAGNOSIS — R2232 Localized swelling, mass and lump, left upper limb: Secondary | ICD-10-CM

## 2019-12-30 MED ORDER — CITALOPRAM HYDROBROMIDE 10 MG PO TABS: 10.0000 mg | ORAL_TABLET | Freq: Every day | ORAL | 3 refills | Status: DC

## 2019-12-30 MED FILL — CITALOPRAM HBR 10 MG TABLET: 10 | 30 days supply | Qty: 30 | Fill #0

## 2020-01-07 ENCOUNTER — Other Ambulatory Visit: Payer: Self-pay

## 2020-01-07 ENCOUNTER — Emergency Department (HOSPITAL_BASED_OUTPATIENT_CLINIC_OR_DEPARTMENT_OTHER)
Admission: EM | Admit: 2020-01-07 | Discharge: 2020-01-07 | Disposition: A | Discharge status: Home / Self Care | Payer: No Typology Code available for payment source | Attending: Emergency Medicine | Admitting: Emergency Medicine

## 2020-01-07 ENCOUNTER — Encounter (HOSPITAL_BASED_OUTPATIENT_CLINIC_OR_DEPARTMENT_OTHER): Payer: Self-pay | Admitting: Emergency Medicine

## 2020-01-07 DIAGNOSIS — R002 Palpitations: Secondary | ICD-10-CM | POA: Insufficient documentation

## 2020-01-07 DIAGNOSIS — Z5321 Procedure and treatment not carried out due to patient leaving prior to being seen by health care provider: Secondary | ICD-10-CM | POA: Insufficient documentation

## 2020-01-07 NOTE — ED Triage Notes (Signed)
Pt states took Celexa for the first time yesterday at 6pm. Awoke at 2300 and felt heart racing, Alert, amb no sob . HR regular. EKG done in triage. Denies chest pain.

## 2020-01-17 ENCOUNTER — Other Ambulatory Visit: Payer: No Typology Code available for payment source

## 2020-01-28 ENCOUNTER — Ambulatory Visit: Payer: No Typology Code available for payment source | Admitting: Family Medicine

## 2020-01-31 ENCOUNTER — Ambulatory Visit
Admission: RE | Admit: 2020-01-31 | Discharge: 2020-01-31 | Disposition: A | Discharge status: Home / Self Care | Payer: No Typology Code available for payment source | Source: Ambulatory Visit | Attending: Family Medicine | Admitting: Family Medicine

## 2020-01-31 ENCOUNTER — Other Ambulatory Visit: Payer: Self-pay | Admitting: Family Medicine

## 2020-01-31 ENCOUNTER — Other Ambulatory Visit: Payer: Self-pay

## 2020-01-31 DIAGNOSIS — R2232 Localized swelling, mass and lump, left upper limb: Secondary | ICD-10-CM

## 2020-02-09 ENCOUNTER — Ambulatory Visit (INDEPENDENT_AMBULATORY_CARE_PROVIDER_SITE_OTHER): Payer: No Typology Code available for payment source | Admitting: Family Medicine

## 2020-02-09 ENCOUNTER — Other Ambulatory Visit: Payer: Self-pay

## 2020-02-09 ENCOUNTER — Encounter: Payer: Self-pay | Admitting: Family Medicine

## 2020-02-09 ENCOUNTER — Other Ambulatory Visit: Payer: Self-pay | Admitting: Family Medicine

## 2020-02-09 DIAGNOSIS — M999 Biomechanical lesion, unspecified: Secondary | ICD-10-CM | POA: Insufficient documentation

## 2020-02-09 DIAGNOSIS — M24559 Contracture, unspecified hip: Secondary | ICD-10-CM | POA: Diagnosis not present

## 2020-02-09 MED ORDER — VITAMIN D (ERGOCALCIFEROL) 1.25 MG (50000 UNIT) PO CAPS: 50000.0000 [IU] | ORAL_CAPSULE | ORAL | 0 refills | Status: DC

## 2020-02-09 NOTE — Assessment & Plan Note (Signed)

## 2020-02-09 NOTE — Patient Instructions (Addendum)
Good to see you Refilled vitamin D See me again in 6-8 weeks

## 2020-02-09 NOTE — Progress Notes (Signed)
[facility] [address] [place] Phone: [phone] Subjective:   I Kandace Blitz am serving as a Education administrator for Dr. Hulan Saas.  This visit occurred during the SARS-CoV-2 public health emergency.  Safety protocols were in place, including screening questions prior to the visit, additional usage of staff PPE, and extensive cleaning of exam room while observing appropriate contact time as indicated for disinfecting solutions.   I'm seeing this patient by the request  of:  Shelda Pal, DO  CC: back pain   [id number]   12/17/2019 Bilateral overall.  Likely secondary to this and possibly some mild anxiety.  Patient has had microcytic anemia and Crohn's disease that is concerning for more of a vitamin D deficiency and put on once weekly the left with muscle strength endurance.  Discussed with patient that icing regimen, increase activity slowly.  Responded well to manipulation.  Follow-up with me again 6 weeks   Update 02/09/2020 [place] is a 44 y.o. female coming in with complaint of back and neck pain. OMT 12/17/2019. States her neck has been doing well. [place] has been doing exercises.  Patient feels like sitting seems to be worse overall.  Trying to be more active.  Doing the exercises very occasionally.  Taking vitamins.  Vitamin D has been significantly more helpful.     Past Medical History:  Diagnosis Date  . Abnormal uterine bleeding (AUB)   . Beta thalassemia minor    02-17-2019  per pt dx as child at age 37, was tested due to family history  . Crohn's disease ([place]) GI--- dr Collene Mares   no current med.----   s/p  colon resection 07/ 2007  . Endometrial mass   . GAD (generalized anxiety disorder)   . 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 TMJ syndrome    right side  (02-17-2019  per pt has had not issues  in awhile)  . Wears glasses    Past Surgical History:  Procedure Laterality Date  . COLON RESECTION  07/ 2007--- age 49   terminal ileum and right colon w/ appendix  . COLONOSCOPY WITH PROPOFOL  last one 05-09-2010  . DILATATION & CURETTAGE/HYSTEROSCOPY WITH MYOSURE N/A 02/18/2019   Procedure: DILATATION & CURETTAGE/HYSTEROSCOPY WITH MYOSURE;  Surgeon: Servando Salina, MD;  Location: [facility];  Service: Gynecology;  Laterality: N/A;  . LAPAROSCOPIC TUBAL LIGATION Bilateral 11/07/2014   Procedure: LAPAROSCOPIC  BILATERAL TUBAL LIGATION with cautery and fulgeration of endmetrios;  Surgeon: Servando Salina, MD;  Location: [facility];  Service: Gynecology;  Laterality: Bilateral;  . TONSILLECTOMY N/A 10/08/2013   Procedure: TONSILLECTOMY;  Surgeon: Rozetta Nunnery, MD;  Location: [facility];  Service: ENT;  Laterality: N/A;  . WISDOM TOOTH EXTRACTION     Social History   Socioeconomic History  . Marital status: Single    Spouse name: Not on file  . Number of children: Not on file  . Years of education: Not on file  . Highest education level: Not on file  Occupational History  . Not on file  Tobacco Use  . Smoking status: Never Smoker  . Smokeless tobacco: Never Used  Vaping Use  . Vaping Use: Never used  Substance and Sexual Activity  . Alcohol use: Yes    Alcohol/week: 1.0 - 2.0 standard drink    Types:  1 - 2 Glasses of wine per week    Comment: occasionally  . Drug use: Never  . Sexual activity: Yes    Birth control/protection: Surgical  Other Topics Concern  . Not on file  Social History Narrative   2001- son   2014-daughter   11- daughter   Works as L and D Audiological scientist   Has been married twice in the past.   Enjoys running, Control and instrumentation engineer   Social Determinants of Health   Financial Resource Strain:   . Difficulty of Paying Living Expenses: Not on file  Food Insecurity:   . Worried About Charity fundraiser in the Last Year:  Not on file  . Ran Out of Food in the Last Year: Not on file  Transportation Needs:   . Lack of Transportation (Medical): Not on file  . Lack of Transportation (Non-Medical): Not on file  Physical Activity:   . Days of Exercise per Week: Not on file  . Minutes of Exercise per Session: Not on file  Stress:   . Feeling of Stress : Not on file  Social Connections:   . Frequency of Communication with Friends and Family: Not on file  . Frequency of Social Gatherings with Friends and Family: Not on file  . Attends Religious Services: Not on file  . Active Member of Clubs or Organizations: Not on file  . Attends Archivist Meetings: Not on file  . Marital Status: Not on file   No Known Allergies Family History  Problem Relation Age of Onset  . Cancer Mother        breast  . Hyperlipidemia Mother   . Hypertension Mother   . Allergic rhinitis Mother   . Hypertension Father   . [place] White syndrome Father   . Heart disease Father   . Hypertension Brother   . Asthma Brother   . Hypertension Maternal Grandmother   . Anemia Maternal Grandmother        beta thalasemia minor  . Hyperlipidemia Maternal Grandfather   . Hypertension Maternal Grandfather   . Cancer Maternal Grandfather        colon  . Heart disease Maternal Grandfather   . Stroke Maternal Grandfather   . Diabetes Paternal Grandmother   . Cancer Paternal Grandmother        breast  . Diabetes Paternal Grandfather   . Allergic rhinitis Son   . Angioedema Neg Hx   . Eczema Neg Hx   . Immunodeficiency Neg Hx   . Urticaria Neg Hx       Current Outpatient Medications (Respiratory):  .  cetirizine (ZYRTEC) 10 MG tablet, Take 1 tablet (10 mg total) by mouth daily. (Patient taking differently: Take 10 mg by mouth daily as needed. ) .  guaiFENesin (MUCINEX) 600 MG 12 hr tablet, Take 1 tablet (600 mg total) by mouth 2 (two) times daily.  Current Outpatient Medications (Analgesics):  .  acetaminophen  (TYLENOL) 325 MG tablet, Take 650 mg by mouth every 6 (six) hours as needed for mild pain. .  rizatriptan (MAXALT) 10 MG tablet, Take 1 tablet (10 mg total) by mouth as needed for migraine. May repeat in 2 hours if needed  Current Outpatient Medications (Hematological):  .  folic acid (FOLVITE) 1 MG tablet, Take 1 tablet (1 mg total) by mouth daily.  Current Outpatient Medications (Other):  .  acyclovir cream (ZOVIRAX) 5 %, Apply 5 times daily for 4 days during flares. Marland Kitchen  ALPRAZolam (XANAX) 0.5 MG tablet, TAKE 1/2 - 1 TABLET BY MOUTH DAILY AS NEEDED .  citalopram (CELEXA) 10 MG tablet, Take 1 tablet (10 mg total) by mouth daily. .  ondansetron (ZOFRAN) 4 MG tablet, Take 4 mg by mouth every 8 (eight) hours as needed for nausea or vomiting. .  Probiotic Product (PROBIOTIC PO), Take 1 tablet by mouth once a week.  .  triamcinolone cream (KENALOG) 0.1 %, Apply 1 application topically 2 (two) times daily. .  valACYclovir (VALTREX) 500 MG tablet, TAKE 1 TABLET BY MOUTH ONCE DAILY (Patient taking differently: Take 500 mg by mouth daily as needed. ) .  Vitamin D, Ergocalciferol, (DRISDOL) 1.25 MG (50000 UNIT) CAPS capsule, Take 1 capsule (50,000 Units total) by mouth every 7 (seven) days. .  Vitamin D, Ergocalciferol, (DRISDOL) 1.25 MG (50000 UNIT) CAPS capsule, Take 1 capsule (50,000 Units total) by mouth every 7 (seven) days.   Reviewed prior external information including notes and imaging from  primary care provider As well as notes that were available from care everywhere and other healthcare systems.  Past medical history, social, surgical and family history all reviewed in electronic medical record.  No pertanent information unless stated regarding to the chief complaint.   Review of Systems:  No headache, visual changes, nausea, vomiting, diarrhea, constipation, dizziness, abdominal pain, skin rash, fevers, chills, night sweats, weight loss, swollen lymph nodes, body aches, joint swelling,  chest pain, shortness of breath, mood changes. POSITIVE muscle aches  Objective  Blood pressure 108/70, pulse 89, height 5' 3"  (1.6 m), weight 137 lb (62.1 kg), SpO2 99 %.   General: No apparent distress alert and oriented x3 mood and affect normal, dressed appropriately.  HEENT: Pupils equal, extraocular movements intact  Respiratory: Patient's speak in full sentences and does not appear short of breath  Cardiovascular: No lower extremity edema, non tender, no erythema  Neuro: Cranial nerves II through XII are intact, neurovascularly intact in all extremities with 2+ DTRs and 2+ pulses.  Gait normal with good balance and coordination.  MSK:  Non tender with full range of motion and good stability and symmetric strength and tone of shoulders, elbows, wrist, hip, knee and ankles bilaterally.  Low back exam shows some very mild loss of lordosis.  Some tightness noted on the right side paraspinal musculature.  Mild pain over the sacroiliac joint as well as the L4-L5 area.  Patient has negative straight leg test, 5 out of 5 strength in lower extremities.  Osteopathic findings  T8 extended rotated and side bent left L3 flexed rotated and side bent right Sacrum right on right     Impression and Recommendations:     The above documentation has been reviewed and is accurate and complete Lyndal Pulley, DO

## 2020-02-09 NOTE — Assessment & Plan Note (Signed)
Continue tightness noted.  Responded well to osteopathic manipulation.  Discussed posture and ergonomics, discussed different changes patient is doing daily activities as well.  Follow-up with me again 4 to 8 weeks

## 2020-02-11 ENCOUNTER — Telehealth: Payer: Self-pay

## 2020-02-11 ENCOUNTER — Encounter: Payer: No Typology Code available for payment source | Admitting: Family Medicine

## 2020-02-11 NOTE — Telephone Encounter (Signed)
caller states she needs to reschedule her appt.   Telephone: [phone]

## 2020-02-14 ENCOUNTER — Ambulatory Visit: Payer: No Typology Code available for payment source | Admitting: Family Medicine

## 2020-02-14 ENCOUNTER — Ambulatory Visit: Payer: No Typology Code available for payment source | Admitting: Physician Assistant

## 2020-03-01 ENCOUNTER — Encounter: Payer: No Typology Code available for payment source | Admitting: Family Medicine

## 2020-03-06 ENCOUNTER — Encounter: Payer: Self-pay | Admitting: Family Medicine

## 2020-03-06 ENCOUNTER — Ambulatory Visit (INDEPENDENT_AMBULATORY_CARE_PROVIDER_SITE_OTHER): Payer: No Typology Code available for payment source | Admitting: Family Medicine

## 2020-03-06 ENCOUNTER — Other Ambulatory Visit (HOSPITAL_BASED_OUTPATIENT_CLINIC_OR_DEPARTMENT_OTHER): Payer: Self-pay | Admitting: Family Medicine

## 2020-03-06 ENCOUNTER — Other Ambulatory Visit: Payer: Self-pay

## 2020-03-06 VITALS — BP 110/68 | HR 100 | Temp 98.2°F | Ht 62.0 in | Wt 135.2 lb

## 2020-03-06 DIAGNOSIS — F411 Generalized anxiety disorder: Secondary | ICD-10-CM | POA: Diagnosis not present

## 2020-03-06 DIAGNOSIS — G4726 Circadian rhythm sleep disorder, shift work type: Secondary | ICD-10-CM

## 2020-03-06 DIAGNOSIS — Z1159 Encounter for screening for other viral diseases: Secondary | ICD-10-CM | POA: Diagnosis not present

## 2020-03-06 DIAGNOSIS — E559 Vitamin D deficiency, unspecified: Secondary | ICD-10-CM

## 2020-03-06 DIAGNOSIS — Z0000 Encounter for general adult medical examination without abnormal findings: Secondary | ICD-10-CM | POA: Diagnosis not present

## 2020-03-06 DIAGNOSIS — Z114 Encounter for screening for human immunodeficiency virus [HIV]: Secondary | ICD-10-CM | POA: Diagnosis not present

## 2020-03-06 LAB — LIPID PANEL
Cholesterol: 136 mg/dL (ref 0–200)
HDL: 89.3 mg/dL (ref >39.00)
LDL Cholesterol: 35 mg/dL (ref 0–99)
NonHDL: 46.99
Total CHOL/HDL Ratio: 2
Triglycerides: 61 mg/dL (ref 0.0–149.0)
VLDL: 12.2 mg/dL (ref 0.0–40.0)

## 2020-03-06 LAB — COMPREHENSIVE METABOLIC PANEL
ALT: 9 U/L (ref 0–35)
AST: 15 U/L (ref 0–37)
Albumin: 4.3 g/dL (ref 3.5–5.2)
Alkaline Phosphatase: 53 U/L (ref 39–117)
BUN: 11 mg/dL (ref 6–23)
CO2: 26 mEq/L (ref 19–32)
Calcium: 9.2 mg/dL (ref 8.4–10.5)
Chloride: 103 mEq/L (ref 96–112)
Creatinine, Ser: 0.72 mg/dL (ref 0.40–1.20)
GFR: 101.46 mL/min (ref >60.00)
Glucose, Bld: 80 mg/dL (ref 70–99)
Potassium: 3.8 mEq/L (ref 3.5–5.1)
Sodium: 136 mEq/L (ref 135–145)
Total Bilirubin: 0.8 mg/dL (ref 0.2–1.2)
Total Protein: 6.9 g/dL (ref 6.0–8.3)

## 2020-03-06 LAB — VITAMIN D 25 HYDROXY (VIT D DEFICIENCY, FRACTURES): VITD: 48.8 ng/mL (ref 30.00–100.00)

## 2020-03-06 MED ORDER — FLUVOXAMINE MALEATE 25 MG PO TABS: 25.0000 mg | ORAL_TABLET | Freq: Every day | ORAL | 2 refills | Status: DC

## 2020-03-06 MED ORDER — ACYCLOVIR 5 % EX CREA: TOPICAL_CREAM | CUTANEOUS | 2 refills | Status: DC

## 2020-03-06 MED ORDER — ZOLPIDEM TARTRATE 5 MG PO TABS: 5.0000 mg | ORAL_TABLET | Freq: Every evening | ORAL | 1 refills | Status: DC | PRN

## 2020-03-06 MED FILL — ACYCLOVIR 5% OINTMENT: 5 | 5 days supply | Qty: 15 | Fill #0

## 2020-03-06 MED FILL — VIT D2 1.25 MG (50,000 UNIT: 1.25 MG | 84 days supply | Qty: 12 | Fill #0

## 2020-03-06 MED FILL — FLUVOXAMINE MALEATE 25 MG T: 25 | 30 days supply | Qty: 30 | Fill #0

## 2020-03-06 MED FILL — ZOLPIDEM TARTRATE 5 MG TAB: 5 | 15 days supply | Qty: 15 | Fill #0

## 2020-03-06 NOTE — Progress Notes (Signed)
Chief Complaint  Patient presents with  . Annual Exam     Well Woman Susan Montoya is here for a complete physical.   Her last physical was >1 year ago.  Current diet: in general, a "healthy" diet. Current exercise: running. Weight is stable and she denies fatigue out of ordinary. Seatbelt? Yes  Health Maintenance Pap/HPV- Yes Mammogram- Yes  Tetanus- Yes Hep C screening- No HIV screening- Yes   She has back issues and gets freq massages that are helpful.  Anxiety The patient presents for follow-up for anxiety.  She was started on Celexa 10 mg daily.  It gave her diarrhea on 2 different occasions of taking it so she stopped.  She is interested in the Luvox.  She has failed Effexor, Lexapro, BuSpar and Zoloft as well.  She takes Xanax 0.25 mg as needed.  On average less than once per week.  No homicidal or suicidal ideation.  No self-medication.  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  She is working nights once a week and would like something to help with falling asleep after a night shift.  She is interested in Ambien.  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  Past Medical History:  Diagnosis Date  . Abnormal uterine bleeding (AUB)   . Beta thalassemia minor    02-17-2019  per pt dx as child at age 60, was tested due to family history  . Crohn's disease ([place]) GI--- dr Collene Mares   no current med.----   s/p  colon resection 07/ 2007  . Endometrial mass   . GAD (generalized anxiety disorder)   . 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 TMJ syndrome    right side  (02-17-2019  per pt has had not issues in awhile)  . Wears glasses      Past Surgical History:  Procedure Laterality Date  . COLON RESECTION  07/ 2007--- age 44   terminal ileum and right colon w/ appendix  . COLONOSCOPY WITH PROPOFOL  last one 05-09-2010  . DILATATION &  CURETTAGE/HYSTEROSCOPY WITH MYOSURE N/A 02/18/2019   Procedure: DILATATION & CURETTAGE/HYSTEROSCOPY WITH MYOSURE;  Surgeon: Servando Salina, MD;  Location: [facility];  Service: Gynecology;  Laterality: N/A;  . LAPAROSCOPIC TUBAL LIGATION Bilateral 11/07/2014   Procedure: LAPAROSCOPIC  BILATERAL TUBAL LIGATION with cautery and fulgeration of endmetrios;  Surgeon: Servando Salina, MD;  Location: [facility];  Service: Gynecology;  Laterality: Bilateral;  . TONSILLECTOMY N/A 10/08/2013   Procedure: TONSILLECTOMY;  Surgeon: Rozetta Nunnery, MD;  Location: [facility];  Service: ENT;  Laterality: N/A;  . WISDOM TOOTH EXTRACTION      Medications  Current Outpatient Medications on File Prior to Visit  Medication Sig Dispense Refill  . acetaminophen (TYLENOL) 325 MG tablet Take 650 mg by mouth every 6 (six) hours as needed for mild pain.    Marland Kitchen acyclovir cream (ZOVIRAX) 5 % Apply 5 times daily for 4 days during flares. 5 g 2  . ALPRAZolam (XANAX) 0.5 MG tablet TAKE 1/2 - 1 TABLET BY MOUTH DAILY AS NEEDED 30 tablet 2  . citalopram (CELEXA) 10 MG tablet Take 1 tablet (10 mg total) by mouth daily. 30 tablet 3  . ondansetron (ZOFRAN) 4 MG tablet Take 4 mg by mouth every 8 (eight) hours as  needed for nausea or vomiting.    . Probiotic Product (PROBIOTIC PO) Take 1 tablet by mouth once a week.     . rizatriptan (MAXALT) 10 MG tablet Take 1 tablet (10 mg total) by mouth as needed for migraine. May repeat in 2 hours if needed 10 tablet 2  . valACYclovir (VALTREX) 500 MG tablet TAKE 1 TABLET BY MOUTH ONCE DAILY (Patient taking differently: Take 500 mg by mouth daily as needed. ) 30 tablet 3  . Vitamin D, Ergocalciferol, (DRISDOL) 1.25 MG (50000 UNIT) CAPS capsule Take 1 capsule (50,000 Units total) by mouth every 7 (seven) days. 12 capsule 0   Allergies No Known Allergies  Review of Systems: Constitutional:  no unexpected weight changes Eye:  no recent significant change  in vision Ear/Nose/Mouth/Throat:  Ears:  no recent change in hearing Nose/Mouth/Throat:  no complaints of nasal congestion, no sore throat Cardiovascular: no chest pain Respiratory:  no shortness of breath Gastrointestinal:  no abdominal pain, no change in bowel habits GU:  Female: negative for dysuria or pelvic pain Musculoskeletal/Extremities:  no pain of the joints Integumentary (Skin/Breast):  no abnormal skin lesions reported Neurologic:  no headaches Endocrine:  denies fatigue Hematologic/Lymphatic:  No areas of easy bleeding  Exam BP 110/68 (BP Location: Left Arm, Patient Position: Sitting, Cuff Size: Normal)   Pulse 100   Temp 98.2 F (36.8 C) (Oral)   Ht 5' 2"  (1.575 m)   Wt 135 lb 4 oz (61.3 kg)   SpO2 96%   BMI 24.74 kg/m  General:  well developed, well nourished, in no apparent distress Skin:  no significant moles, warts, or growths Head:  no masses, lesions, or tenderness Eyes:  pupils equal and round, sclera anicteric without injection Ears:  canals without lesions, TMs shiny without retraction, no obvious effusion, no erythema Nose:  nares patent, septum midline, mucosa normal, and no drainage or sinus tenderness Throat/Pharynx:  lips and gingiva without lesion; tongue and uvula midline; non-inflamed pharynx; no exudates or postnasal drainage Neck: neck supple without adenopathy, thyromegaly, or masses Lungs:  clear to auscultation, breath sounds equal bilaterally, no respiratory distress Cardio:  regular rate and rhythm, no LE edema Abdomen:  abdomen soft, nontender; bowel sounds normal; no masses or organomegaly Genital: Defer to GYN Musculoskeletal:  symmetrical muscle groups noted without atrophy or deformity Extremities:  no clubbing, cyanosis, or edema, no deformities, no skin discoloration Neuro:  gait normal; deep tendon reflexes normal and symmetric Psych: well oriented with normal range of affect and appropriate judgment/insight  Assessment and  Plan  Well adult exam - Plan: Lipid panel, Comprehensive metabolic panel  Encounter for hepatitis C screening test for low risk patient - Plan: Hepatitis C antibody  Vitamin D deficiency - Plan: VITAMIN D 25 Hydroxy (Vit-D Deficiency, Fractures)  Screening for HIV (human immunodeficiency virus) - Plan: HIV Antibody (routine testing w rflx)  Anxiety state - Plan: fluvoxaMINE (LUVOX) 25 MG tablet  Sleep disorder, shift work - Plan: zolpidem (AMBIEN) 5 MG tablet   Well 44 y.o. female. Counseled on diet and exercise. Other orders as above. For anxiety, stop Celexa and start Luvox.  Counseled on diet and exercise.  She is in the process of setting up with a new counselor. We will prescribe low-dose of Ambien, consider cutting tab in half if feasible.  She knows not to use it with her Xanax. Follow up in 6 weeks. The patient voiced understanding and agreement to the plan.  [place], DO 03/06/20 1:45  PM

## 2020-03-06 NOTE — Patient Instructions (Addendum)
Give [place] 2-3 business days to get the results of your labs back.   Keep the diet clean and stay active.  Don't take the Ambien and Xanax at the same time.   Let us know if you need anything.

## 2020-03-07 LAB — HEPATITIS C ANTIBODY
Hepatitis C Ab: NONREACTIVE
SIGNAL TO CUT-OFF: 0.01 (ref <1.00)

## 2020-03-07 LAB — HIV ANTIBODY (ROUTINE TESTING W REFLEX): HIV 1&2 Ab, 4th Generation: NONREACTIVE

## 2020-03-10 MED FILL — ALPRAZolam 0.5 MG TABS: 0.5 | 30 days supply | Qty: 30 | Fill #2

## 2020-03-28 ENCOUNTER — Ambulatory Visit (INDEPENDENT_AMBULATORY_CARE_PROVIDER_SITE_OTHER): Payer: No Typology Code available for payment source | Admitting: Family Medicine

## 2020-03-28 ENCOUNTER — Other Ambulatory Visit: Payer: Self-pay

## 2020-03-28 ENCOUNTER — Encounter: Payer: Self-pay | Admitting: Family Medicine

## 2020-03-28 ENCOUNTER — Encounter: Payer: Self-pay | Admitting: Dermatology

## 2020-03-28 ENCOUNTER — Ambulatory Visit (INDEPENDENT_AMBULATORY_CARE_PROVIDER_SITE_OTHER): Payer: No Typology Code available for payment source | Admitting: Dermatology

## 2020-03-28 VITALS — BP 112/80 | HR 81 | Ht 62.0 in | Wt 136.0 lb

## 2020-03-28 DIAGNOSIS — Z1283 Encounter for screening for malignant neoplasm of skin: Secondary | ICD-10-CM

## 2020-03-28 DIAGNOSIS — L814 Other melanin hyperpigmentation: Secondary | ICD-10-CM

## 2020-03-28 DIAGNOSIS — M999 Biomechanical lesion, unspecified: Secondary | ICD-10-CM | POA: Diagnosis not present

## 2020-03-28 DIAGNOSIS — M24559 Contracture, unspecified hip: Secondary | ICD-10-CM

## 2020-03-28 NOTE — Progress Notes (Signed)
[place] [address] [place] Phone: [phone] Subjective:   I Susan Montoya am serving as a Education administrator for Dr. Hulan Saas.  This visit occurred during the SARS-CoV-2 public health emergency.  Safety protocols were in place, including screening questions prior to the visit, additional usage of staff PPE, and extensive cleaning of exam room while observing appropriate contact time as indicated for disinfecting solutions.   I'm seeing this patient by the request  of:  Shelda Pal, DO  CC: Low back pain follow-up  [id number]  Susan Montoya is a 44 y.o. female coming in with complaint of back and neck pain. OMT 02/09/2020. Patient states she is doing a lot better.  Making significant progress.  Patient states has been even able to run some.  States it does well unless she is running with her kids which does change her gait.  Patient denies any of these significant pain that needs to stop her from activity.  Not taking anything for pain.  Still some mild discomfort but nothing that stops her.  Medications patient has been prescribed: Vitamin D  Taking: Yes         Reviewed prior external information including notes and imaging from previsou exam, outside providers and external EMR if available.   As well as notes that were available from care everywhere and other healthcare systems.  Past medical history, social, surgical and family history all reviewed in electronic medical record.  No pertanent information unless stated regarding to the chief complaint.   Past Medical History:  Diagnosis Date  . Abnormal uterine bleeding (AUB)   . Beta thalassemia minor    02-17-2019  per pt dx as child at age 74, was tested due to family history  . Crohn's disease ([place]) GI--- dr Collene Mares   no current med.----   s/p  colon resection 07/ 2007  . Endometrial mass   . GAD (generalized anxiety disorder)   . HSV (herpes simplex virus)  anogenital infection    HSV 2  (02-17-2019  per pt currently no flare)  . TMJ syndrome    right side  (02-17-2019  per pt has had not issues in awhile)  . Wears glasses     No Known Allergies   Review of Systems:  No headache, visual changes, nausea, vomiting, diarrhea, constipation, dizziness, abdominal pain, skin rash, fevers, chills, night sweats, weight loss, swollen lymph nodes, body aches, joint swelling, chest pain, shortness of breath, mood changes. POSITIVE muscle aches but minimal  Objective  Blood pressure 112/80, pulse 81, height 5' 2"  (1.575 m), weight 136 lb (61.7 kg), SpO2 98 %.   General: No apparent distress alert and oriented x3 mood and affect normal, dressed appropriately.  HEENT: Pupils equal, extraocular movements intact  Respiratory: Patient's speak in full sentences and does not appear short of breath  Cardiovascular: No lower extremity edema, non tender, no erythema  Neuro: Cranial nerves II through XII are intact, neurovascularly intact in all extremities with 2+ DTRs and 2+ pulses.  Gait normal with good balance and coordination.  MSK:  Non tender with full range of motion and good stability and symmetric strength and tone of shoulders, elbows, wrist, hip, knee and ankles bilaterally.  Back - Normal skin, Spine with normal alignment and no deformity.  No tenderness to vertebral process palpation.  Paraspinous muscles are not tender and without spasm.   Range of motion is full at neck and lumbar sacral regions  Osteopathic findings  T4 extended rotated and side bent left L1 flexed rotated and side bent right Sacrum right on right       Assessment and Plan:  Hip flexor tendon tightness, unspecified laterality Continue tightness noted.  Patient is making some progress.  We discussed with patient about continuing the exercises regularly.  Patient can switch to over-the-counter vitamin D if she would like.  Encouraged her to continue to do the exercises.   Follow-up with me again in 3 months   Nonallopathic problems  Decision today to treat with OMT was based on Physical Exam  After verbal consent patient was treated with HVLA, ME, FPR techniques in , thoracic, lumbar, and sacral  areas  Patient tolerated the procedure well with improvement in symptoms  Patient given exercises, stretches and lifestyle modifications  See medications in patient instructions if given  Patient will follow up in 4-8 weeks      The above documentation has been reviewed and is accurate and complete Lyndal Pulley, DO       Note: This dictation was prepared with Dragon dictation along with smaller phrase technology. Any transcriptional errors that result from this process are unintentional.

## 2020-03-28 NOTE — Assessment & Plan Note (Signed)
Continue tightness noted.  Patient is making some progress.  We discussed with patient about continuing the exercises regularly.  Patient can switch to over-the-counter vitamin D if she would like.  Encouraged her to continue to do the exercises.  Follow-up with me again in 3 months

## 2020-03-28 NOTE — Patient Instructions (Addendum)
Good to see you Back in balance massage therapy Happy Holidays! See me again in 3 months

## 2020-03-29 ENCOUNTER — Encounter: Payer: Self-pay | Admitting: Dermatology

## 2020-03-29 NOTE — Progress Notes (Signed)
   New Patient   Subjective  Susan Montoya is a 44 y.o. female who presents for the following: New Patient (Initial Visit) (Patient here today for spot on her left cheek x years now getting larger. No bleeding, no pain).  New discoloration Location: Cheeks Duration:  Quality:  Associated Signs/Symptoms: Modifying Factors:  Severity:  Timing: Context:    The following portions of the chart were reviewed this encounter and updated as appropriate: Tobacco  Allergies  Meds  Problems  Med Hx  Surg Hx  Fam Hx      Objective  Well appearing patient in no apparent distress; mood and affect are within normal limits.  All sun exposed areas plus back examined.   Assessment & Plan  Solar lentigo (2) Left Malar Cheek; Right Malar Cheek  Return for biopsy if there is clinical change.  Skin exam for malignant neoplasm Mid Back  Self examine her skin twice annually.  See dermatologist if there is any change.

## 2020-04-04 ENCOUNTER — Other Ambulatory Visit (HOSPITAL_BASED_OUTPATIENT_CLINIC_OR_DEPARTMENT_OTHER): Payer: Self-pay | Admitting: Obstetrics and Gynecology

## 2020-04-04 MED FILL — NORETHINDRONE 0.35 MG TABS: 0.35 | 84 days supply | Qty: 84 | Fill #0

## 2020-04-12 DIAGNOSIS — N920 Excessive and frequent menstruation with regular cycle: Secondary | ICD-10-CM

## 2020-04-13 ENCOUNTER — Other Ambulatory Visit (HOSPITAL_BASED_OUTPATIENT_CLINIC_OR_DEPARTMENT_OTHER): Payer: Self-pay | Admitting: Obstetrics and Gynecology

## 2020-04-13 MED FILL — MEGESTROL 40 MG TABLET: 40 | 30 days supply | Qty: 30 | Fill #0

## 2020-04-17 ENCOUNTER — Telehealth: Payer: No Typology Code available for payment source | Admitting: Family Medicine

## 2020-04-18 ENCOUNTER — Ambulatory Visit: Payer: No Typology Code available for payment source | Admitting: Family Medicine

## 2020-04-24 ENCOUNTER — Encounter: Payer: Self-pay | Admitting: Family Medicine

## 2020-04-24 ENCOUNTER — Telehealth (INDEPENDENT_AMBULATORY_CARE_PROVIDER_SITE_OTHER): Payer: No Typology Code available for payment source | Admitting: Family Medicine

## 2020-04-24 ENCOUNTER — Other Ambulatory Visit: Payer: Self-pay

## 2020-04-24 DIAGNOSIS — F411 Generalized anxiety disorder: Secondary | ICD-10-CM | POA: Diagnosis not present

## 2020-04-24 DIAGNOSIS — F410 Panic disorder [episodic paroxysmal anxiety] without agoraphobia: Secondary | ICD-10-CM | POA: Diagnosis not present

## 2020-04-24 NOTE — Progress Notes (Signed)
Chief Complaint  Patient presents with  . Follow-up    Subjective Susan Montoya presents for f/u anxiety. Due to COVID-19 pandemic, we are interacting via web portal for an electronic face-to-face visit. I verified patient's ID using 2 identifiers. Patient agreed to proceed with visit via this method. Patient is at home, I am at office. Patient and I are present for visit.   Pt is currently being treated with Xanax 0.25 mg prn, uses around 3x/week on average. Rx'd Ambien for sleep/wake shift disorder, worked well, took it twice in past 6 mo  Reports headaches since treatment w Luvox so she stopped. She has now failed Effexor, Zoloft, Lexapro, BuSpar, Luvox No thoughts of harming self or others. No self-medication with alcohol, prescription drugs or illicit drugs. Pt is not following with a counselor/psychologist.  Past Medical History:  Diagnosis Date  . Abnormal uterine bleeding (AUB)   . Beta thalassemia minor    02-17-2019  per pt dx as child at age 33, was tested due to family history  . Crohn's disease ([place]) GI--- dr Collene Mares   no current med.----   s/p  colon resection 07/ 2007  . Endometrial mass   . GAD (generalized anxiety disorder)   . 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 TMJ syndrome    right side  (02-17-2019  per pt has had not issues in awhile)  . Wears glasses    Allergies as of 04/24/2020   No Known Allergies     Medication List       Accurate as of April 24, 2020  4:27 PM. If you have any questions, ask your nurse or doctor.        STOP taking these medications   fluvoxaMINE 25 MG tablet Commonly known as: LUVOX Stopped by: Shelda Pal, DO   ondansetron 4 MG tablet Commonly known as: ZOFRAN Stopped by: Shelda Pal, DO     TAKE these medications   acetaminophen 325 MG tablet Commonly known as: TYLENOL Take 650 mg by mouth every 6 (six) hours as needed for mild  pain.   acyclovir cream 5 % Commonly known as: Zovirax Apply 5 times daily for 4 days during flares.   acyclovir ointment 5 % Commonly known as: ZOVIRAX Apply topically 5 (five) times daily.   ALPRAZolam 0.5 MG tablet Commonly known as: XANAX TAKE 1/2 - 1 TABLET BY MOUTH DAILY AS NEEDED   PROBIOTIC PO Take 1 tablet by mouth once a week.   rizatriptan 10 MG tablet Commonly known as: MAXALT Take 1 tablet (10 mg total) by mouth as needed for migraine. May repeat in 2 hours if needed   valACYclovir 500 MG tablet Commonly known as: VALTREX TAKE 1 TABLET BY MOUTH ONCE DAILY What changed:   when to take this  reasons to take this   Vitamin D (Ergocalciferol) 1.25 MG (50000 UNIT) Caps capsule Commonly known as: DRISDOL Take 1 capsule (50,000 Units total) by mouth every 7 (seven) days.   zolpidem 5 MG tablet Commonly known as: AMBIEN Take 1 tablet (5 mg total) by mouth at bedtime as needed for sleep.       Exam No conversational dyspnea Age appropriate judgment and insight Nml affect and mood  Assessment and Plan  Anxiety state  Panic attacks  Continue Xanax 0.25 mg as needed.  She is not using it excessively.  Offered to trial [employer] but  she would like to see how she does with a supplement.  She will let me know if anything changes and I will send it in and schedule a 1 month follow-up. F/u in 6 months for med check. The patient voiced understanding and agreement to the plan.  [place], DO 04/24/20 4:27 PM

## 2020-05-03 ENCOUNTER — Encounter: Payer: Self-pay | Admitting: Family Medicine

## 2020-05-23 ENCOUNTER — Other Ambulatory Visit: Payer: Self-pay | Admitting: Family Medicine

## 2020-05-23 DIAGNOSIS — F410 Panic disorder [episodic paroxysmal anxiety] without agoraphobia: Secondary | ICD-10-CM

## 2020-05-23 MED FILL — ALPRAZolam 0.5 MG TABS: 0.5 | 30 days supply | Qty: 30 | Fill #0

## 2020-05-23 MED FILL — ZOLPIDEM TARTRATE 5 MG TAB: 5 | 15 days supply | Qty: 15 | Fill #1

## 2020-05-23 NOTE — Telephone Encounter (Signed)
Last OV--04/24/2020 Last RF--09/28/2019--#30 with 2 refills No CSC/UDS

## 2020-05-24 MED FILL — RIZATRIPTAN BENZOATE 10 MG: 10 | 30 days supply | Qty: 12 | Fill #1

## 2020-06-06 ENCOUNTER — Ambulatory Visit (INDEPENDENT_AMBULATORY_CARE_PROVIDER_SITE_OTHER): Payer: No Typology Code available for payment source | Admitting: Family Medicine

## 2020-06-06 ENCOUNTER — Other Ambulatory Visit: Payer: Self-pay

## 2020-06-06 ENCOUNTER — Encounter: Payer: Self-pay | Admitting: Family Medicine

## 2020-06-06 VITALS — BP 118/78 | HR 61 | Temp 98.2°F | Ht 62.5 in | Wt 141.4 lb

## 2020-06-06 DIAGNOSIS — N939 Abnormal uterine and vaginal bleeding, unspecified: Secondary | ICD-10-CM

## 2020-06-06 LAB — TSH: TSH: 1.09 u[IU]/mL (ref 0.35–4.50)

## 2020-06-06 LAB — LUTEINIZING HORMONE: LH: 20.71 m[IU]/mL

## 2020-06-06 LAB — FOLLICLE STIMULATING HORMONE: FSH: 7.5 m[IU]/mL

## 2020-06-06 LAB — T4, FREE: Free T4: 0.8 ng/dL (ref 0.60–1.60)

## 2020-06-06 NOTE — Patient Instructions (Signed)
Give [place] 2-3 business days to get the results of your labs back.   Let me know if your GYN wants to order any imaging.  Nortriptyline (Pamelor) or amitriptyline (Elavil) are the TCA's I most commonly use.   Let us know if you need anything.

## 2020-06-06 NOTE — Progress Notes (Signed)
Chief Complaint  Patient presents with  . Menstrual Problem    Subjective: Patient is a 45 y.o. female here for AUB f/u.  Patient has been working with OB/GYN regarding abnormal uterine bleeding.  She will have her cycle and then 2 weeks later have another one.  She was put on the minipill and then increase the dosage, neither of which helped.  She is scheduled for an endometrial biopsy but her GYN is waiting until she stops bleeding.  She has not had her thyroid level checked recently.  She also has a history of a tubal ligation is wondering if the [place] clips have gone loose.  She is not having any pain.  Past Medical History:  Diagnosis Date  . Abnormal uterine bleeding (AUB)   . Beta thalassemia minor    02-17-2019  per pt dx as child at age 79, was tested due to family history  . Crohn's disease ([place]) GI--- dr Collene Mares   no current med.----   s/p  colon resection 07/ 2007  . Endometrial mass   . GAD (generalized anxiety disorder)   . 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 TMJ syndrome    right side  (02-17-2019  per pt has had not issues in awhile)  . Wears glasses     Objective: BP 118/78 (BP Location: Left Arm, Patient Position: Sitting, Cuff Size: Normal)   Pulse 61   Temp 98.2 F (36.8 C) (Oral)   Ht 5' 2.5" (1.588 m)   Wt 141 lb 6 oz (64.1 kg)   SpO2 100%   BMI 25.45 kg/m  General: Awake, appears stated age Abdomen: Soft, nontender, nondistended, no masses or organomegaly Lungs: No accessory muscle use Psych: Age appropriate judgment and insight, normal affect and mood  Assessment and Plan: Abnormal uterine bleeding (AUB) - Plan: TSH, T4, free, LH, FSH  Check above labs.  Agree with GYN that she needs an endometrial biopsy.  I do not think she has any foreign bodies loose in her abdomen given her lack of pain or any fevers. The patient voiced understanding and agreement to the plan.  [place], DO 06/06/20  2:27 PM

## 2020-07-03 ENCOUNTER — Ambulatory Visit: Payer: No Typology Code available for payment source | Admitting: Family Medicine

## 2020-07-07 ENCOUNTER — Encounter: Payer: Self-pay | Admitting: Family Medicine

## 2020-09-05 ENCOUNTER — Other Ambulatory Visit: Payer: Self-pay | Admitting: Family Medicine

## 2020-09-05 ENCOUNTER — Other Ambulatory Visit (HOSPITAL_BASED_OUTPATIENT_CLINIC_OR_DEPARTMENT_OTHER): Payer: Self-pay

## 2020-09-05 DIAGNOSIS — G4726 Circadian rhythm sleep disorder, shift work type: Secondary | ICD-10-CM

## 2020-09-05 MED ORDER — ZOLPIDEM TARTRATE 5 MG PO TABS
ORAL_TABLET | ORAL | 1 refills | Status: DC
  Filled 2020-09-05: qty 15, 15d supply, fill #0
  Filled 2021-01-02: qty 15, 15d supply, fill #1

## 2020-09-05 MED FILL — Alprazolam Tab 0.5 MG: ORAL | 30 days supply | Qty: 30 | Fill #0 | Status: AC

## 2020-09-05 NOTE — Telephone Encounter (Signed)
Last Office visit on----06/06/2020 Last Refill on-----03/06/2020   #15 with 1 refill

## 2020-09-06 ENCOUNTER — Other Ambulatory Visit (HOSPITAL_BASED_OUTPATIENT_CLINIC_OR_DEPARTMENT_OTHER): Payer: Self-pay

## 2020-12-05 ENCOUNTER — Other Ambulatory Visit (HOSPITAL_BASED_OUTPATIENT_CLINIC_OR_DEPARTMENT_OTHER): Payer: Self-pay

## 2020-12-05 ENCOUNTER — Encounter: Payer: Self-pay | Admitting: Family Medicine

## 2020-12-05 ENCOUNTER — Other Ambulatory Visit: Payer: Self-pay

## 2020-12-05 ENCOUNTER — Ambulatory Visit (INDEPENDENT_AMBULATORY_CARE_PROVIDER_SITE_OTHER): Payer: No Typology Code available for payment source | Admitting: Family Medicine

## 2020-12-05 VITALS — BP 120/82 | HR 69 | Temp 98.1°F | Ht 63.0 in | Wt 140.2 lb

## 2020-12-05 DIAGNOSIS — F411 Generalized anxiety disorder: Secondary | ICD-10-CM | POA: Diagnosis not present

## 2020-12-05 DIAGNOSIS — D561 Beta thalassemia: Secondary | ICD-10-CM | POA: Diagnosis not present

## 2020-12-05 DIAGNOSIS — K5080 Crohn's disease of both small and large intestine without complications: Secondary | ICD-10-CM | POA: Diagnosis not present

## 2020-12-05 MED ORDER — ESCITALOPRAM OXALATE 10 MG PO TABS
10.0000 mg | ORAL_TABLET | Freq: Every day | ORAL | 5 refills | Status: DC
  Filled 2020-12-05: qty 30, 30d supply, fill #0
  Filled 2021-01-02: qty 30, 30d supply, fill #1
  Filled 2021-02-02: qty 30, 30d supply, fill #2
  Filled 2021-02-28: qty 30, 30d supply, fill #3
  Filled 2021-03-28: qty 30, 30d supply, fill #4
  Filled 2021-05-02: qty 30, 30d supply, fill #5

## 2020-12-05 NOTE — Progress Notes (Signed)
Chief Complaint  Patient presents with   Susan Montoya presents for f/u anxiety.  Pt is currently being treated with nothing.  Reports using alprazolam 2-3 times per week which is increased.  Stressed from working and Pensions consultant.  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  Pt is not following with a counselor/psychologist but is in talks with [facility].  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 No respiratory distress Psych: well oriented with normal range of affect and age-appropriate judgement/insight, alert and oriented x4.  Assessment and Plan  GAD (generalized anxiety disorder) - Plan: escitalopram (LEXAPRO) 10 MG tablet, Ambulatory referral to Psychology  Crohn's disease of both small and large intestine without complication ([place]), Chronic  Beta-thalassemia ([place]), Chronic  Chronic, uncontrolled. Start back on Lexapro 5 mg/d for 2 weeks, then increase to 10 mg/d. Refer to psychology. Anxiety coping tech's discussed.  F/u in 6 weeks. The patient voiced understanding and agreement to the plan.  [place], DO 12/05/20 10:09 AM

## 2020-12-05 NOTE — Patient Instructions (Signed)
If you do not hear anything about your referral in the next 1-2 weeks, call our office and ask for an update.  Aim to do some physical exertion for 150 minutes per week. This is typically divided into 5 days per week, 30 minutes per day. The activity should be enough to get your heart rate up. Anything is better than nothing if you have time constraints.  Coping skills Choose 5 that work for you: Take a deep breath Count to 20 Read a book Do a puzzle Meditate Bake Sing Knit Garden Pray Go outside Call a friend Listen to music Take a walk Color Send a note Take a bath Watch a movie Be alone in a quiet place Pet an animal Visit a friend Journal Exercise Stretch

## 2021-01-02 ENCOUNTER — Other Ambulatory Visit (HOSPITAL_BASED_OUTPATIENT_CLINIC_OR_DEPARTMENT_OTHER): Payer: Self-pay

## 2021-01-02 MED FILL — Acyclovir Oint 5%: CUTANEOUS | 5 days supply | Qty: 15 | Fill #0 | Status: AC

## 2021-01-16 ENCOUNTER — Encounter: Payer: Self-pay | Admitting: Family Medicine

## 2021-01-16 ENCOUNTER — Telehealth (INDEPENDENT_AMBULATORY_CARE_PROVIDER_SITE_OTHER): Payer: No Typology Code available for payment source | Admitting: Family Medicine

## 2021-01-16 DIAGNOSIS — F411 Generalized anxiety disorder: Secondary | ICD-10-CM

## 2021-01-16 NOTE — Progress Notes (Signed)
Chief Complaint  Patient presents with   Follow-up    6 week    Subjective Susan Montoya presents for f/u anxiety. Due to COVID-19 pandemic, we are interacting via web portal for an electronic face-to-face visit. I verified patient's ID using 2 identifiers. Patient agreed to proceed with visit via this method. Patient is at home, I am at office. Patient and I are present for visit.   Pt is currently being treated with Lexapro 10 mg/d, Xanax prn.  Reports around 75% improvement since treatment. No longer having any side effects.  Pt is following with a counselor/psychologist.  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 If you have any questions, ask your nurse or doctor.          acetaminophen 325 MG tablet Commonly known as: TYLENOL Take 650 mg by mouth every 6 (six) hours as needed for mild pain.   acyclovir ointment 5 % Commonly known as: ZOVIRAX APPLY 5 TIMES DAILY FOR 4 DAYS DURING FLARES.   escitalopram 10 MG tablet Commonly known as: Lexapro Take 1 tablet (10 mg total) by mouth daily.   PROBIOTIC PO Take 1 tablet by mouth once a week.   rizatriptan 10 MG tablet Commonly known as: MAXALT TAKE 1 TABLET (10 MG TOTAL) BY MOUTH AS NEEDED FOR MIGRAINE. MAY REPEAT IN 2 HOURS IF NEEDED   valACYclovir 500 MG tablet Commonly known as: VALTREX TAKE 1 TABLET BY MOUTH  ONCE DAILY What changed:  when to take this reasons to take this   zolpidem 5 MG tablet Commonly known as: AMBIEN TAKE 1 TABLET (5 MG TOTAL) BY MOUTH AT BEDTIME AS NEEDED FOR SLEEP.        Exam No conversational dyspnea Age appropriate judgment and insight Nml affect and mood  Assessment and Plan  GAD (generalized anxiety disorder)  Chronic, stable. Cont Lexapro 10 mg/d.  F/u as originally scheduled. The patient voiced understanding and agreement to the plan.  [place], DO 01/16/21 9:26 AM

## 2021-01-22 ENCOUNTER — Encounter: Payer: Self-pay | Admitting: Family Medicine

## 2021-01-23 NOTE — Progress Notes (Signed)
[facility] [facility] [place] Phone: [phone] Subjective:   Fontaine No, am serving as a scribe for Dr. Hulan Saas.  This visit occurred during the SARS-CoV-2 public health emergency.  Safety protocols were in place, including screening questions prior to the visit, additional usage of staff PPE, and extensive cleaning of exam room while observing appropriate contact time as indicated for disinfecting solutions.   I'm seeing this patient by the request  of:  Shelda Pal, DO  CC: Right elbow pain  [id number]  Susan Montoya is a 45 y.o. female coming in with complaint of right elbow pain for 2 weeks. Last seen for OMT in November 2021. Patient states that she has pain with lifting children, pull doors opening, lifting coffee mug. Pain over lateral aspect with palpation but medial aspect with use. Denies any numbness or tingling.       Past Medical History:  Diagnosis Date   Abnormal uterine bleeding (AUB)    Beta thalassemia minor    02-17-2019  per pt dx as child at age 84, was tested due to family history   Crohn's disease ([place]) GI--- dr Collene Mares   no current med.----   s/p  colon resection 07/ 2007   Endometrial mass    GAD (generalized anxiety disorder)    HSV (herpes simplex virus) anogenital infection    HSV 2  (02-17-2019  per pt currently no flare)   TMJ syndrome    right side  (02-17-2019  per pt has had not issues in awhile)   Wears glasses    Past Surgical History:  Procedure Laterality Date   COLON RESECTION  07/ 2007--- age 85   terminal ileum and right colon w/ appendix   COLONOSCOPY WITH PROPOFOL  last one 05-09-2010   DILATATION & CURETTAGE/HYSTEROSCOPY WITH MYOSURE N/A 02/18/2019   Procedure: [place];  Surgeon: Servando Salina, MD;  Location: [facility];  Service: Gynecology;  Laterality: N/A;   LAPAROSCOPIC TUBAL LIGATION  Bilateral 11/07/2014   Procedure: LAPAROSCOPIC  BILATERAL TUBAL LIGATION with cautery and fulgeration of endmetrios;  Surgeon: Servando Salina, MD;  Location: [facility];  Service: Gynecology;  Laterality: Bilateral;   TONSILLECTOMY N/A 10/08/2013   Procedure: TONSILLECTOMY;  Surgeon: Rozetta Nunnery, MD;  Location: [facility];  Service: ENT;  Laterality: N/A;   WISDOM TOOTH EXTRACTION     Social History   Socioeconomic History   Marital status: Single    Spouse name: Not on file   Number of children: Not on file   Years of education: Not on file   Highest education level: Not on file  Occupational History   Not on file  Tobacco Use   Smoking status: Never   Smokeless tobacco: Never  Vaping Use   Vaping Use: Never used  Substance and Sexual Activity   Alcohol use: Yes    Alcohol/week: 1.0 - 2.0 standard drink    Types: 1 - 2 Glasses of wine per week    Comment: occasionally   Drug use: Never   Sexual activity: Yes    Birth control/protection: Surgical  Other Topics Concern   Not on file  Social History Narrative   2001- son   2014-daughter   73- daughter   Works as L and D nurse   Engaged   Has been married twice in the past.   Enjoys running, Control and instrumentation engineer   Social Determinants of Health  Financial Resource Strain: Not on file  Food Insecurity: Not on file  Transportation Needs: Not on file  Physical Activity: Not on file  Stress: Not on file  Social Connections: Not on file   No Known Allergies Family History  Problem Relation Age of Onset   Cancer Mother        breast   Hyperlipidemia Mother    Hypertension Mother    Allergic rhinitis Mother    Hypertension Father    Yves Dill Parkinson White syndrome Father    Heart disease Father    Hypertension Brother    Asthma Brother    Hypertension Maternal Grandmother    Anemia Maternal Grandmother        beta thalasemia minor   Hyperlipidemia Maternal Grandfather    Hypertension Maternal  Grandfather    Cancer Maternal Grandfather        colon   Heart disease Maternal Grandfather    Stroke Maternal Grandfather    Diabetes Paternal Grandmother    Cancer Paternal Grandmother        breast   Diabetes Paternal Grandfather    Allergic rhinitis Son    Angioedema Neg Hx    Eczema Neg Hx    Immunodeficiency Neg Hx    Urticaria Neg Hx        Current Outpatient Medications (Analgesics):    acetaminophen (TYLENOL) 325 MG tablet, Take 650 mg by mouth every 6 (six) hours as needed for mild pain.   rizatriptan (MAXALT) 10 MG tablet, TAKE 1 TABLET (10 MG TOTAL) BY MOUTH AS NEEDED FOR MIGRAINE. MAY REPEAT IN 2 HOURS IF NEEDED   Current Outpatient Medications (Other):    acyclovir ointment (ZOVIRAX) 5 %, APPLY 5 TIMES DAILY FOR 4 DAYS DURING FLARES.   escitalopram (LEXAPRO) 10 MG tablet, Take 1 tablet (10 mg total) by mouth daily.   Probiotic Product (PROBIOTIC PO), Take 1 tablet by mouth once a week.    valACYclovir (VALTREX) 500 MG tablet, TAKE 1 TABLET BY MOUTH ONCE DAILY (Patient taking differently: Take 500 mg by mouth daily as needed.)   zolpidem (AMBIEN) 5 MG tablet, TAKE 1 TABLET (5 MG TOTAL) BY MOUTH AT BEDTIME AS NEEDED FOR SLEEP.   Reviewed prior external information including notes and imaging from  primary care provider As well as notes that were available from care everywhere and other healthcare systems.  Past medical history, social, surgical and family history all reviewed in electronic medical record.  No pertanent information unless stated regarding to the chief complaint.   Review of Systems:  No headache, visual changes, nausea, vomiting, diarrhea, constipation, dizziness, abdominal pain, skin rash, fevers, chills, night sweats, weight loss, swollen lymph nodes, body aches, joint swelling, chest pain, shortness of breath, mood changes. POSITIVE muscle aches  Objective  Blood pressure 118/78, pulse 69, height 5' 3"  (1.6 m), weight 141 lb (64 kg), SpO2 99  %.   General: No apparent distress alert and oriented x3 mood and affect normal, dressed appropriately.  HEENT: Pupils equal, extraocular movements intact  Respiratory: Patient's speak in full sentences and does not appear short of breath  Cardiovascular: No lower extremity edema, non tender, no erythema  Gait normal with good balance and coordination.  MSK: Right elbow exam shows the patient does have tenderness to palpation of the lateral epicondylar region.  Pain with resisted wrist flexion noted.  Good grip strength noted though.  Full range of motion of the elbow otherwise noted.  Limited muscular skeletal ultrasound was performed  and interpreted by Hulan Saas, M  Limited ultrasound of patient's elbow area shows the patient does have what appears to be a split tear noted with hypoechoic changes within the common extensor tendon.  Mild increase in neovascularization in Doppler flow.  No retraction of the bone though noted.  No avulsion noted. Impression: Interstitial tear of the common extensor tendon    Impression and Recommendations:     The above documentation has been reviewed and is accurate and complete Lyndal Pulley, DO

## 2021-01-24 ENCOUNTER — Encounter: Payer: Self-pay | Admitting: Family Medicine

## 2021-01-24 ENCOUNTER — Ambulatory Visit (INDEPENDENT_AMBULATORY_CARE_PROVIDER_SITE_OTHER): Payer: No Typology Code available for payment source | Admitting: Family Medicine

## 2021-01-24 ENCOUNTER — Ambulatory Visit: Payer: Self-pay

## 2021-01-24 ENCOUNTER — Other Ambulatory Visit: Payer: Self-pay

## 2021-01-24 VITALS — BP 118/78 | HR 69 | Ht 63.0 in | Wt 141.0 lb

## 2021-01-24 DIAGNOSIS — M7711 Lateral epicondylitis, right elbow: Secondary | ICD-10-CM | POA: Diagnosis not present

## 2021-01-24 DIAGNOSIS — M25522 Pain in left elbow: Secondary | ICD-10-CM | POA: Diagnosis not present

## 2021-01-24 NOTE — Assessment & Plan Note (Signed)
Lateral epicondylitis.  Discussed with patient at great length.  Patient is going to avoid any type of overhead lifting.  Discussed other ergonomic changes that could be beneficial.  Discussed icing regimen and home exercises.  Increase activity slowly.  Worsening pain will need to consider the possibility of injections or formal physical therapy.  Follow-up with me again in 4 to 6 weeks

## 2021-01-24 NOTE — Patient Instructions (Signed)
Great to see you  Ice 20 minutes 2 times daily. Usually after activity and before bed. No overhand lifting at all  Get a vertical mouse.  Wrist brace day and night for 2 weeks then nightly for 2 weeks.  Voltaren gel or arnica lotion could help  See me again in 4-6 weeks

## 2021-02-02 ENCOUNTER — Other Ambulatory Visit (HOSPITAL_BASED_OUTPATIENT_CLINIC_OR_DEPARTMENT_OTHER): Payer: Self-pay

## 2021-02-08 ENCOUNTER — Other Ambulatory Visit: Payer: Self-pay | Admitting: *Deleted

## 2021-02-08 DIAGNOSIS — Z0184 Encounter for antibody response examination: Secondary | ICD-10-CM

## 2021-02-08 DIAGNOSIS — Z1159 Encounter for screening for other viral diseases: Secondary | ICD-10-CM

## 2021-02-15 ENCOUNTER — Other Ambulatory Visit: Payer: Self-pay

## 2021-02-15 ENCOUNTER — Ambulatory Visit (INDEPENDENT_AMBULATORY_CARE_PROVIDER_SITE_OTHER): Payer: No Typology Code available for payment source | Admitting: Family Medicine

## 2021-02-15 ENCOUNTER — Encounter: Payer: Self-pay | Admitting: Family Medicine

## 2021-02-15 VITALS — BP 98/78 | HR 81 | Temp 98.2°F | Resp 18 | Ht 63.0 in | Wt 139.2 lb

## 2021-02-15 DIAGNOSIS — Z1159 Encounter for screening for other viral diseases: Secondary | ICD-10-CM

## 2021-02-15 DIAGNOSIS — M25521 Pain in right elbow: Secondary | ICD-10-CM | POA: Diagnosis not present

## 2021-02-15 DIAGNOSIS — Z0289 Encounter for other administrative examinations: Secondary | ICD-10-CM

## 2021-02-15 NOTE — Progress Notes (Signed)
Chief Complaint  Patient presents with   School Forms    Subjective: Patient is a 45 y.o. female here for f/u.  Needs forms for school filled out and Hep B surf ab done. No recent travel or residence in Tb endemic areas.  Dealing with R elbow pain, slight tear of muscle attaching to lat epicondyle. Wrist bracing at work helps a little but she is doing other movements that affects her elbow. Stretches/exercises somewhat helpful. Is not using forearm strap.  Past Medical History:  Diagnosis Date   Abnormal uterine bleeding (AUB)    Beta thalassemia minor    02-17-2019  per pt dx as child at age 49, was tested due to family history   Crohn's disease ([place]) GI--- dr Collene Mares   no current med.----   s/p  colon resection 07/ 2007   Endometrial mass    GAD (generalized anxiety disorder)    HSV (herpes simplex virus) anogenital infection    HSV 2  (02-17-2019  per pt currently no flare)   TMJ syndrome    right side  (02-17-2019  per pt has had not issues in awhile)   Wears glasses     Objective: BP 98/78 (BP Location: Left Arm, Patient Position: Sitting, Cuff Size: Normal)   Pulse 81   Temp 98.2 F (36.8 C) (Oral)   Resp 18   Ht 5' 3"  (1.6 m)   Wt 139 lb 3.2 oz (63.1 kg)   SpO2 95%   BMI 24.66 kg/m  General: Awake, appears stated age Lungs: No accessory muscle use Psych: Age appropriate judgment and insight, normal affect and mood  Assessment and Plan: Encounter for completion of form with patient  Need for hepatitis B screening test - Plan: Hepatitis B surface antibody,qualitative  Right elbow pain  1/2. School form completed. 3.     Forearm strap rec'd and nighttime usage of strap/braces.  F/u as originally scheduled.  The patient voiced understanding and agreement to the plan.  [place], DO 02/15/21  1:50 PM

## 2021-02-15 NOTE — Patient Instructions (Signed)
Give [place] 2-3 business days to get the results of your labs back.   Ice/cold pack over area for 10-15 min twice daily.  Consider getting a forearm strap to help your elbow heal. Any brand would work, but Band-IT is an example. Wear it at night and at work. Consider wearing a wrist brace at night also.   Let us know if you need anything.

## 2021-02-16 LAB — HEPATITIS B SURFACE ANTIBODY,QUALITATIVE: Hep B S Ab: NONREACTIVE

## 2021-02-20 ENCOUNTER — Telehealth: Payer: Self-pay | Admitting: Family Medicine

## 2021-02-20 NOTE — Telephone Encounter (Signed)
The patient has [employer] and her referral for her seeing Dr Tamala Julian had expired sometime over the summer. The patient is needing PCP to write a letter stating she was seen by PCP on 01/16/21 and was ok to be seen by Dr. Tamala Julian on 01/24/21.

## 2021-02-21 ENCOUNTER — Encounter: Payer: Self-pay | Admitting: Family Medicine

## 2021-02-21 NOTE — Telephone Encounter (Signed)
Letter sent through mychart.

## 2021-02-22 ENCOUNTER — Ambulatory Visit: Payer: No Typology Code available for payment source | Admitting: Family Medicine

## 2021-03-01 ENCOUNTER — Other Ambulatory Visit (HOSPITAL_BASED_OUTPATIENT_CLINIC_OR_DEPARTMENT_OTHER): Payer: Self-pay

## 2021-03-09 ENCOUNTER — Encounter: Payer: No Typology Code available for payment source | Admitting: Family Medicine

## 2021-03-16 ENCOUNTER — Telehealth (INDEPENDENT_AMBULATORY_CARE_PROVIDER_SITE_OTHER): Payer: No Typology Code available for payment source | Admitting: Family Medicine

## 2021-03-16 ENCOUNTER — Other Ambulatory Visit (HOSPITAL_BASED_OUTPATIENT_CLINIC_OR_DEPARTMENT_OTHER): Payer: Self-pay

## 2021-03-16 ENCOUNTER — Encounter: Payer: Self-pay | Admitting: Family Medicine

## 2021-03-16 ENCOUNTER — Other Ambulatory Visit: Payer: Self-pay

## 2021-03-16 DIAGNOSIS — R058 Other specified cough: Secondary | ICD-10-CM | POA: Diagnosis not present

## 2021-03-16 MED ORDER — PSEUDOEPH-BROMPHEN-DM 30-2-10 MG/5ML PO SYRP
5.0000 mL | ORAL_SOLUTION | Freq: Four times a day (QID) | ORAL | 0 refills | Status: DC | PRN
  Filled 2021-03-16: qty 118, 6d supply, fill #0
  Filled 2021-03-28: qty 118, 6d supply, fill #1

## 2021-03-16 MED ORDER — BENZONATATE 200 MG PO CAPS
200.0000 mg | ORAL_CAPSULE | Freq: Two times a day (BID) | ORAL | 0 refills | Status: DC | PRN
  Filled 2021-03-16: qty 20, 10d supply, fill #0

## 2021-03-16 MED ORDER — HYDROCODONE BIT-HOMATROP MBR 5-1.5 MG/5ML PO SOLN
5.0000 mL | Freq: Every evening | ORAL | 0 refills | Status: DC | PRN
  Filled 2021-03-16: qty 120, 24d supply, fill #0

## 2021-03-16 NOTE — Progress Notes (Signed)
Chief Complaint  Patient presents with   Cough    Susan Montoya here for URI complaints. Due to COVID-19 pandemic, we are interacting via web portal for an electronic face-to-face visit. I verified patient's ID using 2 identifiers. Patient agreed to proceed with visit via this method. Patient is at home, I am at office. Patient and I are present for visit.   Duration: 1 week  Associated symptoms:  coughing; other URI s/s's resolved Denies: sinus congestion, sinus pain, rhinorrhea, itchy watery eyes, ear pain, ear drainage, sore throat, wheezing, shortness of breath, myalgia, and fevers, N/VD, loss of taste/smell Treatment to date: Mucinex Sick contacts: Yes Did not test for covid.   Past Medical History:  Diagnosis Date   Abnormal uterine bleeding (AUB)    Beta thalassemia minor    02-17-2019  per pt dx as child at age 45, was tested due to family history   Crohn's disease ([facility]) GI--- dr Collene Mares   no current med.----   s/p  colon resection 07/ 2007   Endometrial mass    GAD (generalized anxiety disorder)    HSV (herpes simplex virus) anogenital infection    HSV 2  (02-17-2019  per pt currently no flare)   TMJ syndrome    right side  (02-17-2019  per pt has had not issues in awhile)   Wears glasses     Objective No conversational dyspnea Age appropriate judgment and insight Nml affect and mood  Post-viral cough syndrome - Plan: brompheniramine-pseudoephedrine-DM 30-2-10 MG/5ML syrup, HYDROcodone bit-homatropine (HYCODAN) 5-1.5 MG/5ML syrup  Brom syrup doesn't cause drowsiness. Hycodan for nighttime use.  Continue to push fluids, practice good hand hygiene, cover mouth when coughing. F/u prn. If starting to experience fevers, shaking, or shortness of breath, seek immediate care. Pt voiced understanding and agreement to the plan.  [place], DO 03/16/21 8:21 AM

## 2021-03-28 ENCOUNTER — Other Ambulatory Visit (HOSPITAL_BASED_OUTPATIENT_CLINIC_OR_DEPARTMENT_OTHER): Payer: Self-pay

## 2021-03-28 ENCOUNTER — Other Ambulatory Visit: Payer: Self-pay | Admitting: Family Medicine

## 2021-03-28 DIAGNOSIS — R058 Other specified cough: Secondary | ICD-10-CM

## 2021-03-28 MED ORDER — PSEUDOEPH-BROMPHEN-DM 30-2-10 MG/5ML PO SYRP
5.0000 mL | ORAL_SOLUTION | Freq: Four times a day (QID) | ORAL | 0 refills | Status: DC | PRN
  Filled 2021-03-28: qty 120, 6d supply, fill #0

## 2021-03-30 ENCOUNTER — Encounter: Payer: Self-pay | Admitting: Family

## 2021-03-30 ENCOUNTER — Other Ambulatory Visit (HOSPITAL_BASED_OUTPATIENT_CLINIC_OR_DEPARTMENT_OTHER): Payer: Self-pay

## 2021-03-30 ENCOUNTER — Ambulatory Visit (INDEPENDENT_AMBULATORY_CARE_PROVIDER_SITE_OTHER): Payer: No Typology Code available for payment source | Admitting: Family

## 2021-03-30 VITALS — BP 100/60 | HR 95 | Temp 97.6°F | Ht 63.0 in | Wt 142.2 lb

## 2021-03-30 DIAGNOSIS — R300 Dysuria: Secondary | ICD-10-CM | POA: Diagnosis not present

## 2021-03-30 LAB — POCT URINALYSIS DIP (MANUAL ENTRY)
Bilirubin, UA: NEGATIVE
Blood, UA: NEGATIVE
Glucose, UA: NEGATIVE mg/dL
Ketones, POC UA: NEGATIVE mg/dL
Leukocytes, UA: NEGATIVE
Nitrite, UA: NEGATIVE
Protein Ur, POC: NEGATIVE mg/dL
Spec Grav, UA: >=1.03 — AB (ref 1.010–1.025)
Urobilinogen, UA: 0.2 E.U./dL
pH, UA: 5 (ref 5.0–8.0)

## 2021-03-30 MED ORDER — SULFAMETHOXAZOLE-TRIMETHOPRIM 800-160 MG PO TABS
1.0000 | ORAL_TABLET | Freq: Two times a day (BID) | ORAL | 0 refills | Status: DC
  Filled 2021-03-30: qty 10, 5d supply, fill #0

## 2021-03-30 NOTE — Progress Notes (Signed)
Susan Montoya is a 45 y.o. 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 30 tablet 5   Probiotic Product (PROBIOTIC PO) Take 1 tablet by mouth once a week.      rizatriptan (MAXALT) 10 MG tablet TAKE 1 TABLET (10 MG TOTAL) BY MOUTH AS NEEDED FOR MIGRAINE. MAY REPEAT IN 2 HOURS IF NEEDED 10 tablet 2   sulfamethoxazole-trimethoprim (BACTRIM DS) 800-160 MG tablet Take 1 tablet by mouth 2 (two) times daily. 10 tablet 0   valACYclovir (VALTREX) 500 MG tablet TAKE 1 TABLET BY MOUTH ONCE DAILY (Patient not taking: Reported on 03/30/2021) 30 tablet 3   zolpidem (AMBIEN) 5 MG tablet TAKE 1 TABLET (5 MG TOTAL) BY MOUTH AT BEDTIME AS NEEDED FOR SLEEP. (Patient not taking: Reported on 03/30/2021) 15 tablet 1   No current facility-administered medications for this visit.    Allergies: Patient has no known allergies.  Past Medical History:  Diagnosis Date   Abnormal uterine bleeding (AUB)    Beta thalassemia minor    02-17-2019  per pt dx as child at age 51, was  tested due to family history   Crohn's disease ([facility]) GI--- dr Collene Mares   no current med.----   s/p  colon resection 07/ 2007   Endometrial mass    GAD (generalized anxiety disorder)    HSV (herpes simplex virus) anogenital infection    HSV 2  (02-17-2019  per pt currently no flare)   TMJ syndrome    right side  (02-17-2019  per pt has had not issues in awhile)   Wears glasses     Past Surgical History:  Procedure Laterality Date   COLON RESECTION  07/ 2007--- age 56   terminal ileum and right colon w/ appendix   COLONOSCOPY WITH PROPOFOL  last one 05-09-2010   DILATATION & CURETTAGE/HYSTEROSCOPY WITH MYOSURE N/A 02/18/2019   Procedure: [place];  Surgeon: Servando Salina, MD;  Location: [facility];  Service: Gynecology;  Laterality: N/A;   LAPAROSCOPIC TUBAL LIGATION Bilateral 11/07/2014   Procedure: LAPAROSCOPIC  BILATERAL TUBAL LIGATION with cautery and fulgeration of endmetrios;  Surgeon: Servando Salina, MD;  Location: [facility];  Service: Gynecology;  Laterality: Bilateral;   TONSILLECTOMY N/A 10/08/2013   Procedure: TONSILLECTOMY;  Surgeon: Rozetta Nunnery, MD;  Location: [facility];  Service: ENT;  Laterality: N/A;   WISDOM TOOTH EXTRACTION      Family History  Problem Relation Age of Onset   Cancer Mother        breast   Hyperlipidemia Mother    Hypertension Mother    Allergic rhinitis Mother  Hypertension Father    Delorse Limber White syndrome Father    Heart disease Father    Hypertension Brother    Asthma Brother    Hypertension Maternal Grandmother    Anemia Maternal Grandmother        beta thalasemia minor   Hyperlipidemia Maternal Grandfather    Hypertension Maternal Grandfather    Cancer Maternal Grandfather        colon   Heart disease Maternal Grandfather    Stroke Maternal Grandfather    Diabetes Paternal Grandmother    Cancer Paternal Grandmother        breast   Diabetes  Paternal Grandfather    Allergic rhinitis Son    Angioedema Neg Hx    Eczema Neg Hx    Immunodeficiency Neg Hx    Urticaria Neg Hx     Social History   Tobacco Use   Smoking status: Never   Smokeless tobacco: Never  Substance Use Topics   Alcohol use: Yes    Alcohol/week: 1.0 - 2.0 standard drink    Types: 1 - 2 Glasses of wine per week    Comment: occasionally    Subjective:  Presents with concerns for possible UTI; symptoms present x 1 week; was initially getting benefit with OTC Azo; + burning, urgency,frequency;   LMP spotting/ IUD    Objective:  Vitals:   03/30/21 1119  BP: 100/60  Pulse: 95  Temp: 97.6 F (36.4 C)  TempSrc: Oral  SpO2: 97%  Weight: 142 lb 3.2 oz (64.5 kg)  Height: 5' 3"  (1.6 m)    General: Well developed, well nourished, in no acute distress  Skin : Warm and dry.  Head: Normocephalic and atraumatic  Lungs: Respirations unlabored;  Neurologic: Alert and oriented; speech intact; face symmetrical; moves all extremities well; CNII-XII intact without focal deficit   Assessment:  1. Dysuria     Plan:  Suspect UTI; check U/A and urine culture; Rx for Bactrim DS bid x 5 days; follow up to be determined.  This visit occurred during the SARS-CoV-2 public health emergency.  Safety protocols were in place, including screening questions prior to the visit, additional usage of staff PPE, and extensive cleaning of exam room while observing appropriate contact time as indicated for disinfecting solutions.    No follow-ups on file.  Orders Placed This Encounter  Procedures   Urine Culture   POCT urinalysis dipstick    Requested Prescriptions   Signed Prescriptions Disp Refills   sulfamethoxazole-trimethoprim (BACTRIM DS) 800-160 MG tablet 10 tablet 0    Sig: Take 1 tablet by mouth 2 (two) times daily.

## 2021-03-31 LAB — URINE CULTURE
MICRO NUMBER:: 12707316
Result:: NO GROWTH
SPECIMEN QUALITY:: ADEQUATE

## 2021-04-04 ENCOUNTER — Encounter: Payer: Self-pay | Admitting: Family Medicine

## 2021-04-04 ENCOUNTER — Ambulatory Visit (INDEPENDENT_AMBULATORY_CARE_PROVIDER_SITE_OTHER): Payer: No Typology Code available for payment source | Admitting: Family Medicine

## 2021-04-04 VITALS — BP 108/61 | HR 54 | Temp 97.9°F | Ht 63.0 in | Wt 141.4 lb

## 2021-04-04 DIAGNOSIS — Z0000 Encounter for general adult medical examination without abnormal findings: Secondary | ICD-10-CM

## 2021-04-04 DIAGNOSIS — Z23 Encounter for immunization: Secondary | ICD-10-CM

## 2021-04-04 LAB — COMPREHENSIVE METABOLIC PANEL
ALT: 11 U/L (ref 0–35)
AST: 16 U/L (ref 0–37)
Albumin: 4.6 g/dL (ref 3.5–5.2)
Alkaline Phosphatase: 68 U/L (ref 39–117)
BUN: 17 mg/dL (ref 6–23)
CO2: 26 mEq/L (ref 19–32)
Calcium: 9.9 mg/dL (ref 8.4–10.5)
Chloride: 102 mEq/L (ref 96–112)
Creatinine, Ser: 0.9 mg/dL (ref 0.40–1.20)
GFR: 77.04 mL/min (ref >60.00)
Glucose, Bld: 76 mg/dL (ref 70–99)
Potassium: 4.2 mEq/L (ref 3.5–5.1)
Sodium: 137 mEq/L (ref 135–145)
Total Bilirubin: 0.7 mg/dL (ref 0.2–1.2)
Total Protein: 7.8 g/dL (ref 6.0–8.3)

## 2021-04-04 LAB — LIPID PANEL
Cholesterol: 154 mg/dL (ref 0–200)
HDL: 90.9 mg/dL (ref >39.00)
LDL Cholesterol: 53 mg/dL (ref 0–99)
NonHDL: 63.13
Total CHOL/HDL Ratio: 2
Triglycerides: 53 mg/dL (ref 0.0–149.0)
VLDL: 10.6 mg/dL (ref 0.0–40.0)

## 2021-04-04 LAB — CBC
HCT: 33.4 % — ABNORMAL LOW (ref 36.0–46.0)
Hemoglobin: 10.3 g/dL — ABNORMAL LOW (ref 12.0–15.0)
MCHC: 31 g/dL (ref 30.0–36.0)
MCV: 63.3 fl — ABNORMAL LOW (ref 78.0–100.0)
Platelets: 322 10*3/uL (ref 150.0–400.0)
RBC: 5.28 Mil/uL — ABNORMAL HIGH (ref 3.87–5.11)
RDW: 15.5 % (ref 11.5–15.5)
WBC: 8 10*3/uL (ref 4.0–10.5)

## 2021-04-04 NOTE — Addendum Note (Signed)
Addended by: Sharon Seller B on: 04/04/2021 01:12 PM   Modules accepted: Orders

## 2021-04-04 NOTE — Patient Instructions (Addendum)
Give Susan Montoya 2-3 business days to get the results of your labs back.   Keep the diet clean and stay active.  I recommend getting the updated bivalent covid vaccination booster at your convenience.   Please get Susan Montoya a copy of your advanced directive.   Let Susan Montoya know if you need anything.

## 2021-04-04 NOTE — Progress Notes (Signed)
Chief Complaint  Patient presents with   Annual Exam     Well Woman Susan Montoya is here for a complete physical.   Her last physical was >1 year ago.  Current diet: in general, diet is fair. Current exercise: none. Weight is stable and she denies fatigue out of ordinary. Seatbelt? Yes Advanced directive? No  Health Maintenance Pap/HPV- Yes Mammogram- Yes Tetanus- Yes Hep C screening- Yes HIV screening- Yes  Past Medical History:  Diagnosis Date   Abnormal uterine bleeding (AUB)    Beta thalassemia minor    02-17-2019  per pt dx as child at age 58, was tested due to family history   Crohn's disease ([place]) GI--- dr Collene Mares   no current med.----   s/p  colon resection 07/ 2007   Endometrial mass    GAD (generalized anxiety disorder)    HSV (herpes simplex virus) anogenital infection    HSV 2  (02-17-2019  per pt currently no flare)   TMJ syndrome    right side  (02-17-2019  per pt has had not issues in awhile)   Wears glasses      Past Surgical History:  Procedure Laterality Date   COLON RESECTION  07/ 2007--- age 29   terminal ileum and right colon w/ appendix   COLONOSCOPY WITH PROPOFOL  last one 05-09-2010   DILATATION & CURETTAGE/HYSTEROSCOPY WITH MYOSURE N/A 02/18/2019   Procedure: DILATATION & CURETTAGE/HYSTEROSCOPY WITH MYOSURE;  Surgeon: Servando Salina, MD;  Location: [facility];  Service: Gynecology;  Laterality: N/A;   LAPAROSCOPIC TUBAL LIGATION Bilateral 11/07/2014   Procedure: LAPAROSCOPIC  BILATERAL TUBAL LIGATION with cautery and fulgeration of endmetrios;  Surgeon: Servando Salina, MD;  Location: [facility];  Service: Gynecology;  Laterality: Bilateral;   TONSILLECTOMY N/A 10/08/2013   Procedure: TONSILLECTOMY;  Surgeon: Rozetta Nunnery, MD;  Location: [facility];  Service: ENT;  Laterality: N/A;   WISDOM TOOTH EXTRACTION      Medications  Current Outpatient Medications on File Prior to Visit  Medication Sig Dispense  Refill   escitalopram (LEXAPRO) 10 MG tablet Take 1 tablet (10 mg total) by mouth daily. 30 tablet 5   Levonorgestrel ([name] IU) by Intrauterine route.     Probiotic Product (PROBIOTIC PO) Take 1 tablet by mouth once a week.      sulfamethoxazole-trimethoprim (BACTRIM DS) 800-160 MG tablet Take 1 tablet by mouth 2 (two) times daily. 10 tablet 0   valACYclovir (VALTREX) 500 MG tablet TAKE 1 TABLET BY MOUTH ONCE DAILY 30 tablet 3   rizatriptan (MAXALT) 10 MG tablet TAKE 1 TABLET (10 MG TOTAL) BY MOUTH AS NEEDED FOR MIGRAINE. MAY REPEAT IN 2 HOURS IF NEEDED 10 tablet 2   zolpidem (AMBIEN) 5 MG tablet TAKE 1 TABLET (5 MG TOTAL) BY MOUTH AT BEDTIME AS NEEDED FOR SLEEP. (Patient not taking: Reported on 03/30/2021) 15 tablet 1   [DISCONTINUED] fluvoxaMINE (LUVOX) 25 MG tablet Take 1 tablet (25 mg total) by mouth at bedtime. 30 tablet 2   Allergies No Known Allergies  Review of Systems: Constitutional:  no unexpected weight changes Eye:  no recent significant change in vision Ear/Nose/Mouth/Throat:  Ears:  no recent change in hearing Nose/Mouth/Throat:  no complaints of nasal congestion, no sore throat Cardiovascular: no chest pain Respiratory:  no shortness of breath Gastrointestinal:  no abdominal pain, no change in bowel habits GU:  Female: negative for dysuria or pelvic pain Musculoskeletal/Extremities:  no pain of the joints Integumentary (Skin/Breast):  no  abnormal skin lesions reported Neurologic:  no headaches Endocrine:  denies fatigue Hematologic/Lymphatic:  No areas of easy bleeding  Exam BP 108/61   Pulse (!) 54   Temp 97.9 F (36.6 C) (Oral)   Ht 5' 3"  (1.6 m)   Wt 141 lb 6 oz (64.1 kg)   SpO2 99%   BMI 25.04 kg/m  General:  well developed, well nourished, in no apparent distress Skin:  no significant moles, warts, or growths Head:  no masses, lesions, or tenderness Eyes:  pupils equal and round, sclera anicteric without injection Ears:  canals without lesions, TMs  shiny without retraction, no obvious effusion, no erythema Nose:  nares patent, septum midline, mucosa normal, and no drainage or sinus tenderness Throat/Pharynx:  lips and gingiva without lesion; tongue and uvula midline; non-inflamed pharynx; no exudates or postnasal drainage Neck: neck supple without adenopathy, thyromegaly, or masses Lungs:  clear to auscultation, breath sounds equal bilaterally, no respiratory distress Cardio:  regular rate and rhythm, no LE edema Abdomen:  abdomen soft, nontender; bowel sounds normal; no masses or organomegaly Genital: Defer to GYN Musculoskeletal:  symmetrical muscle groups noted without atrophy or deformity Extremities:  no clubbing, cyanosis, or edema, no deformities, no skin discoloration Neuro:  gait normal; deep tendon reflexes normal and symmetric Psych: well oriented with normal range of affect and appropriate judgment/insight  Assessment and Plan  Well adult exam - Plan: CBC, Comprehensive metabolic panel, Lipid panel   Well 45 y.o. female. Counseled on diet and exercise. Other orders as above. Bivalent covid vaccine rec'd.  1st Hepsilav today, 2nd in 1 mo.  Follow up 6 mo or prn. The patient voiced understanding and agreement to the plan.  [place], DO 04/04/21 1:06 PM

## 2021-04-11 ENCOUNTER — Encounter: Payer: Self-pay | Admitting: Family Medicine

## 2021-04-11 ENCOUNTER — Other Ambulatory Visit (HOSPITAL_BASED_OUTPATIENT_CLINIC_OR_DEPARTMENT_OTHER): Payer: Self-pay

## 2021-04-11 ENCOUNTER — Ambulatory Visit (INDEPENDENT_AMBULATORY_CARE_PROVIDER_SITE_OTHER): Payer: No Typology Code available for payment source | Admitting: Family Medicine

## 2021-04-11 VITALS — BP 109/71 | HR 75 | Temp 97.6°F | Ht 63.0 in | Wt 143.1 lb

## 2021-04-11 DIAGNOSIS — Z111 Encounter for screening for respiratory tuberculosis: Secondary | ICD-10-CM

## 2021-04-11 DIAGNOSIS — K5080 Crohn's disease of both small and large intestine without complications: Secondary | ICD-10-CM

## 2021-04-11 DIAGNOSIS — J069 Acute upper respiratory infection, unspecified: Secondary | ICD-10-CM | POA: Diagnosis not present

## 2021-04-11 DIAGNOSIS — Z021 Encounter for pre-employment examination: Secondary | ICD-10-CM

## 2021-04-11 DIAGNOSIS — R1032 Left lower quadrant pain: Secondary | ICD-10-CM | POA: Diagnosis not present

## 2021-04-11 MED ORDER — BENZONATATE 200 MG PO CAPS
200.0000 mg | ORAL_CAPSULE | Freq: Two times a day (BID) | ORAL | 0 refills | Status: DC | PRN
  Filled 2021-04-11: qty 20, 10d supply, fill #0

## 2021-04-11 NOTE — Patient Instructions (Addendum)
Continue to push fluids, practice good hand hygiene, and cover your mouth if you cough.  If you start having fevers, shaking or shortness of breath, seek immediate care.  OK to take Tylenol 1000 mg (2 extra strength tabs) or 975 mg (3 regular strength tabs) every 6 hours as needed.  Consider dextromethorphan. OK to cont Mucinex.  Someone will reach out regarding your scan.   Let us know if you need anything.

## 2021-04-11 NOTE — Progress Notes (Signed)
Chief Complaint  Patient presents with   Sore Throat   Cough    Headache     Susan Montoya here for URI complaints.  Duration: 5 days  Associated symptoms: sinus headache, sore throat, wheezing, myalgia, and coughing Denies: sinus congestion, sinus pain, rhinorrhea, itchy watery eyes, ear pain, ear drainage, shortness of breath, and fevers, N/V/D Treatment to date: Mucinex Sick contacts: Yes; daughter w flu like symptoms  Patient has a history of recurrent left lower quadrant abdominal pain.  She also has a history of Crohn's disease.  She saw her gynecologist earlier in the week who has ordered an ultrasound of her ovaries.  If that is normal, she is requesting a CT scan.  Past Medical History:  Diagnosis Date   Abnormal uterine bleeding (AUB)    Beta thalassemia minor    02-17-2019  per pt dx as child at age 45, was tested due to family history   Crohn's disease ([place]) GI--- dr Collene Mares   no current med.----   s/p  colon resection 07/ 2007   Endometrial mass    GAD (generalized anxiety disorder)    HSV (herpes simplex virus) anogenital infection    HSV 2  (02-17-2019  per pt currently no flare)   TMJ syndrome    right side  (02-17-2019  per pt has had not issues in awhile)   Wears glasses     Objective BP 109/71    Pulse 75    Temp 97.6 F (36.4 C) (Oral)    Ht 5' 3"  (1.6 m)    Wt 143 lb 2 oz (64.9 kg)    SpO2 94%    BMI 25.35 kg/m  General: Awake, alert, appears stated age HEENT: AT, [place], ears patent b/l and TM's neg, nares patent w/o discharge, pharynx pink and without exudates, MMM Neck: No masses or asymmetry Heart: RRR Lungs: CTAB, no accessory muscle use Psych: Age appropriate judgment and insight, normal mood and affect  Viral URI with cough  Left lower quadrant abdominal pain - Plan: CT Abdomen Pelvis W Contrast  Screening-pulmonary TB - Plan: QuantiFERON-TB Gold Plus  Pre-employment drug screening - Plan: DRUG MONITORING, PANEL 8 WITH CONFIRMATION,  URINE  Crohn's disease of both small and large intestine without complication (HCC) - Plan: CT Abdomen Pelvis W Contrast  Likely viral, add Tessalon Perles as needed.  Continue to push fluids, practice good hand hygiene, cover mouth when coughing. F/u prn. If starting to experience fevers, shaking, or shortness of breath, seek immediate care. Chronic, unstable.  Check CT abdomen/pelvis.  We will see if ultrasound is abnormal first. 3-4. Needs screening for school.  Pt voiced understanding and agreement to the plan.  [place], DO 04/11/21 9:57 AM

## 2021-04-13 LAB — DRUG MONITORING, PANEL 8 WITH CONFIRMATION, URINE
6 Acetylmorphine: NEGATIVE ng/mL (ref <10)
Alcohol Metabolites: NEGATIVE ng/mL (ref <500)
Amphetamines: NEGATIVE ng/mL (ref <500)
Benzodiazepines: NEGATIVE ng/mL (ref <100)
Buprenorphine, Urine: NEGATIVE ng/mL (ref <5)
Cocaine Metabolite: NEGATIVE ng/mL (ref <150)
Codeine: NEGATIVE ng/mL (ref <50)
Creatinine: 38.4 mg/dL (ref >=20.0)
Hydrocodone: NEGATIVE ng/mL (ref <50)
Hydromorphone: NEGATIVE ng/mL (ref <50)
MDMA: NEGATIVE ng/mL (ref <500)
Marijuana Metabolite: NEGATIVE ng/mL (ref <20)
Morphine: NEGATIVE ng/mL (ref <50)
Norhydrocodone: 117 ng/mL — ABNORMAL HIGH (ref <50)
Opiates: POSITIVE ng/mL — AB (ref <100)
Oxidant: NEGATIVE ug/mL (ref <200)
Oxycodone: NEGATIVE ng/mL (ref <100)
pH: 6.3 (ref 4.5–9.0)

## 2021-04-13 LAB — DM TEMPLATE

## 2021-04-14 ENCOUNTER — Encounter: Payer: Self-pay | Admitting: Family Medicine

## 2021-04-14 LAB — QUANTIFERON-TB GOLD PLUS
Mitogen-NIL: >10 IU/mL
NIL: 0.06 IU/mL
QuantiFERON-TB Gold Plus: NEGATIVE
TB1-NIL: <0 IU/mL
TB2-NIL: 0 IU/mL

## 2021-04-16 ENCOUNTER — Other Ambulatory Visit: Payer: Self-pay | Admitting: Family Medicine

## 2021-04-16 ENCOUNTER — Telehealth: Payer: Self-pay | Admitting: Family Medicine

## 2021-04-16 DIAGNOSIS — R058 Other specified cough: Secondary | ICD-10-CM

## 2021-04-16 MED ORDER — HYDROCODONE BIT-HOMATROP MBR 5-1.5 MG/5ML PO SOLN: 5.0000 mL | Freq: Every evening | ORAL | 0 refills | Status: DC | PRN

## 2021-04-16 NOTE — Telephone Encounter (Signed)
[facility]: [phone]  Wants to see if medication below will be an ongoing that pt will take and need refills of.Only asked due to this being second time rx has been refilled.  HYDROcodone bit-homatropine (HYCODAN) 5-1.5 MG/5ML syrup

## 2021-04-25 ENCOUNTER — Ambulatory Visit (HOSPITAL_BASED_OUTPATIENT_CLINIC_OR_DEPARTMENT_OTHER): Payer: No Typology Code available for payment source

## 2021-05-01 ENCOUNTER — Encounter: Payer: Self-pay | Admitting: Family Medicine

## 2021-05-01 NOTE — Telephone Encounter (Signed)
I have sent an email to [employer] and asked them to re-file claim with corrected code on it.  I will send patient a Mychart message so she will know her request is being handled.

## 2021-05-02 ENCOUNTER — Other Ambulatory Visit (HOSPITAL_BASED_OUTPATIENT_CLINIC_OR_DEPARTMENT_OTHER): Payer: Self-pay

## 2021-05-03 ENCOUNTER — Other Ambulatory Visit: Payer: Self-pay

## 2021-05-03 ENCOUNTER — Ambulatory Visit (HOSPITAL_BASED_OUTPATIENT_CLINIC_OR_DEPARTMENT_OTHER)
Admission: RE | Admit: 2021-05-03 | Discharge: 2021-05-03 | Disposition: A | Discharge status: Home / Self Care | Payer: No Typology Code available for payment source | Source: Ambulatory Visit | Attending: Family Medicine | Admitting: Family Medicine

## 2021-05-03 ENCOUNTER — Encounter (HOSPITAL_BASED_OUTPATIENT_CLINIC_OR_DEPARTMENT_OTHER): Payer: Self-pay

## 2021-05-03 DIAGNOSIS — K5080 Crohn's disease of both small and large intestine without complications: Secondary | ICD-10-CM | POA: Insufficient documentation

## 2021-05-03 DIAGNOSIS — R1032 Left lower quadrant pain: Secondary | ICD-10-CM | POA: Insufficient documentation

## 2021-05-03 MED ORDER — IOHEXOL 300 MG/ML  SOLN
100.0000 mL | Freq: Once | INTRAMUSCULAR | Status: AC | PRN
  Administered 2021-05-03: 100 mL via INTRAVENOUS

## 2021-05-04 ENCOUNTER — Encounter: Payer: Self-pay | Admitting: Family Medicine

## 2021-05-08 ENCOUNTER — Ambulatory Visit (INDEPENDENT_AMBULATORY_CARE_PROVIDER_SITE_OTHER): Payer: No Typology Code available for payment source

## 2021-05-08 DIAGNOSIS — Z23 Encounter for immunization: Secondary | ICD-10-CM | POA: Diagnosis not present

## 2021-05-08 NOTE — Progress Notes (Signed)
Patient came in today for second dose of Hepisalv-b , injection given right deltoid , tolerated injection well

## 2021-06-17 ENCOUNTER — Other Ambulatory Visit: Payer: Self-pay | Admitting: Family Medicine

## 2021-06-17 DIAGNOSIS — F411 Generalized anxiety disorder: Secondary | ICD-10-CM

## 2021-06-18 ENCOUNTER — Other Ambulatory Visit (HOSPITAL_COMMUNITY): Payer: Self-pay

## 2021-06-18 MED ORDER — ESCITALOPRAM OXALATE 10 MG PO TABS
10.0000 mg | ORAL_TABLET | Freq: Every day | ORAL | 5 refills | Status: DC
  Filled 2021-06-18: qty 30, 30d supply, fill #0
  Filled 2021-07-13: qty 30, 30d supply, fill #1

## 2021-07-13 ENCOUNTER — Other Ambulatory Visit (HOSPITAL_BASED_OUTPATIENT_CLINIC_OR_DEPARTMENT_OTHER): Payer: Self-pay

## 2021-08-14 ENCOUNTER — Ambulatory Visit: Payer: No Typology Code available for payment source | Admitting: Family Medicine

## 2021-08-15 ENCOUNTER — Other Ambulatory Visit (HOSPITAL_BASED_OUTPATIENT_CLINIC_OR_DEPARTMENT_OTHER): Payer: Self-pay

## 2021-08-15 MED ORDER — WEGOVY 0.5 MG/0.5ML ~~LOC~~ SOAJ
SUBCUTANEOUS | 0 refills | Status: DC
  Filled 2021-09-07: qty 2, 30d supply, fill #0
  Filled 2021-09-17: qty 2, 28d supply, fill #0

## 2021-08-15 MED ORDER — PHENTERMINE HCL 37.5 MG PO TABS
37.5000 mg | ORAL_TABLET | Freq: Every day | ORAL | 0 refills | Status: DC
  Filled 2021-08-15 – 2021-09-13 (×3): qty 30, 30d supply, fill #0

## 2021-08-16 ENCOUNTER — Ambulatory Visit (INDEPENDENT_AMBULATORY_CARE_PROVIDER_SITE_OTHER): Payer: No Typology Code available for payment source | Admitting: Adult Health

## 2021-08-16 ENCOUNTER — Other Ambulatory Visit (HOSPITAL_BASED_OUTPATIENT_CLINIC_OR_DEPARTMENT_OTHER): Payer: Self-pay

## 2021-08-16 ENCOUNTER — Encounter: Payer: Self-pay | Admitting: Adult Health

## 2021-08-16 VITALS — BP 111/77 | HR 78 | Ht 62.0 in | Wt 148.0 lb

## 2021-08-16 DIAGNOSIS — F411 Generalized anxiety disorder: Secondary | ICD-10-CM

## 2021-08-16 MED ORDER — ESCITALOPRAM OXALATE 10 MG PO TABS
10.0000 mg | ORAL_TABLET | Freq: Every day | ORAL | 3 refills | Status: DC
  Filled 2021-08-16: qty 90, 90d supply, fill #0
  Filled 2021-11-17: qty 90, 90d supply, fill #1
  Filled 2022-03-10: qty 90, 90d supply, fill #2
  Filled 2022-05-26: qty 90, 90d supply, fill #3

## 2021-08-16 MED ORDER — BUPROPION HCL ER (XL) 150 MG PO TB24
150.0000 mg | ORAL_TABLET | Freq: Every day | ORAL | 2 refills | Status: DC
  Filled 2021-08-16: qty 30, 30d supply, fill #0
  Filled 2021-09-09 – 2021-09-10 (×2): qty 30, 30d supply, fill #1
  Filled 2021-10-11: qty 30, 30d supply, fill #2

## 2021-08-16 NOTE — Progress Notes (Signed)
[facility] MD/PA/NP Initial Note ? ?08/16/2021 2:48 PM ?Susan Montoya  ?MRN:  [id number] ? ?Chief Complaint:  ? ?HPI: ? ?Patient seen for initial psychiatric evaluation. ? ?Describes mood today as "ok". Pleasant. Denies tearfulness. Mood symptoms - reports anxiety leading to depression. Feels irritable at times. Reports obsessional thoughts and acts. Mood is consistent. Currently taking Lexapro 58m daily and feels like it works well. Also feels like there is room for improvement, but has sexual dysfunction with increasing dose - is interested doing a trial of Wellbutrin. Stable interest and motivation. Taking medications as prescribed.  ?Energy levels stable. Active, does not have a regular exercise routine.  ?Enjoys some usual interests and activities. Single - in a relationship. Has 3 children - 22, 9, and 7. Spending time with family. ?Appetite adequate. Weight gain - 30 pounds. ?Sleeps well most nights. Averages 5 to 7hours. ?Focus and concentration stable. Completing tasks. Managing aspects of household. 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 ?Denies SI or HI.  ?Denies AH or VH. ? ?Previous medication trials: Paxil - teeth grinding, Lexapro, Zoloft - suicidal ideation, Buspar Effexor ? ?Visit Diagnosis: No diagnosis found. ? ?Past Psychiatric History: Denies psychiatric hospitalization.  ? ?Past Medical History:  ?Past Medical History:  ?Diagnosis Date  ? Abnormal uterine bleeding (AUB)   ? Beta thalassemia minor   ? 02-17-2019  per pt dx as child at age 46 was tested due to family history  ? [place]'s [place] dr mCollene Mares ? no current med.----   s/p  colon resection 07/ 2007  ? Endometrial mass   ? GAD (generalized anxiety disorder)   ? HSV (herpes simplex virus) anogenital infection   ? HSV 2  (02-17-2019  per pt currently no flare)  ? TMJ syndrome   ? right side  (02-17-2019  per pt has had not issues in awhile)  ? Wears glasses   ?  ?Past Surgical History:  ?Procedure Laterality Date  ? COLON  RESECTION  07/ 2007--- age 46 ? terminal ileum and right colon w/ appendix  ? COLONOSCOPY WITH PROPOFOL  last one 05-09-2010  ? DILATATION & CURETTAGE/HYSTEROSCOPY WITH MYOSURE N/A 02/18/2019  ? Procedure: [place]  Surgeon: CServando Salina MD;  [facility]  Service: Gynecology;  Laterality: N/A;  ? LAPAROSCOPIC TUBAL LIGATION Bilateral 11/07/2014  ? Procedure: LAPAROSCOPIC  BILATERAL TUBAL LIGATION with cautery and fulgeration of endmetrios;  Surgeon: SServando Salina MD;  [facility];  Service: Gynecology;  Laterality: Bilateral;  ? TONSILLECTOMY N/A 10/08/2013  ? Procedure: TONSILLECTOMY;  Surgeon: CRozetta Nunnery MD;  [facility]  Service: ENT;  Laterality: N/A;  ? WISDOM TOOTH EXTRACTION    ? ? ?Family Psychiatric History: Denies any family history of mental illness.  ? ?Family History:  ?Family History  ?Problem Relation Age of Onset  ? Cancer Mother   ?     breast  ? Hyperlipidemia Mother   ? Hypertension Mother   ? Allergic rhinitis Mother   ? Hypertension Father   ? [place] syndrome Father   ? Heart disease Father   ? Hypertension Brother   ? Asthma Brother   ? Hypertension Maternal Grandmother   ? Anemia Maternal Grandmother   ?     beta thalasemia minor  ? Hyperlipidemia Maternal Grandfather   ? Hypertension Maternal Grandfather   ? Cancer Maternal Grandfather   ?  colon  ? Heart disease Maternal Grandfather   ? Stroke Maternal Grandfather   ? Diabetes Paternal Grandmother   ? Cancer Paternal Grandmother   ?     breast  ? Diabetes Paternal Grandfather   ? Allergic rhinitis Son   ? Angioedema Neg Hx   ? Eczema Neg Hx   ? Immunodeficiency Neg Hx   ? Urticaria Neg Hx   ? ? ?Social History:  ?Social History  ? ?Socioeconomic History  ? Marital status: Single  ?  Spouse name: Not on file  ? Number of children: Not on file  ? Years of education: Not on file  ? Highest education level:  Not on file  ?Occupational History  ? Not on file  ?Tobacco Use  ? Smoking status: Never  ? Smokeless tobacco: Never  ?Vaping Use  ? Vaping Use: Never used  ?Substance and Sexual Activity  ? Alcohol use: Yes  ?  Alcohol/week: 1.0 - 2.0 standard drink  ?  Types: 1 - 2 Glasses of wine per week  ?  Comment: occasionally  ? Drug use: Never  ? Sexual activity: Yes  ?  Birth control/protection: Surgical  ?Other Topics Concern  ? Not on file  ?Social History Narrative  ? 2001- son  ? 2014-daughter  ? 1016- daughter  ? Works as L and D nurse  ? Engaged  ? Has been married twice in the past.  ? Enjoys running, baking  ? ?Social Determinants of Health  ? ?Financial Resource Strain: Not on file  ?Food Insecurity: Not on file  ?Transportation Needs: Not on file  ?Physical Activity: Not on file  ?Stress: Not on file  ?Social Connections: Not on file  ? ? ?Allergies: No Known Allergies ? ?Metabolic Disorder Labs: ?Lab Results  ?Component Value Date  ? HGBA1C 5.3 02/12/2017  ? ?No results found for: PROLACTIN ?Lab Results  ?Component Value Date  ? CHOL 154 04/04/2021  ? TRIG 53.0 04/04/2021  ? HDL 90.90 04/04/2021  ? CHOLHDL 2 04/04/2021  ? VLDL 10.6 04/04/2021  ? [place] 53 04/04/2021  ? [place] 35 03/06/2020  ? ?Lab Results  ?Component Value Date  ? TSH 1.09 06/06/2020  ? TSH 1.07 07/26/2015  ? ? ?Therapeutic Level Labs: ?No results found for: LITHIUM ?No results found for: VALPROATE ?No components found for:  CBMZ ? ?Current Medications: ?Current Outpatient Medications  ?Medication Sig Dispense Refill  ? benzonatate (TESSALON) 200 MG capsule Take 1 capsule (200 mg total) by mouth 2 (two) times daily as needed for cough. 20 capsule 0  ? escitalopram (LEXAPRO) 10 MG tablet Take 1 tablet (10 mg total) by mouth daily. 30 tablet 5  ? HYDROcodone bit-homatropine (HYCODAN) 5-1.5 MG/5ML syrup Take 5 mLs by mouth at bedtime as needed for cough. 120 mL 0  ? Levonorgestrel ([name] IU) by Intrauterine route.    ? phentermine (ADIPEX-P)  37.5 MG tablet Take 1 tablet (37.5 mg total) by mouth daily. 30 tablet 0  ? Probiotic Product (PROBIOTIC PO) Take 1 tablet by mouth once a week.     ? rizatriptan (MAXALT) 10 MG tablet TAKE 1 TABLET (10 MG TOTAL) BY MOUTH AS NEEDED FOR MIGRAINE. MAY REPEAT IN 2 HOURS IF NEEDED 10 tablet 2  ? [START ON 09/07/2021] Semaglutide-Weight Management (WEGOVY) 0.5 MG/0.5ML SOAJ Inject 0.5 mg every week by subcutaneous route for 28 days. 2 mL 0  ? valACYclovir (VALTREX) 500 MG tablet TAKE 1 TABLET BY MOUTH ONCE DAILY 30 tablet 3  ? ?  No current facility-administered medications for this visit.  ? ? ?Medication Side Effects: none ? ?Orders placed this visit:  No orders of the defined types were placed in this encounter. ? ? ?Psychiatric Specialty Exam: ? ?Review of Systems  ?Musculoskeletal:  Negative for gait problem.  ?Neurological:  Negative for tremors.  ?Psychiatric/Behavioral:    ?     Please refer to HPI   ?There were no vitals taken for this visit.There is no height or weight on file to calculate BMI.  ?General Appearance: Casual and Neat  ?Eye Contact:  Good  ?Speech:  Blocked and Normal Rate  ?Volume:  Normal  ?Mood:  Euthymic  ?Affect:  Appropriate and Congruent  ?Thought Process:  Coherent and Descriptions of Associations: Loose  ?Orientation:  Full (Time, Place, and Person)  ?Thought Content: Logical   ?Suicidal Thoughts:  No  ?Homicidal Thoughts:  No  ?Memory:  WNL  ?Judgement:  Good  ?Insight:  Good  ?Psychomotor Activity:  Normal  ?Concentration:  Concentration: Good  ?Recall:  Good  ?Fund of Knowledge: Good  ?Language: Good  ?Assets:  Communication Skills ?Desire for Improvement ?Financial Resources/Insurance ?Housing ?Intimacy ?Leisure Time ?Physical Health ?Resilience ?Social Support ?Talents/Skills ?Transportation ?Vocational/Educational  ?ADL's:  Intact  ?Cognition: WNL  ?Prognosis:  Good  ? ?Screenings:  ?PHQ2-9   ? ?[place] Office Visit from 03/30/2021 in [facility] [place] Visit from 12/05/2020 in [facility]  ?PHQ-2 Total Score 0 3  ?PHQ-9 Total Score -- 6  ? ?  ? ? ?Receiving Psychotherapy: Yes through [facility] ? ?Treatm

## 2021-09-04 ENCOUNTER — Telehealth: Payer: No Typology Code available for payment source | Admitting: Physician Assistant

## 2021-09-04 DIAGNOSIS — J069 Acute upper respiratory infection, unspecified: Secondary | ICD-10-CM

## 2021-09-05 ENCOUNTER — Other Ambulatory Visit (HOSPITAL_BASED_OUTPATIENT_CLINIC_OR_DEPARTMENT_OTHER): Payer: Self-pay

## 2021-09-05 MED ORDER — BENZONATATE 100 MG PO CAPS
100.0000 mg | ORAL_CAPSULE | Freq: Three times a day (TID) | ORAL | 0 refills | Status: DC | PRN
  Filled 2021-09-05: qty 30, 10d supply, fill #0

## 2021-09-05 MED ORDER — PREDNISONE 20 MG PO TABS
20.0000 mg | ORAL_TABLET | Freq: Every day | ORAL | 0 refills | Status: DC
Start: 2021-09-05 — End: 2021-11-02
  Filled 2021-09-05: qty 5, 5d supply, fill #0

## 2021-09-05 MED ORDER — IPRATROPIUM BROMIDE 0.03 % NA SOLN
2.0000 | Freq: Two times a day (BID) | NASAL | 0 refills | Status: DC
  Filled 2021-09-05: qty 30, 43d supply, fill #0

## 2021-09-05 NOTE — Addendum Note (Signed)
Addended by: Mar Daring on: 09/05/2021 07:24 AM ? ? Modules accepted: Orders ? ?

## 2021-09-05 NOTE — Progress Notes (Signed)

## 2021-09-07 ENCOUNTER — Other Ambulatory Visit (HOSPITAL_BASED_OUTPATIENT_CLINIC_OR_DEPARTMENT_OTHER): Payer: Self-pay

## 2021-09-07 ENCOUNTER — Other Ambulatory Visit (HOSPITAL_COMMUNITY): Payer: Self-pay

## 2021-09-10 ENCOUNTER — Other Ambulatory Visit (HOSPITAL_COMMUNITY): Payer: Self-pay

## 2021-09-13 ENCOUNTER — Other Ambulatory Visit (HOSPITAL_BASED_OUTPATIENT_CLINIC_OR_DEPARTMENT_OTHER): Payer: Self-pay

## 2021-09-14 ENCOUNTER — Other Ambulatory Visit (HOSPITAL_COMMUNITY): Payer: Self-pay

## 2021-09-17 ENCOUNTER — Other Ambulatory Visit (HOSPITAL_COMMUNITY): Payer: Self-pay

## 2021-09-18 ENCOUNTER — Other Ambulatory Visit (HOSPITAL_BASED_OUTPATIENT_CLINIC_OR_DEPARTMENT_OTHER): Payer: Self-pay

## 2021-09-20 ENCOUNTER — Other Ambulatory Visit (HOSPITAL_COMMUNITY): Payer: Self-pay

## 2021-09-21 ENCOUNTER — Ambulatory Visit: Payer: No Typology Code available for payment source | Admitting: Family Medicine

## 2021-09-22 ENCOUNTER — Other Ambulatory Visit (HOSPITAL_COMMUNITY): Payer: Self-pay

## 2021-10-01 ENCOUNTER — Other Ambulatory Visit (HOSPITAL_COMMUNITY): Payer: Self-pay

## 2021-10-03 ENCOUNTER — Other Ambulatory Visit (HOSPITAL_COMMUNITY): Payer: Self-pay

## 2021-10-03 MED ORDER — WEGOVY 0.5 MG/0.5ML ~~LOC~~ SOAJ
SUBCUTANEOUS | 0 refills | Status: DC
  Filled 2021-10-03 – 2021-10-11 (×2): qty 2, 28d supply, fill #0

## 2021-10-11 ENCOUNTER — Other Ambulatory Visit (HOSPITAL_COMMUNITY): Payer: Self-pay

## 2021-10-12 ENCOUNTER — Other Ambulatory Visit (HOSPITAL_COMMUNITY): Payer: Self-pay

## 2021-10-15 ENCOUNTER — Other Ambulatory Visit (HOSPITAL_COMMUNITY): Payer: Self-pay

## 2021-10-26 ENCOUNTER — Encounter (INDEPENDENT_AMBULATORY_CARE_PROVIDER_SITE_OTHER): Payer: No Typology Code available for payment source | Admitting: Family Medicine

## 2021-10-26 DIAGNOSIS — G43809 Other migraine, not intractable, without status migrainosus: Secondary | ICD-10-CM | POA: Diagnosis not present

## 2021-11-02 ENCOUNTER — Other Ambulatory Visit (HOSPITAL_COMMUNITY): Payer: Self-pay

## 2021-11-02 ENCOUNTER — Other Ambulatory Visit (HOSPITAL_BASED_OUTPATIENT_CLINIC_OR_DEPARTMENT_OTHER): Payer: Self-pay

## 2021-11-02 ENCOUNTER — Telehealth: Payer: Self-pay | Admitting: *Deleted

## 2021-11-02 DIAGNOSIS — G43909 Migraine, unspecified, not intractable, without status migrainosus: Secondary | ICD-10-CM | POA: Insufficient documentation

## 2021-11-02 MED ORDER — NURTEC 75 MG PO TBDP
ORAL_TABLET | ORAL | 1 refills | Status: DC
Start: 2021-11-02 — End: 2022-03-10
  Filled 2021-11-02: qty 16, 16d supply, fill #0
  Filled 2021-12-25: qty 8, 15d supply, fill #1
  Filled 2022-02-08: qty 8, 15d supply, fill #2

## 2021-11-02 NOTE — Telephone Encounter (Signed)
Prior auth started via cover my meds.  Awaiting determination.  Key: [facility]

## 2021-11-02 NOTE — Telephone Encounter (Signed)
Please see the MyChart message reply(ies) for my assessment and plan.  The patient gave consent for this Medical Advice Message and is aware that it may result in a bill to their insurance company as well as the possibility that this may result in a co-payment or deductible. They are an established patient, but are not seeking medical advice exclusively about a problem treated during an in person or video visit in the last 7 days. I did not recommend an in person or video visit within 7 days of my reply.  I spent a total of 12 minutes cumulative time within 7 days through [employer], DO

## 2021-11-05 NOTE — Telephone Encounter (Signed)
Received approval From 11/03/2021 to 05/02/2022. Patient/pharmacy informed

## 2021-11-13 ENCOUNTER — Other Ambulatory Visit (HOSPITAL_BASED_OUTPATIENT_CLINIC_OR_DEPARTMENT_OTHER): Payer: Self-pay

## 2021-11-13 ENCOUNTER — Ambulatory Visit (INDEPENDENT_AMBULATORY_CARE_PROVIDER_SITE_OTHER): Payer: No Typology Code available for payment source | Admitting: Adult Health

## 2021-11-13 ENCOUNTER — Encounter: Payer: Self-pay | Admitting: Adult Health

## 2021-11-13 DIAGNOSIS — F411 Generalized anxiety disorder: Secondary | ICD-10-CM | POA: Diagnosis not present

## 2021-11-13 MED ORDER — WEGOVY 1.7 MG/0.75ML ~~LOC~~ SOAJ
SUBCUTANEOUS | 1 refills | Status: DC
  Filled 2021-11-13: qty 3, 28d supply, fill #0
  Filled 2021-12-16: qty 3, 28d supply, fill #1

## 2021-11-13 NOTE — Progress Notes (Signed)
Susan Montoya [id number] 10/03/1975 46 y.o.  Subjective:   Patient ID:  Susan Montoya is a 46 y.o. (DOB December 07, 1975) female.  Chief Complaint: No chief complaint on file.   HPI Susan Montoya presents to the office today for follow-up of GAD.   Describes mood today as "ok". Pleasant. Denies tearfulness. Mood symptoms - denies anxiety and depression. Feels irritable at times. Reports decreased obsessional thoughts and acts. Mood is consistent. Currently taking Lexapro 31m daily and feels it works well. Stable interest and motivation. Taking medications as prescribed.  Energy levels stable. Active, does not have a regular exercise routine.  Enjoys some usual interests and activities. Single - in a relationship. Has 3 children - 22, 9, and 7. Spending time with family. Appetite adequate. Weight stable - 30 pounds. Sleeps well most nights. Averages 5 to 7 hours. Focus and concentration stable. Completing tasks. Managing aspects of household. 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 Denies SI or HI.  Denies AH or VH.  Previous medication trials: Paxil - teeth grinding, Lexapro, Zoloft - suicidal ideation, Buspar Effexor   PHQ2-9    Flowsheet Row [facility]  PHQ-2 Total Score 0 3  PHQ-9 Total Score -- 6        Review of Systems:  Review of Systems  Musculoskeletal:  Negative for gait problem.  Neurological:  Negative for tremors.  Psychiatric/Behavioral:         Please refer to HPI    Medications: I have reviewed the patient's current medications.  Current Outpatient Medications  Medication Sig Dispense Refill   buPROPion (WELLBUTRIN XL) 150 MG 24 hr tablet Take 1 tablet (150 mg total) by mouth daily. 30 tablet 2   escitalopram (LEXAPRO) 10 MG tablet Take 1 tablet (10 mg total) by mouth daily. 90 tablet 3    ipratropium (ATROVENT) 0.03 % nasal spray Place 2 sprays into both nostrils every 12 (twelve) hours. 30 mL 0   Levonorgestrel ([name] IU) by Intrauterine route.     Probiotic Product (PROBIOTIC PO) Take 1 tablet by mouth once a week.      Rimegepant Sulfate (NURTEC) 75 MG TBDP Take 1 tablet by mouth daily as needed for migraines. 16 tablet 1   Semaglutide-Weight Management (WEGOVY) 0.5 MG/0.5ML SOAJ Inject 0.5 mg every week into the skin for 28 days. 2 mL 0   Semaglutide-Weight Management (WEGOVY) 1.7 MG/0.75ML SOAJ Inject 1.7 mg every week by subcutaneous route as directed. 3 mL 1   valACYclovir (VALTREX) 500 MG tablet TAKE 1 TABLET BY MOUTH ONCE DAILY 30 tablet 3   No current facility-administered medications for this visit.    Medication Side Effects: None  Allergies: No Known Allergies  Past Medical History:  Diagnosis Date   Abnormal uterine bleeding (AUB)    Beta thalassemia minor    02-17-2019  per pt dx as child at age 89 was tested due to family history   [facility]'s disease ([facility]--- dr mCollene Mares  no current med.----   s/p  colon resection 07/ 2007   Endometrial mass    GAD (generalized anxiety disorder)    HSV (herpes simplex virus) anogenital infection    HSV 2  (02-17-2019  per pt currently no flare)   TMJ syndrome    right side  (02-17-2019  per pt has had  not issues in awhile)   Wears glasses     Past Medical History, Surgical history, Social history, and Family history were reviewed and updated as appropriate.   Please see review of systems for further details on the patient's review from today.   Objective:   Physical Exam:  There were no vitals taken for this visit.  Physical Exam Constitutional:      General: She is not in acute distress. Musculoskeletal:        General: No deformity.  Neurological:     Mental Status: She is alert and oriented to person, place, and time.     Coordination: Coordination normal.  Psychiatric:        Attention and  Perception: Attention and perception normal. She does not perceive auditory or visual hallucinations.        Mood and Affect: Mood normal. Mood is not anxious or depressed. Affect is not labile, blunt, angry or inappropriate.        Speech: Speech normal.        Behavior: Behavior normal.        Thought Content: Thought content normal. Thought content is not paranoid or delusional. Thought content does not include homicidal or suicidal ideation. Thought content does not include homicidal or suicidal plan.        Cognition and Memory: Cognition and memory normal.        Judgment: Judgment normal.     Comments: Insight intact     Lab Review:     Component Value Date/Time   NA 137 04/04/2021 1311   K 4.2 04/04/2021 1311   CL 102 04/04/2021 1311   CO2 26 04/04/2021 1311   GLUCOSE 76 04/04/2021 1311   BUN 17 04/04/2021 1311   CREATININE 0.90 04/04/2021 1311   CALCIUM 9.9 04/04/2021 1311   PROT 7.8 04/04/2021 1311   ALBUMIN 4.6 04/04/2021 1311   AST 16 04/04/2021 1311   ALT 11 04/04/2021 1311   ALKPHOS 68 04/04/2021 1311   BILITOT 0.7 04/04/2021 1311       Component Value Date/Time   WBC 8.0 04/04/2021 1311   RBC 5.28 (H) 04/04/2021 1311   HGB 10.3 (L) 04/04/2021 1311   HCT 33.4 (L) 04/04/2021 1311   PLT 322.0 04/04/2021 1311   MCV 63.3 Repeated and verified X2. (L) 04/04/2021 1311   MCH 20.0 (L) 11/01/2014 1545   MCHC 31.0 04/04/2021 1311   RDW 15.5 04/04/2021 1311   LYMPHSABS 1.6 07/26/2015 1100   MONOABS 0.3 07/26/2015 1100   EOSABS 0.1 07/26/2015 1100   BASOSABS 0.0 07/26/2015 1100    No results found for: "POCLITH", "LITHIUM"   No results found for: "PHENYTOIN", "PHENOBARB", "VALPROATE", "CBMZ"   .res Assessment: Plan:    Plan:  PDMP reviewed  Leapro 57m daily Add Wellbutrin XL 1564mevery morning  Time spent with patient was  minutes. Greater than 50% of face to face time with patient was spent on counseling and coordination of care.    RTC 4  weeks  Patient advised to contact office with any questions, adverse effects, or acute worsening in signs and symptoms.   Diagnoses and all orders for this visit:  GAD (generalized anxiety disorder)     Please see After Visit Summary for patient specific instructions.  No future appointments.  No orders of the defined types were placed in this encounter.   -------------------------------

## 2021-11-17 ENCOUNTER — Other Ambulatory Visit (HOSPITAL_COMMUNITY): Payer: Self-pay

## 2021-12-18 ENCOUNTER — Other Ambulatory Visit (HOSPITAL_COMMUNITY): Payer: Self-pay

## 2021-12-25 ENCOUNTER — Other Ambulatory Visit (HOSPITAL_BASED_OUTPATIENT_CLINIC_OR_DEPARTMENT_OTHER): Payer: Self-pay

## 2021-12-27 ENCOUNTER — Other Ambulatory Visit (HOSPITAL_BASED_OUTPATIENT_CLINIC_OR_DEPARTMENT_OTHER): Payer: Self-pay

## 2021-12-28 ENCOUNTER — Other Ambulatory Visit (HOSPITAL_BASED_OUTPATIENT_CLINIC_OR_DEPARTMENT_OTHER): Payer: Self-pay

## 2022-01-01 ENCOUNTER — Telehealth: Payer: Self-pay | Admitting: Family Medicine

## 2022-01-01 NOTE — Telephone Encounter (Signed)
Initiate [facility] Faxed completed for to [place].

## 2022-01-14 ENCOUNTER — Other Ambulatory Visit (HOSPITAL_BASED_OUTPATIENT_CLINIC_OR_DEPARTMENT_OTHER): Payer: Self-pay

## 2022-01-15 ENCOUNTER — Ambulatory Visit (INDEPENDENT_AMBULATORY_CARE_PROVIDER_SITE_OTHER): Payer: No Typology Code available for payment source | Admitting: Family Medicine

## 2022-01-15 ENCOUNTER — Other Ambulatory Visit (HOSPITAL_BASED_OUTPATIENT_CLINIC_OR_DEPARTMENT_OTHER): Payer: Self-pay

## 2022-01-15 ENCOUNTER — Encounter: Payer: Self-pay | Admitting: Family Medicine

## 2022-01-15 ENCOUNTER — Telehealth (HOSPITAL_BASED_OUTPATIENT_CLINIC_OR_DEPARTMENT_OTHER): Payer: Self-pay

## 2022-01-15 VITALS — BP 116/80 | HR 62 | Temp 97.6°F | Resp 18 | Ht 62.5 in | Wt 132.6 lb

## 2022-01-15 DIAGNOSIS — F410 Panic disorder [episodic paroxysmal anxiety] without agoraphobia: Secondary | ICD-10-CM

## 2022-01-15 DIAGNOSIS — K5080 Crohn's disease of both small and large intestine without complications: Secondary | ICD-10-CM | POA: Diagnosis not present

## 2022-01-15 DIAGNOSIS — R1030 Lower abdominal pain, unspecified: Secondary | ICD-10-CM

## 2022-01-15 MED ORDER — VALACYCLOVIR HCL 500 MG PO TABS
500.0000 mg | ORAL_TABLET | Freq: Two times a day (BID) | ORAL | 3 refills | Status: AC
  Filled 2022-01-15: qty 30, 15d supply, fill #0
  Filled 2022-03-10: qty 30, 15d supply, fill #1

## 2022-01-15 MED ORDER — ALPRAZOLAM 0.5 MG PO TABS
0.2500 mg | ORAL_TABLET | Freq: Every day | ORAL | 2 refills | Status: DC | PRN
  Filled 2022-01-15: qty 30, 30d supply, fill #0
  Filled 2022-02-08 – 2022-02-12 (×2): qty 30, 30d supply, fill #1
  Filled 2022-03-10: qty 30, 30d supply, fill #2

## 2022-01-15 MED ORDER — CREON 3000-9500 UNITS PO CPEP
ORAL_CAPSULE | ORAL | 2 refills | Status: DC
  Filled 2022-01-15: qty 90, 30d supply, fill #0
  Filled 2022-03-10: qty 90, 30d supply, fill #1

## 2022-01-15 NOTE — Patient Instructions (Addendum)
Give [place] 2-3 business days to get the results of your labs back.   Consider taking 2 Aleve twice daily for cramping.  Ice/cold pack over area for 10-15 min twice daily.  Heat (pad or rice pillow in microwave) over affected area, 10-15 minutes twice daily.   Stay hydrated.  Let us know if you need anything.

## 2022-01-15 NOTE — Progress Notes (Signed)
Chief Complaint  Patient presents with   Abdominal Pain    Pt says she started having abd pain sat night and wonders if she needs labs or imaging.     Susan Montoya is here for abdominal pain.  Duration: 3 days; started right after intercourse Felt like she did when her IUD was inserted.  LLQ- cramping and intermittent Nighttime awakenings? Yes Bleeding? No Weight loss? No Palliation: none Provocation: none Associated symptoms:  feels rectal pain intermittently as well Denies: fever, nausea, and vomiting Treatment to date: NSAIDs  Crohn's disease Patient has a history of Crohn's disease.  She has not done well with conventional treatment.  She has tried Creon as helpful for this.  She is interested in trying some.  No current diarrhea, bleeding, nausea, vomiting, or unexplained weight loss.  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 Abdomen: Bowel sounds are present. Abdomen is soft, TTP in suprapubic region, nondistended, no masses or  organomegaly. Negative Murphy's, Rovsing's, McBurney's, and Carnett's sign. Psych: Age appropriate judgment and insight. Normal mood and affect.  Lower abdominal pain - Plan: US Transvaginal Non-OB, US Pelvis Complete  Panic attacks - Plan: ALPRAZolam (XANAX) 0.5 MG tablet  Crohn's disease of both small and large intestine without complication (HCC) - Plan: Pancrelipase, Lip-Prot-Amyl, (CREON) 3000-9500 units CPEP  Acute, likely self-limiting.  Check ultrasound as above.  Unlikely to be infection.  Naproxen twice daily as needed. Refill Xanax. Chronic, uncontrolled.  Trial of Creon.  Could consider follow-up with GI if no improvement.  Also consider dose escalation. F/u prn Pt voiced understanding and agreement to the plan.  [place], DO 01/15/22 12:52 PM

## 2022-01-16 ENCOUNTER — Other Ambulatory Visit (HOSPITAL_BASED_OUTPATIENT_CLINIC_OR_DEPARTMENT_OTHER): Payer: Self-pay

## 2022-01-17 ENCOUNTER — Other Ambulatory Visit (HOSPITAL_BASED_OUTPATIENT_CLINIC_OR_DEPARTMENT_OTHER): Payer: Self-pay

## 2022-01-18 ENCOUNTER — Other Ambulatory Visit (HOSPITAL_BASED_OUTPATIENT_CLINIC_OR_DEPARTMENT_OTHER): Payer: Self-pay

## 2022-01-18 ENCOUNTER — Telehealth (HOSPITAL_BASED_OUTPATIENT_CLINIC_OR_DEPARTMENT_OTHER): Payer: Self-pay

## 2022-01-18 MED ORDER — SEMAGLUTIDE-WEIGHT MANAGEMENT 1.7 MG/0.75ML ~~LOC~~ SOAJ
SUBCUTANEOUS | 0 refills | Status: DC
  Filled 2022-01-18: qty 3, 28d supply, fill #0

## 2022-01-23 NOTE — Telephone Encounter (Signed)
Have had no response.

## 2022-01-30 ENCOUNTER — Other Ambulatory Visit (HOSPITAL_BASED_OUTPATIENT_CLINIC_OR_DEPARTMENT_OTHER): Payer: Self-pay

## 2022-01-31 ENCOUNTER — Ambulatory Visit (HOSPITAL_BASED_OUTPATIENT_CLINIC_OR_DEPARTMENT_OTHER): Payer: No Typology Code available for payment source

## 2022-02-07 ENCOUNTER — Ambulatory Visit (HOSPITAL_BASED_OUTPATIENT_CLINIC_OR_DEPARTMENT_OTHER): Payer: No Typology Code available for payment source

## 2022-02-08 ENCOUNTER — Other Ambulatory Visit (HOSPITAL_BASED_OUTPATIENT_CLINIC_OR_DEPARTMENT_OTHER): Payer: Self-pay

## 2022-02-12 ENCOUNTER — Other Ambulatory Visit (HOSPITAL_BASED_OUTPATIENT_CLINIC_OR_DEPARTMENT_OTHER): Payer: Self-pay

## 2022-02-12 ENCOUNTER — Other Ambulatory Visit (HOSPITAL_COMMUNITY): Payer: Self-pay

## 2022-02-12 MED ORDER — WEGOVY 1.7 MG/0.75ML ~~LOC~~ SOAJ
SUBCUTANEOUS | 0 refills | Status: DC
  Filled 2022-02-12: qty 3, 28d supply, fill #0

## 2022-02-20 ENCOUNTER — Encounter: Payer: Self-pay | Admitting: Family Medicine

## 2022-02-20 ENCOUNTER — Other Ambulatory Visit (HOSPITAL_BASED_OUTPATIENT_CLINIC_OR_DEPARTMENT_OTHER): Payer: Self-pay

## 2022-02-20 ENCOUNTER — Ambulatory Visit (INDEPENDENT_AMBULATORY_CARE_PROVIDER_SITE_OTHER): Payer: No Typology Code available for payment source | Admitting: Family Medicine

## 2022-02-20 VITALS — BP 104/62 | HR 80 | Temp 98.7°F | Ht 62.5 in | Wt 129.0 lb

## 2022-02-20 DIAGNOSIS — R202 Paresthesia of skin: Secondary | ICD-10-CM

## 2022-02-20 DIAGNOSIS — J302 Other seasonal allergic rhinitis: Secondary | ICD-10-CM

## 2022-02-20 LAB — MAGNESIUM: Magnesium: 1.9 mg/dL (ref 1.5–2.5)

## 2022-02-20 LAB — BASIC METABOLIC PANEL
BUN: 11 mg/dL (ref 6–23)
CO2: 27 mEq/L (ref 19–32)
Calcium: 9.3 mg/dL (ref 8.4–10.5)
Chloride: 105 mEq/L (ref 96–112)
Creatinine, Ser: 0.87 mg/dL (ref 0.40–1.20)
GFR: 79.74 mL/min (ref >60.00)
Glucose, Bld: 78 mg/dL (ref 70–99)
Potassium: 3.9 mEq/L (ref 3.5–5.1)
Sodium: 139 mEq/L (ref 135–145)

## 2022-02-20 LAB — VITAMIN B12: Vitamin B-12: 313 pg/mL (ref 211–911)

## 2022-02-20 MED ORDER — PREDNISONE 20 MG PO TABS
40.0000 mg | ORAL_TABLET | Freq: Every day | ORAL | 0 refills | Status: AC
  Filled 2022-02-20: qty 10, 5d supply, fill #0

## 2022-02-20 NOTE — Patient Instructions (Addendum)
Continue to push fluids, practice good hand hygiene, and cover your mouth if you cough.  If you start having fevers, shaking or shortness of breath, seek immediate care.  OK to take Tylenol 1000 mg (2 extra strength tabs) or 975 mg (3 regular strength tabs) every 6 hours as needed.  Ice/cold pack over area for 10-15 min twice daily.  Heat (pad or rice pillow in microwave) over affected area, 10-15 minutes twice daily.   Give [place] 2-3 business days to get the results of your labs back.   Let us know if you need anything.

## 2022-02-20 NOTE — Progress Notes (Signed)
Chief Complaint  Patient presents with   Numbness    Leg and elbow    Sore Throat   Headache    Susan Montoya here for URI complaints.  Duration: 2 days  Associated symptoms: subjective fever, sinus pain, rhinorrhea, sore throat, chest tightness, myalgia, and coughing Denies: sinus congestion, ear pain, ear drainage, wheezing, shortness of breath, and N/V Treatment to date: Mucinex, Tylenol Sick contacts: Yes; daughter was sick  She had neck pain a few days ago on the left.  No specific injury or change in activity but it did make her concerned for mono.  Around 2 weeks ago, she had decree sensation over the lateral right leg and thigh in addition to the elbow region.  No specific injury or change in activity.  She denies pain in her back or on the right side of her neck.  This has seemed to have resolved.  She is curious if she has a vitamin/electrolyte imbalance.  Past Medical History:  Diagnosis Date   Abnormal uterine bleeding (AUB)    Beta thalassemia minor    02-17-2019  per pt dx as child at age 39, was tested due to family history   Crohn's disease ([place]) GI--- dr Collene Mares   no current med.----   s/p  colon resection 07/ 2007   Endometrial mass    GAD (generalized anxiety disorder)    HSV (herpes simplex virus) anogenital infection    HSV 2  (02-17-2019  per pt currently no flare)   TMJ syndrome    right side  (02-17-2019  per pt has had not issues in awhile)   Wears glasses     Objective BP 104/62 (BP Location: Left Arm, Patient Position: Sitting, Cuff Size: Normal)   Pulse 80   Temp 98.7 F (37.1 C) (Oral)   Ht 5' 2.5" (1.588 m)   Wt 129 lb (58.5 kg)   SpO2 99%   BMI 23.22 kg/m  General: Awake, alert, appears stated age HEENT: AT, [place], ears patent b/l and TM's neg, nares patent w/o discharge, pharynx with scattered areas of erythema and without exudates, MMM, maxillary sinuses arms slightly TTP bilaterally Neck: No masses or asymmetry Heart: RRR Lungs: CTAB, no  accessory muscle use MSK: No TTP over the lumbar spine or SI joints bilaterally, no TTP over the neck or trapezius musculature bilaterally Neuro: Negative straight leg bilaterally, sensation intact to touch over the lower extremities bilaterally Psych: Age appropriate judgment and insight, normal mood and affect  Seasonal allergic rhinitis, unspecified trigger - Plan: predniSONE (DELTASONE) 20 MG tablet  Paresthesia - Plan: Magnesium, H20, Basic metabolic panel  Seems to be a yearly flare for her.  Prednisone 40 mg daily for 5 days.  Continue to push fluids, practice good hand hygiene, cover mouth when coughing. F/u prn. If starting to experience fevers, shaking, or shortness of breath, seek immediate care. This is resolved but we will check basic labs as above today. Pt voiced understanding and agreement to the plan.  [place], DO 02/20/22 12:14 PM

## 2022-03-05 ENCOUNTER — Other Ambulatory Visit (HOSPITAL_BASED_OUTPATIENT_CLINIC_OR_DEPARTMENT_OTHER): Payer: Self-pay

## 2022-03-05 MED ORDER — WEGOVY 1.7 MG/0.75ML ~~LOC~~ SOAJ
1.7000 mg | SUBCUTANEOUS | 2 refills | Status: DC
  Filled 2022-03-05 – 2022-03-06 (×2): qty 3, 28d supply, fill #0
  Filled 2022-04-05: qty 3, 28d supply, fill #1
  Filled 2022-05-03 – 2022-05-30 (×2): qty 3, 28d supply, fill #2

## 2022-03-06 ENCOUNTER — Other Ambulatory Visit (HOSPITAL_BASED_OUTPATIENT_CLINIC_OR_DEPARTMENT_OTHER): Payer: Self-pay

## 2022-03-10 ENCOUNTER — Other Ambulatory Visit: Payer: Self-pay | Admitting: Family Medicine

## 2022-03-11 ENCOUNTER — Other Ambulatory Visit (HOSPITAL_BASED_OUTPATIENT_CLINIC_OR_DEPARTMENT_OTHER): Payer: Self-pay

## 2022-03-11 MED ORDER — NURTEC 75 MG PO TBDP
ORAL_TABLET | ORAL | 1 refills | Status: DC
  Filled 2022-03-11: qty 16, 30d supply, fill #0
  Filled 2022-05-26: qty 16, 30d supply, fill #1

## 2022-03-12 ENCOUNTER — Other Ambulatory Visit (HOSPITAL_BASED_OUTPATIENT_CLINIC_OR_DEPARTMENT_OTHER): Payer: Self-pay

## 2022-03-13 ENCOUNTER — Other Ambulatory Visit (HOSPITAL_BASED_OUTPATIENT_CLINIC_OR_DEPARTMENT_OTHER): Payer: Self-pay

## 2022-04-05 ENCOUNTER — Other Ambulatory Visit (HOSPITAL_COMMUNITY): Payer: Self-pay

## 2022-04-08 ENCOUNTER — Encounter: Payer: No Typology Code available for payment source | Admitting: Family Medicine

## 2022-04-09 ENCOUNTER — Ambulatory Visit (INDEPENDENT_AMBULATORY_CARE_PROVIDER_SITE_OTHER): Payer: No Typology Code available for payment source | Admitting: Family Medicine

## 2022-04-09 ENCOUNTER — Encounter: Payer: Self-pay | Admitting: Family Medicine

## 2022-04-09 VITALS — BP 123/80 | HR 80 | Temp 97.4°F | Ht 63.0 in | Wt 128.5 lb

## 2022-04-09 DIAGNOSIS — R7989 Other specified abnormal findings of blood chemistry: Secondary | ICD-10-CM | POA: Diagnosis not present

## 2022-04-09 DIAGNOSIS — Z0000 Encounter for general adult medical examination without abnormal findings: Secondary | ICD-10-CM

## 2022-04-09 LAB — COMPREHENSIVE METABOLIC PANEL
ALT: 8 U/L (ref 0–35)
AST: 14 U/L (ref 0–37)
Albumin: 4.6 g/dL (ref 3.5–5.2)
Alkaline Phosphatase: 56 U/L (ref 39–117)
BUN: 11 mg/dL (ref 6–23)
CO2: 27 mEq/L (ref 19–32)
Calcium: 9.5 mg/dL (ref 8.4–10.5)
Chloride: 105 mEq/L (ref 96–112)
Creatinine, Ser: 0.69 mg/dL (ref 0.40–1.20)
GFR: 103.78 mL/min (ref >60.00)
Glucose, Bld: 82 mg/dL (ref 70–99)
Potassium: 4.3 mEq/L (ref 3.5–5.1)
Sodium: 138 mEq/L (ref 135–145)
Total Bilirubin: 1 mg/dL (ref 0.2–1.2)
Total Protein: 7.2 g/dL (ref 6.0–8.3)

## 2022-04-09 LAB — CBC
HCT: 32.5 % — ABNORMAL LOW (ref 36.0–46.0)
Hemoglobin: 10.5 g/dL — ABNORMAL LOW (ref 12.0–15.0)
MCHC: 32.2 g/dL (ref 30.0–36.0)
MCV: 63.2 fl — ABNORMAL LOW (ref 78.0–100.0)
Platelets: 293 10*3/uL (ref 150.0–400.0)
RBC: 5.15 Mil/uL — ABNORMAL HIGH (ref 3.87–5.11)
RDW: 16.1 % — ABNORMAL HIGH (ref 11.5–15.5)
WBC: 7 10*3/uL (ref 4.0–10.5)

## 2022-04-09 LAB — VITAMIN D 25 HYDROXY (VIT D DEFICIENCY, FRACTURES): VITD: 40.37 ng/mL (ref 30.00–100.00)

## 2022-04-09 LAB — LIPID PANEL
Cholesterol: 134 mg/dL (ref 0–200)
HDL: 77.6 mg/dL (ref >39.00)
LDL Cholesterol: 48 mg/dL (ref 0–99)
NonHDL: 56.46
Total CHOL/HDL Ratio: 2
Triglycerides: 40 mg/dL (ref 0.0–149.0)
VLDL: 8 mg/dL (ref 0.0–40.0)

## 2022-04-09 NOTE — Addendum Note (Signed)
Addended by: Ames Coupe on: 04/09/2022 11:14 AM   Modules accepted: Orders

## 2022-04-09 NOTE — Progress Notes (Signed)
Chief Complaint  Patient presents with   Annual Exam     Well Woman Susan Montoya is here for a complete physical.   Her last physical was >1 year ago.  Current diet: in general, diet is OK. Current exercise: none. Weight is intentionally decreasing and she denies fatigue out of ordinary. Seatbelt? Yes Advanced directive? yes  Health Maintenance Pap/HPV- Yes Mammogram- Due Tetanus- Yes Hep C screening- Yes HIV screening- Yes  Past Medical History:  Diagnosis Date   Abnormal uterine bleeding (AUB)    Beta thalassemia minor    02-17-2019  per pt dx as child at age 45, was tested due to family history   Crohn's disease ([place]) GI--- dr Collene Mares   no current med.----   s/p  colon resection 07/ 2007   Endometrial mass    GAD (generalized anxiety disorder)    HSV (herpes simplex virus) anogenital infection    HSV 2  (02-17-2019  per pt currently no flare)   TMJ syndrome    right side  (02-17-2019  per pt has had not issues in awhile)   Wears glasses      Past Surgical History:  Procedure Laterality Date   COLON RESECTION  07/ 2007--- age 89   terminal ileum and right colon w/ appendix   COLONOSCOPY WITH PROPOFOL  last one 05-09-2010   DILATATION & CURETTAGE/HYSTEROSCOPY WITH MYOSURE N/A 02/18/2019   Procedure: DILATATION & CURETTAGE/HYSTEROSCOPY WITH MYOSURE;  Surgeon: Servando Salina, MD;  Location: [facility] [facility];  Service: Gynecology;  Laterality: N/A;   LAPAROSCOPIC TUBAL LIGATION Bilateral 11/07/2014   Procedure: LAPAROSCOPIC  BILATERAL TUBAL LIGATION with cautery and fulgeration of endmetrios;  Surgeon: Servando Salina, MD;  Location: [facility];  Service: Gynecology;  Laterality: Bilateral;   TONSILLECTOMY N/A 10/08/2013   Procedure: TONSILLECTOMY;  Surgeon: Rozetta Nunnery, MD;  Location: [place];  Service: ENT;  Laterality: N/A;   WISDOM TOOTH EXTRACTION      Medications  Current Outpatient Medications on File Prior to Visit   Medication Sig Dispense Refill   ALPRAZolam (XANAX) 0.5 MG tablet Take 0.5-1 tablets (0.25-0.5 mg total) by mouth daily as needed. 30 tablet 2   escitalopram (LEXAPRO) 10 MG tablet Take 1 tablet (10 mg total) by mouth daily. 90 tablet 3   Levonorgestrel ([name] IU) by Intrauterine route.     Pancrelipase, Lip-Prot-Amyl, (CREON) 3000-9500 units CPEP Take 1 capsule by mouth with each meal. 180 capsule 2   Probiotic Product (PROBIOTIC PO) Take 1 tablet by mouth once a week.      Rimegepant Sulfate (NURTEC) 75 MG TBDP Take 1 tablet by mouth daily as needed for migraines. 16 tablet 1   Semaglutide-Weight Management (WEGOVY) 1.7 MG/0.75ML SOAJ Inject 1.7 mg into the skin once a week. 3 mL 2   valACYclovir (VALTREX) 500 MG tablet Take 1 tablet (500 mg total) by mouth 2 (two) times daily for 3 days for recurrences 30 tablet 3   [DISCONTINUED] fluvoxaMINE (LUVOX) 25 MG tablet Take 1 tablet (25 mg total) by mouth at bedtime. 30 tablet 2   No current facility-administered medications on file prior to visit.     Allergies No Known Allergies  Review of Systems: Constitutional:  no unexpected weight changes Eye:  no recent significant change in vision Ear/Nose/Mouth/Throat:  Ears:  no recent change in hearing Nose/Mouth/Throat:  no complaints of nasal congestion, no sore throat Cardiovascular: no chest pain Respiratory:  no shortness of breath Gastrointestinal:  no abdominal  pain, no change in bowel habits GU:  Female: negative for dysuria or pelvic pain Musculoskeletal/Extremities:  no pain of the joints Integumentary (Skin/Breast):  no abnormal skin lesions reported Neurologic:  no headaches Endocrine:  denies fatigue Hematologic/Lymphatic:  No areas of easy bleeding  Exam BP 123/80 (BP Location: Left Arm, Patient Position: Sitting, Cuff Size: Normal)   Pulse 80   Temp (!) 97.4 F (36.3 C) (Oral)   Ht 5' 3"  (1.6 m)   Wt 128 lb 8 oz (58.3 kg)   SpO2 99%   BMI 22.76 kg/m  General:   well developed, well nourished, in no apparent distress Skin:  no significant moles, warts, or growths Head:  no masses, lesions, or tenderness Eyes:  pupils equal and round, sclera anicteric without injection Ears:  canals without lesions, TMs shiny without retraction, no obvious effusion, no erythema Nose:  nares patent, mucosa normal, and no drainage Throat/Pharynx:  lips and gingiva without lesion; tongue and uvula midline; non-inflamed pharynx; no exudates or postnasal drainage Neck: neck supple without adenopathy, thyromegaly, or masses Lungs:  clear to auscultation, breath sounds equal bilaterally, no respiratory distress Cardio:  regular rate and rhythm, no LE edema Abdomen:  abdomen soft, nontender; bowel sounds normal; no masses or organomegaly Genital: Defer to GYN Musculoskeletal:  symmetrical muscle groups noted without atrophy or deformity Extremities:  no clubbing, cyanosis, or edema, no deformities, no skin discoloration Neuro:  gait normal; deep tendon reflexes normal and symmetric Psych: well oriented with normal range of affect and appropriate judgment/insight  Assessment and Plan  Well adult exam - Plan: CBC, Comprehensive metabolic panel, Lipid panel   Well 46 y.o. female. Counseled on diet and exercise. Advanced directive form requested today.  Other orders as above. Follow up in 6 mo. The patient voiced understanding and agreement to the plan.  [place], DO 04/09/22 10:49 AM

## 2022-04-09 NOTE — Patient Instructions (Signed)
Give [place] 2-3 business days to get the results of your labs back.   Keep the diet clean and stay active.  Please get me a copy of your advanced directive form at your convenience.   Let us know if you need anything.

## 2022-04-18 ENCOUNTER — Ambulatory Visit (HOSPITAL_BASED_OUTPATIENT_CLINIC_OR_DEPARTMENT_OTHER)
Admission: RE | Admit: 2022-04-18 | Discharge: 2022-04-18 | Disposition: A | Discharge status: Home / Self Care | Payer: No Typology Code available for payment source | Source: Ambulatory Visit | Attending: Family Medicine | Admitting: Family Medicine

## 2022-04-18 DIAGNOSIS — R1030 Lower abdominal pain, unspecified: Secondary | ICD-10-CM | POA: Diagnosis present

## 2022-04-25 ENCOUNTER — Encounter: Payer: Self-pay | Admitting: Family Medicine

## 2022-05-01 ENCOUNTER — Encounter: Payer: Self-pay | Admitting: Family Medicine

## 2022-05-01 ENCOUNTER — Ambulatory Visit (INDEPENDENT_AMBULATORY_CARE_PROVIDER_SITE_OTHER): Payer: 59 | Admitting: Family Medicine

## 2022-05-01 ENCOUNTER — Other Ambulatory Visit (HOSPITAL_BASED_OUTPATIENT_CLINIC_OR_DEPARTMENT_OTHER): Payer: Self-pay

## 2022-05-01 VITALS — BP 118/62 | HR 49 | Temp 98.0°F | Ht 62.5 in | Wt 129.5 lb

## 2022-05-01 DIAGNOSIS — S2232XA Fracture of one rib, left side, initial encounter for closed fracture: Secondary | ICD-10-CM | POA: Diagnosis not present

## 2022-05-01 MED ORDER — OXYCODONE HCL 5 MG PO TABS
5.0000 mg | ORAL_TABLET | Freq: Four times a day (QID) | ORAL | 0 refills | Status: DC | PRN
  Filled 2022-05-01: qty 15, 4d supply, fill #0

## 2022-05-01 NOTE — Patient Instructions (Signed)
Ice/cold pack over area for 10-15 min twice daily.  OK to take Tylenol 1000 mg (2 extra strength tabs) or 975 mg (3 regular strength tabs) every 6 hours as needed.  Do not drink alcohol, do any illicit/street drugs, drive or do anything that requires alertness while on this medicine.  Blow up a balloon every hour or use the incentive spirometer.    Let us know if you need anything.

## 2022-05-01 NOTE — Progress Notes (Signed)
Musculoskeletal Exam  Patient: Susan Montoya DOB: [birth year]  DOS: 05/01/2022  SUBJECTIVE:  Chief Complaint:   Chief Complaint  Patient presents with   Fall    FMLA    Susan Montoya is a 47 y.o.  female for evaluation and treatment of rib pain.   Onset:  1 week ago. No inj or change in activity.  Went to ED and x-ray showed a single fractured rib Location: Left upper back region Character:  aching and sharp  Progression of issue:  has improved Associated symptoms: Difficulty taking a deep breath and reaching/rotating No redness, bruising, shortness of breath, coughing, fevers, swelling. Treatment: to date has been ice, acetaminophen, lidocaine patches and oxycodone.   Neurovascular symptoms: no  Past Medical History:  Diagnosis Date   Abnormal uterine bleeding (AUB)    Beta thalassemia minor    02-17-2019  per pt dx as child at age 3, was tested due to family history   Crohn's disease ([place]) GI--- dr Collene Mares   no current med.----   s/p  colon resection 07/ 2007   Endometrial mass    GAD (generalized anxiety disorder)    HSV (herpes simplex virus) anogenital infection    HSV 2  (02-17-2019  per pt currently no flare)   TMJ syndrome    right side  (02-17-2019  per pt has had not issues in awhile)   Wears glasses     Objective: VITAL SIGNS: BP 118/62 (BP Location: Left Arm, Patient Position: Sitting, Cuff Size: Normal)   Pulse (!) 49   Temp 98 F (36.7 C) (Oral)   Ht 5' 2.5" (1.588 m)   Wt 129 lb 8 oz (58.7 kg)   SpO2 99%   BMI 23.31 kg/m  Constitutional: Well formed, well developed. No acute distress. Heart: Regular rhythm, bradycardic Thorax & Lungs: CTAB. No accessory muscle use Musculoskeletal: Left posterior ribs and thoracic region.   Tenderness to palpation: Yes over the medial portion of the rib near the left thoracic transverse process Deformity: no Ecchymosis: no Neurologic: Normal sensory function.  Gait is not normal. Psychiatric: Normal mood. Age  appropriate judgment and insight. Alert & oriented x 3.    Assessment:  Closed fracture of one rib of left side, initial encounter - Plan: oxyCODONE (OXY IR/ROXICODONE) 5 MG immediate release tablet  Plan: Will give another short supply of oxycodone.  Continue incentive spirometry.  Stretches, heat, ice, Tylenol, lidocaine patches.  Form for work filled out.  Will give her 6 weeks to recover.  She will likely be able to return sooner though. F/u as originally scheduled. The patient voiced understanding and agreement to the plan.   [place], DO 05/01/22  3:36 PM

## 2022-05-06 ENCOUNTER — Encounter: Payer: Self-pay | Admitting: Family Medicine

## 2022-05-06 NOTE — Telephone Encounter (Signed)
Patient came in and stated that her FMLA paperwork was missing some information.  Paperwork placed in red folder for provider to complete.  Patient would like for paperwork to be faxed to [phone].  Pt is a Therapist, sports  -need to fill out last question on pg 2. -pg 3 was missing and needed to be filled out.  Pt filled in some things.  I have a clear copy at my desk if needed -on the 2 pgs that pt filled out, it has her limitations and on the other pg it has things she can do since she cannot perform her job duties.  Please let me know if you have questions.

## 2022-05-27 ENCOUNTER — Other Ambulatory Visit (HOSPITAL_BASED_OUTPATIENT_CLINIC_OR_DEPARTMENT_OTHER): Payer: Self-pay

## 2022-05-27 ENCOUNTER — Telehealth: Payer: Self-pay

## 2022-05-27 NOTE — Telephone Encounter (Signed)
PA initiated via Covermymeds; KEY: [id number]. Awaiting determination.

## 2022-05-27 NOTE — Telephone Encounter (Signed)
Called left a detailed message of response/instructions.

## 2022-05-27 NOTE — Telephone Encounter (Signed)
PA approved.   The request has been approved. The authorization is effective from 05/27/2022 to 11/24/2022, as long as the member is enrolled in their current health plan. The request was approved with a quantity restriction. This has been approved for a max daily dosage of 0.6. A written notification letter will follow with additional details.

## 2022-05-28 ENCOUNTER — Other Ambulatory Visit (HOSPITAL_COMMUNITY): Payer: Self-pay

## 2022-05-30 ENCOUNTER — Other Ambulatory Visit (HOSPITAL_BASED_OUTPATIENT_CLINIC_OR_DEPARTMENT_OTHER): Payer: Self-pay

## 2022-05-30 ENCOUNTER — Other Ambulatory Visit: Payer: Self-pay

## 2022-05-31 ENCOUNTER — Other Ambulatory Visit (HOSPITAL_BASED_OUTPATIENT_CLINIC_OR_DEPARTMENT_OTHER): Payer: Self-pay

## 2022-06-03 ENCOUNTER — Other Ambulatory Visit (HOSPITAL_BASED_OUTPATIENT_CLINIC_OR_DEPARTMENT_OTHER): Payer: Self-pay

## 2022-06-03 DIAGNOSIS — N921 Excessive and frequent menstruation with irregular cycle: Secondary | ICD-10-CM | POA: Diagnosis not present

## 2022-06-03 DIAGNOSIS — K5090 Crohn's disease, unspecified, without complications: Secondary | ICD-10-CM | POA: Diagnosis not present

## 2022-06-03 DIAGNOSIS — R87612 Low grade squamous intraepithelial lesion on cytologic smear of cervix (LGSIL): Secondary | ICD-10-CM | POA: Diagnosis not present

## 2022-06-03 DIAGNOSIS — F419 Anxiety disorder, unspecified: Secondary | ICD-10-CM | POA: Diagnosis not present

## 2022-06-03 DIAGNOSIS — Z01419 Encounter for gynecological examination (general) (routine) without abnormal findings: Secondary | ICD-10-CM | POA: Diagnosis not present

## 2022-06-03 DIAGNOSIS — Z1231 Encounter for screening mammogram for malignant neoplasm of breast: Secondary | ICD-10-CM | POA: Diagnosis not present

## 2022-06-03 DIAGNOSIS — N941 Unspecified dyspareunia: Secondary | ICD-10-CM | POA: Diagnosis not present

## 2022-06-03 DIAGNOSIS — Z30431 Encounter for routine checking of intrauterine contraceptive device: Secondary | ICD-10-CM | POA: Diagnosis not present

## 2022-06-03 LAB — HM MAMMOGRAPHY: HM Mammogram: NORMAL (ref 0–4)

## 2022-06-03 LAB — HM PAP SMEAR

## 2022-06-04 ENCOUNTER — Other Ambulatory Visit (HOSPITAL_BASED_OUTPATIENT_CLINIC_OR_DEPARTMENT_OTHER): Payer: Self-pay

## 2022-06-05 ENCOUNTER — Other Ambulatory Visit (HOSPITAL_BASED_OUTPATIENT_CLINIC_OR_DEPARTMENT_OTHER): Payer: Self-pay

## 2022-06-06 ENCOUNTER — Other Ambulatory Visit (HOSPITAL_BASED_OUTPATIENT_CLINIC_OR_DEPARTMENT_OTHER): Payer: Self-pay

## 2022-06-07 ENCOUNTER — Other Ambulatory Visit (HOSPITAL_BASED_OUTPATIENT_CLINIC_OR_DEPARTMENT_OTHER): Payer: Self-pay

## 2022-06-10 ENCOUNTER — Other Ambulatory Visit (HOSPITAL_BASED_OUTPATIENT_CLINIC_OR_DEPARTMENT_OTHER): Payer: Self-pay

## 2022-06-11 ENCOUNTER — Other Ambulatory Visit (HOSPITAL_BASED_OUTPATIENT_CLINIC_OR_DEPARTMENT_OTHER): Payer: Self-pay

## 2022-06-15 ENCOUNTER — Encounter: Payer: Self-pay | Admitting: Family Medicine

## 2022-06-17 ENCOUNTER — Other Ambulatory Visit: Payer: Self-pay | Admitting: Family Medicine

## 2022-06-17 ENCOUNTER — Encounter: Payer: Self-pay | Admitting: Family Medicine

## 2022-06-17 DIAGNOSIS — R1030 Lower abdominal pain, unspecified: Secondary | ICD-10-CM

## 2022-06-17 DIAGNOSIS — Z1283 Encounter for screening for malignant neoplasm of skin: Secondary | ICD-10-CM

## 2022-07-01 ENCOUNTER — Other Ambulatory Visit (HOSPITAL_BASED_OUTPATIENT_CLINIC_OR_DEPARTMENT_OTHER): Payer: Self-pay

## 2022-07-18 NOTE — Therapy (Signed)
OUTPATIENT PHYSICAL THERAPY FEMALE PELVIC EVALUATION   Patient Name: Susan Montoya MRN: [id number] DOB:[birth year], 47 y.o., female Today's Date: 07/19/2022  END OF SESSION:  PT End of Session - 07/19/22 1003     Visit Number 1    Date for PT Re-Evaluation 10/11/22    Authorization Type cone    PT Start Time 0800    PT Stop Time 0845    PT Time Calculation (min) 45 min    Activity Tolerance Patient tolerated treatment well    Behavior During Therapy [facility] for tasks assessed/performed             Past Medical History:  Diagnosis Date   Abnormal uterine bleeding (AUB)    Beta thalassemia minor    02-17-2019  per pt dx as child at age 37, was tested due to family history   Crohn's disease ([place]) GI--- dr Collene Mares   no current med.----   s/p  colon resection 07/ 2007   Endometrial mass    GAD (generalized anxiety disorder)    HSV (herpes simplex virus) anogenital infection    HSV 2  (02-17-2019  per pt currently no flare)   TMJ syndrome    right side  (02-17-2019  per pt has had not issues in awhile)   Wears glasses    Past Surgical History:  Procedure Laterality Date   COLON RESECTION  07/ 2007--- age 86   terminal ileum and right colon w/ appendix   COLONOSCOPY WITH PROPOFOL  last one 05-09-2010   DILATATION & CURETTAGE/HYSTEROSCOPY WITH MYOSURE N/A 02/18/2019   Procedure: DILATATION & CURETTAGE/HYSTEROSCOPY WITH MYOSURE;  Surgeon: Servando Salina, MD;  Location: [place];  Service: Gynecology;  Laterality: N/A;   LAPAROSCOPIC TUBAL LIGATION Bilateral 11/07/2014   Procedure: LAPAROSCOPIC  BILATERAL TUBAL LIGATION with cautery and fulgeration of endmetrios;  Surgeon: Servando Salina, MD;  Location: [facility];  Service: Gynecology;  Laterality: Bilateral;   TONSILLECTOMY N/A 10/08/2013   Procedure: TONSILLECTOMY;  Surgeon: Rozetta Nunnery, MD;  Location: [place];  Service: ENT;  Laterality: N/A;   [place] EXTRACTION     Patient  Active Problem List   Diagnosis Date Noted   Migraine 11/02/2021   Lateral epicondylitis, right elbow 01/24/2021   Nonallopathic lesion of sacral region 02/09/2020   Hip flexor tendon tightness, unspecified laterality 12/17/2019   Seasonal allergic rhinitis due to pollen 07/08/2018   Seasonal allergic conjunctivitis 07/08/2018   Crohn's disease of both small and large intestine without complication ([place]) [id number]   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   Herpes simplex type 2 infection 06/20/2015   GAD (generalized anxiety disorder) 06/20/2015   Beta-thalassemia ([place]) 08/18/2014   Postpartum care following vaginal delivery (4/19) 08/16/2014   Decreased fetal movement during pregnancy in third trimester, antepartum 08/07/2014   Irregular uterine contractions 08/07/2014   Crohn's disease ([place]) 09/22/2007    PCP: Shelda Pal, DO  REFERRING PROVIDER: Servando Salina, MD  REFERRING DIAG: Dyspareunia in female N94.10  THERAPY DIAG:  Muscle weakness (generalized)  Pelvic pain  Right lower quadrant pain  Other muscle spasm  Rationale for Evaluation and Treatment: Rehabilitation  ONSET DATE: 2020  SUBJECTIVE:  SUBJECTIVE STATEMENT: Patient had a procedure to reduce the pain. I have scar tissue from Chron's surgery.   PAIN:  Are you having pain? Yes NPRS scale: 5/10 average but worse is 10/10 Pain location:  internal on the right side  Pain type: spasm Pain description: intermittent   Aggravating factors: deep penetration, lift something too heavy, defecation Relieving factors: stop the activity, heat sometimes  PRECAUTIONS: None  WEIGHT BEARING RESTRICTIONS: No  FALLS:  Has patient fallen in last 6 months? Yes. Number of falls fell down the stairs due to stairs being  slippery  LIVING ENVIRONMENT: Lives with: lives with their family  OCCUPATION: labor and delivery nurse  PLOF: Independent  PATIENT GOALS: reduce pain  PERTINENT HISTORY:  Colon Resection by the ileocecal valve removing large and small intestines and appendix; TMJ; Chron's disease; Endometrial mass   BOWEL MOVEMENT: Pain with bowel movement: Yes Type of bowel movement:Type (Bristol Stool Scale) loose, Frequency daily , Strain No, and Splinting no Fully empty rectum: Yes:   Leakage: No Pads: No Fiber supplement: Yes: probiotic  URINATION: Pain with urination: No Fully empty bladder: Yes:   Stream: Strong Urgency: No Frequency: average Leakage: Coughing and Sneezing   INTERCOURSE: Pain with intercourse: During Penetration with deep penetration Ability to have vaginal penetration:  Yes:   Climax: not with partner, able to climax with external stimulation like a vibrator Marinoff Scale: 1/3  PREGNANCY: Vaginal deliveries 3 Tearing Yes: and had stitches  PROLAPSE: None   OBJECTIVE:   DIAGNOSTIC FINDINGS:  CT was negative   COGNITION: Overall cognitive status: Within functional limits for tasks assessed     SENSATION: Light touch: Appears intact Proprioception: Appears intact   POSTURE: No Significant postural limitations  PELVIC ALIGNMENT: Pelvis in correct alignment  LUMBARAROM/PROM:  A/PROM A/PROM  eval  Left lateral flexion Decreased by 25%  Left rotation Decreased by 25%   (Blank rows = not tested)  LOWER EXTREMITY [id number] hip ROM is full   LOWER EXTREMITY MMT:  MMT Right eval Left eval  Hip extension 4/5 4/5  Hip abduction 4/5 4-/5   PALPATION:   General  decreased tissue mobility of the right abdominal, bulges the lower abdomen when contracting the abdominals,                 External Perineal Exam tightness in the perineal body with decreased mobility                             Internal Pelvic Floor restrictions on the  right side of the bladder, along the ovary, restrictions of the right side of the cervix, decreased contraction of the right urethra sphincter, tightness of the right introitus and posterior fourchette, decreased mobility of the right iliococcygeus and obturator internist  Patient confirms identification and approves PT to assess internal pelvic floor and treatment Yes  PELVIC MMT:   MMT eval  Vaginal 3/5 with weak hug  (Blank rows = not tested)        TONE: Increased on the right    TODAY'S TREATMENT:  DATE: 07/19/22  EVAL See below   PATIENT EDUCATION:  Education details: educated patient on perineal massage and posterior vaginal canal Person educated: Patient Education method: Customer service manager Education comprehension: verbalized understanding  HOME EXERCISE PROGRAM: See above  ASSESSMENT:  CLINICAL IMPRESSION: Patient is a 47 y.o. female who was seen today for physical therapy evaluation and treatment for dyspareunia. Patient pain started 2020. Patient pain is intermittent at level 5/10 average but worse is 10/10. Pain is located on the right lower quadrant and right side internal vaginal canal. She has pain with deep penetration, defecation and lifting. Tenderness located in the right lower quadrant and decreased right abdominal tissue mobility. She has a scar from surgery to remove part of large and small intestines, ileocecal valve and appendix. Pelvic floor strength is 3/5 with a weak hug. She has decreased mobility of the perineal body where she tore giving birth. The posterior introitus has limited mobility. Palpable restrictions on the right side of the bladder, along the ovary, restrictions of the right side of the cervix. Decreased contraction of the right urethra sphincter. Tightness of the right introitus and posterior fourchette.  Decreased mobility of the right iliococcygeus and obturator internist. Patient will benefit from skilled therapy to improve tissue mobility to reduce her pain and improve quality of life.   OBJECTIVE IMPAIRMENTS: decreased activity tolerance, decreased coordination, decreased strength, increased fascial restrictions, increased muscle spasms, and pain.   ACTIVITY LIMITATIONS: lifting and continence  PARTICIPATION LIMITATIONS: cleaning, interpersonal relationship, shopping, community activity, and occupation  PERSONAL FACTORS: Fitness, Past/current experiences, Time since onset of injury/illness/exacerbation, and 3+ comorbidities: Colon Resection by the ileocecal valve removing large and small intestines and appendix; TMJ; Chron's disease; Endometrial mass  are also affecting patient's functional outcome.   REHAB POTENTIAL: Excellent  CLINICAL DECISION MAKING: Evolving/moderate complexity  EVALUATION COMPLEXITY: Moderate   GOALS: Goals reviewed with patient? Yes  SHORT TERM GOALS: Target date: 08/15/22  Patient independent with perineal massage.  Baseline: Goal status: INITIAL  2.  Patient independent with manual work on the right lower quadrant to improve tissue mobility.  Baseline:  Goal status: INITIAL  3.  Patient independent with HEP for stretches and diaphragmatic breathing.  Baseline:  Goal status: INITIAL  4.  Patient able to contract the lower abdominal without bulging.  Baseline:  Goal status: INITIAL    LONG TERM GOALS: Target date: 10/11/22  Patient independent with advanced HEP.  Baseline:  Goal status: INITIAL  2.  Patient able to have penile penetration vaginally with no pain due to improved mobility of the tissue.  Baseline:  Goal status: INITIAL  3.  Pelvic floor strength >/= 4/5 holding for 10 seconds due to improved perineal body mobility and introitus.  Baseline:  Goal status: INITIAL  4.  Patient able to lift items with correct body mechanics  with no pain due to improve lower abdominal contraction.  Baseline:  Goal status: INITIAL  5.  Patient is able to have a bowel movement with correct toileting technique and no pain due to improve tissue mobility.  Baseline:  Goal status: INITIAL   PLAN:  PT FREQUENCY: 1x/week  PT DURATION: 12 weeks  PLANNED INTERVENTIONS: Therapeutic exercises, Therapeutic activity, Neuromuscular re-education, Patient/Family education, Joint mobilization, Dry Needling, Electrical stimulation, Spinal mobilization, Cryotherapy, Moist heat, scar mobilization, Ultrasound, Biofeedback, and Manual therapy  PLAN FOR NEXT SESSION: manual work to the pelvic floor and right side of the bladder, work on upper abdominal tissue and diaphragm, work on abdominal contraction  for lower.    Earlie Counts, PT 07/19/22 12:10 PM

## 2022-07-19 ENCOUNTER — Encounter: Payer: Self-pay | Admitting: Physical Therapy

## 2022-07-19 ENCOUNTER — Ambulatory Visit: Payer: 59 | Attending: Obstetrics and Gynecology | Admitting: Physical Therapy

## 2022-07-19 ENCOUNTER — Other Ambulatory Visit: Payer: Self-pay

## 2022-07-19 DIAGNOSIS — R1031 Right lower quadrant pain: Secondary | ICD-10-CM | POA: Diagnosis not present

## 2022-07-19 DIAGNOSIS — R102 Pelvic and perineal pain unspecified side: Secondary | ICD-10-CM

## 2022-07-19 DIAGNOSIS — M6281 Muscle weakness (generalized): Secondary | ICD-10-CM | POA: Diagnosis not present

## 2022-07-19 DIAGNOSIS — M62838 Other muscle spasm: Secondary | ICD-10-CM | POA: Diagnosis not present

## 2022-07-31 ENCOUNTER — Ambulatory Visit: Payer: 59 | Admitting: Physical Therapy

## 2022-07-31 ENCOUNTER — Encounter: Payer: Self-pay | Admitting: Physical Therapy

## 2022-07-31 ENCOUNTER — Ambulatory Visit: Payer: 59 | Attending: Obstetrics and Gynecology | Admitting: Physical Therapy

## 2022-07-31 DIAGNOSIS — R102 Pelvic and perineal pain: Secondary | ICD-10-CM | POA: Diagnosis not present

## 2022-07-31 DIAGNOSIS — M62838 Other muscle spasm: Secondary | ICD-10-CM

## 2022-07-31 DIAGNOSIS — R1031 Right lower quadrant pain: Secondary | ICD-10-CM | POA: Insufficient documentation

## 2022-07-31 DIAGNOSIS — M6281 Muscle weakness (generalized): Secondary | ICD-10-CM | POA: Diagnosis not present

## 2022-07-31 NOTE — Therapy (Signed)
OUTPATIENT PHYSICAL THERAPY TREATMENT NOTE   Patient Name: Susan Montoya MRN: [id number] DOB:[birth year], 47 y.o., female Today's Date: 07/31/2022  PCP: Shelda Pal, DO  REFERRING PROVIDER: Servando Salina, MD   END OF SESSION:   PT End of Session - 07/31/22 1449     Visit Number 2    Date for PT Re-Evaluation 10/11/22    Authorization Type cone    PT Start Time 1445    PT Stop Time [id number]    PT Time Calculation (min) 45 min    Activity Tolerance Patient tolerated treatment well    Behavior During Therapy [facility] for tasks assessed/performed             Past Medical History:  Diagnosis Date   Abnormal uterine bleeding (AUB)    Beta thalassemia minor    02-17-2019  per pt dx as child at age 34, was tested due to family history   Crohn's disease GI--- dr Collene Mares   no current med.----   s/p  colon resection 07/ 2007   Endometrial mass    GAD (generalized anxiety disorder)    HSV (herpes simplex virus) anogenital infection    HSV 2  (02-17-2019  per pt currently no flare)   TMJ syndrome    right side  (02-17-2019  per pt has had not issues in awhile)   Wears glasses    Past Surgical History:  Procedure Laterality Date   COLON RESECTION  07/ 2007--- age 79   terminal ileum and right colon w/ appendix   COLONOSCOPY WITH PROPOFOL  last one 05-09-2010   DILATATION & CURETTAGE/HYSTEROSCOPY WITH MYOSURE N/A 02/18/2019   Procedure: DILATATION & CURETTAGE/HYSTEROSCOPY WITH MYOSURE;  Surgeon: Servando Salina, MD;  Location: [place];  Service: Gynecology;  Laterality: N/A;   LAPAROSCOPIC TUBAL LIGATION Bilateral 11/07/2014   Procedure: LAPAROSCOPIC  BILATERAL TUBAL LIGATION with cautery and fulgeration of endmetrios;  Surgeon: Servando Salina, MD;  Location: [facility];  Service: Gynecology;  Laterality: Bilateral;   TONSILLECTOMY N/A 10/08/2013   Procedure: TONSILLECTOMY;  Surgeon: Rozetta Nunnery, MD;  Location: [place];   Service: ENT;  Laterality: N/A;   Parkman EXTRACTION     Patient Active Problem List   Diagnosis Date Noted   Migraine 11/02/2021   Lateral epicondylitis, right elbow 01/24/2021   Nonallopathic lesion of sacral region 02/09/2020   Hip flexor tendon tightness, unspecified laterality 12/17/2019   Seasonal allergic rhinitis due to pollen 07/08/2018   Seasonal allergic conjunctivitis 07/08/2018   Crohn's disease of both small and large intestine without complication [id number]   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   Herpes simplex type 2 infection 06/20/2015   GAD (generalized anxiety disorder) 06/20/2015   Beta-thalassemia ([place]) 08/18/2014   Postpartum care following vaginal delivery (4/19) 08/16/2014   Decreased fetal movement during pregnancy in third trimester, antepartum 08/07/2014   Irregular uterine contractions 08/07/2014   Crohn's disease ([place]) 09/22/2007   REFERRING DIAG: Dyspareunia in female N94.10   THERAPY DIAG:  Muscle weakness (generalized)   Pelvic pain   Right lower quadrant pain   Other muscle spasm   Rationale for Evaluation and Treatment: Rehabilitation   ONSET DATE: 2020   SUBJECTIVE:  SUBJECTIVE STATEMENT: Patient is feeling good from the initial evaluation.    PAIN:  Are you having pain? Yes NPRS scale: 5/10 average  Pain location:  internal on the right side   Pain type: spasm Pain description: intermittent    Aggravating factors: deep penetration, lift something too heavy, defecation, pain with partner on top Relieving factors: stop the activity, heat sometimes, no pain with partner from behind.    PRECAUTIONS: None   WEIGHT BEARING RESTRICTIONS: No   FALLS:  Has patient fallen in last 6 months? Yes. Number of falls fell down the stairs due to stairs being  slippery   LIVING ENVIRONMENT: Lives with: lives with their family   OCCUPATION: labor and delivery nurse   PLOF: Independent   PATIENT GOALS: reduce pain   PERTINENT HISTORY:  Colon Resection by the ileocecal valve removing large and small intestines and appendix; TMJ; Chron's disease; Endometrial mass     BOWEL MOVEMENT: Pain with bowel movement: Yes Type of bowel movement:Type (Bristol Stool Scale) loose, Frequency daily , Strain No, and Splinting no Fully empty rectum: Yes:   Leakage: No Pads: No Fiber supplement: Yes: probiotic   URINATION: Pain with urination: No Fully empty bladder: Yes:   Stream: Strong Urgency: No Frequency: average Leakage: Coughing and Sneezing     INTERCOURSE: Pain with intercourse: During Penetration with deep penetration Ability to have vaginal penetration:  Yes:   Climax: not with partner, able to climax with external stimulation like a vibrator Marinoff Scale: 1/3   PREGNANCY: Vaginal deliveries 3 Tearing Yes: and had stitches   PROLAPSE: None     OBJECTIVE:    DIAGNOSTIC FINDINGS:  CT was negative     COGNITION: Overall cognitive status: Within functional limits for tasks assessed                          SENSATION: Light touch: Appears intact Proprioception: Appears intact     POSTURE: No Significant postural limitations   PELVIC ALIGNMENT: Pelvis in correct alignment   LUMBARAROM/PROM:   A/PROM A/PROM  eval  Left lateral flexion Decreased by 25%  Left rotation Decreased by 25%   (Blank rows = not tested)   LOWER EXTREMITY [id number] hip ROM is full     LOWER EXTREMITY MMT:   MMT Right eval Left eval  Hip extension 4/5 4/5  Hip abduction 4/5 4-/5    PALPATION:   General  decreased tissue mobility of the right abdominal, bulges the lower abdomen when contracting the abdominals,                  External Perineal Exam tightness in the perineal body with decreased mobility                              Internal Pelvic Floor restrictions on the right side of the bladder, along the ovary, restrictions of the right side of the cervix, decreased contraction of the right urethra sphincter, tightness of the right introitus and posterior fourchette, decreased mobility of the right iliococcygeus and obturator internist   Patient confirms identification and approves PT to assess internal pelvic floor and treatment Yes   PELVIC MMT:   MMT eval  Vaginal 3/5 with weak hug  (Blank rows = not tested)         TONE: Increased on the right       TODAY'S TREATMENT:  07/31/22 Manual: Soft tissue mobilization: To assess for dry needling Manual work to the diaphragm with working with her breath, manual work to the right iliopsoas and rectus abdominus Scar tissue mobilization: Myofascial release: Using the suction cup to the abdomen pulling the skin off the muscles and along the scar on the right lower quadrant Tissue rolling of the abdomen Trigger Point Dry-Needling  Treatment instructions: Expect mild to moderate muscle soreness. S/S of pneumothorax if dry needled over a lung field, and to seek immediate medical attention should they occur. Patient verbalized understanding of these instructions and education.  Patient Consent Given: Yes Education handout provided: Yes Muscles treated: right psoas, bilateral rectus abdominus Electrical stimulation performed: No Parameters: N/A Treatment response/outcome: elongation of muscles and trigger point response.  Neuromuscular re-education: Down training: Diaphragmatic breathing with lower rib cage expansion then into the abdomen in supine and sitting    PATIENT EDUCATION: 07/31/22 Education details: Access Code: [email or web address] Person educated: Patient Education method: Explanation, Demonstration, Tactile cues, Verbal cues, and Handouts Education comprehension: verbalized understanding, returned demonstration, verbal cues required, tactile cues  required, and needs further education      HOME EXERCISE PROGRAM: 07/31/22 Access Code: [email or web address] URL: [email or web address] Date: 07/31/2022 Prepared by: Earlie Counts  Exercises - Supine Diaphragmatic Breathing  - 2 x daily - 7 x weekly - 1 sets - 10 reps - Seated Diaphragmatic Breathing  - 2 x daily - 7 x weekly - 1 sets - 10 reps  Patient Education - Trigger Point Dry Needling See above   ASSESSMENT:   CLINICAL IMPRESSION: Patient is a 47 y.o. female who was seen today for physical therapy  treatment for dyspareunia. Patient was able to open up the lower rib cage and down into the abdomen after the manual work. She has less pain in the abdomen. You could see less fascial restrictions in the abdominal tissue. Patient will benefit from skilled therapy to improve tissue mobility to reduce her pain and improve quality of life.    OBJECTIVE IMPAIRMENTS: decreased activity tolerance, decreased coordination, decreased strength, increased fascial restrictions, increased muscle spasms, and pain.    ACTIVITY LIMITATIONS: lifting and continence   PARTICIPATION LIMITATIONS: cleaning, interpersonal relationship, shopping, community activity, and occupation   PERSONAL FACTORS: Fitness, Past/current experiences, Time since onset of injury/illness/exacerbation, and 3+ comorbidities: Colon Resection by the ileocecal valve removing large and small intestines and appendix; TMJ; Chron's disease; Endometrial mass  are also affecting patient's functional outcome.    REHAB POTENTIAL: Excellent   CLINICAL DECISION MAKING: Evolving/moderate complexity   EVALUATION COMPLEXITY: Moderate     GOALS: Goals reviewed with patient? Yes   SHORT TERM GOALS: Target date: 08/15/22   Patient independent with perineal massage.  Baseline: Goal status: INITIAL   2.  Patient independent with manual work on the right lower quadrant to improve tissue mobility.  Baseline:  Goal status: INITIAL    3.  Patient independent with HEP for stretches and diaphragmatic breathing.  Baseline:  Goal status: INITIAL   4.  Patient able to contract the lower abdominal without bulging.  Baseline:  Goal status: INITIAL       LONG TERM GOALS: Target date: 10/11/22   Patient independent with advanced HEP.  Baseline:  Goal status: INITIAL   2.  Patient able to have penile penetration vaginally with no pain due to improved mobility of the tissue.  Baseline:  Goal status: INITIAL   3.  Pelvic floor strength >/= 4/5 holding for 10 seconds due to improved  perineal body mobility and introitus.  Baseline:  Goal status: INITIAL   4.  Patient able to lift items with correct body mechanics with no pain due to improve lower abdominal contraction.  Baseline:  Goal status: INITIAL   5.  Patient is able to have a bowel movement with correct toileting technique and no pain due to improve tissue mobility.  Baseline:  Goal status: INITIAL     PLAN:   PT FREQUENCY: 1x/week   PT DURATION: 12 weeks   PLANNED INTERVENTIONS: Therapeutic exercises, Therapeutic activity, Neuromuscular re-education, Patient/Family education, Joint mobilization, Dry Needling, Electrical stimulation, Spinal mobilization, Cryotherapy, Moist heat, scar mobilization, Ultrasound, Biofeedback, and Manual therapy   PLAN FOR NEXT SESSION: manual work to the pelvic floor and right side of the bladder, work on upper abdominal tissue and diaphragm, work on abdominal contraction for lower, educated her on how to perform manual work to the abdomen with tissue rolling, dry needling to the right psoas.    Earlie Counts, PT 07/31/22 3:45 PM

## 2022-08-04 IMAGING — DX DG LUMBAR SPINE COMPLETE 4+V
5 series · 5 of 5 positions shown · non-contrast
Comparison: None.

CLINICAL DATA: Chronic back pain, worsening.

EXAM:
LUMBAR SPINE - COMPLETE 4+ VIEW

[c-spine obl (1 of 2)]
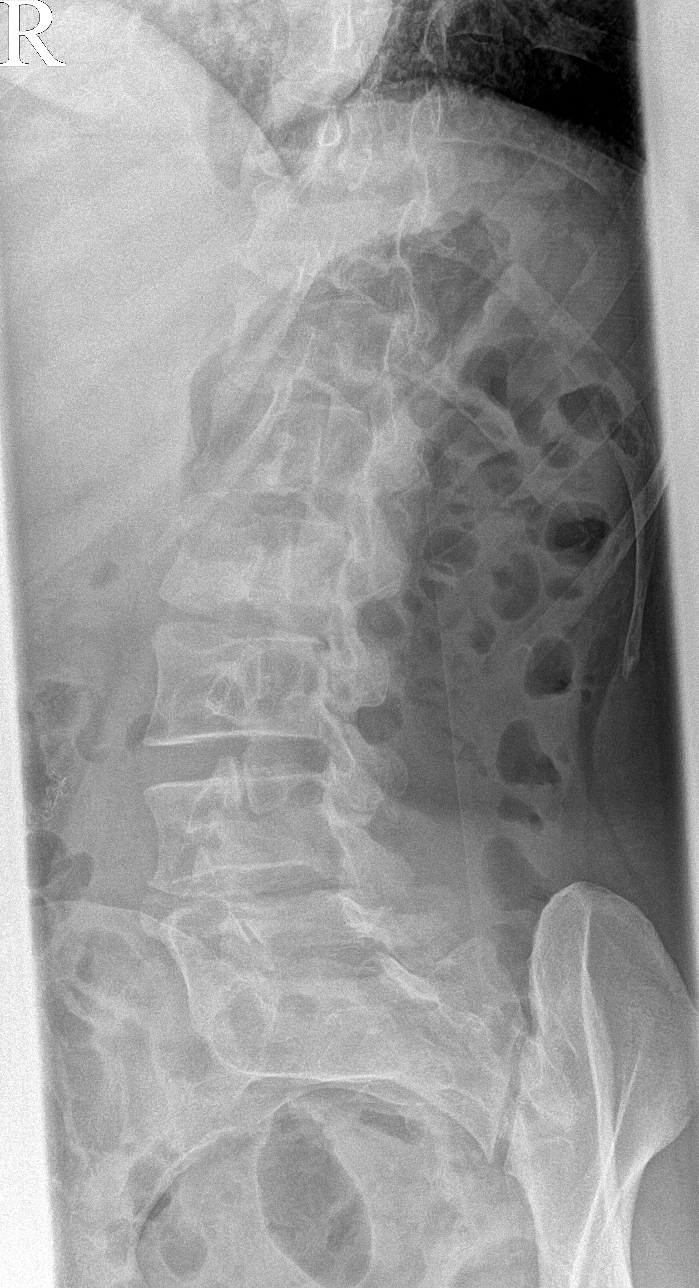

[c-spine obl (2 of 2)]
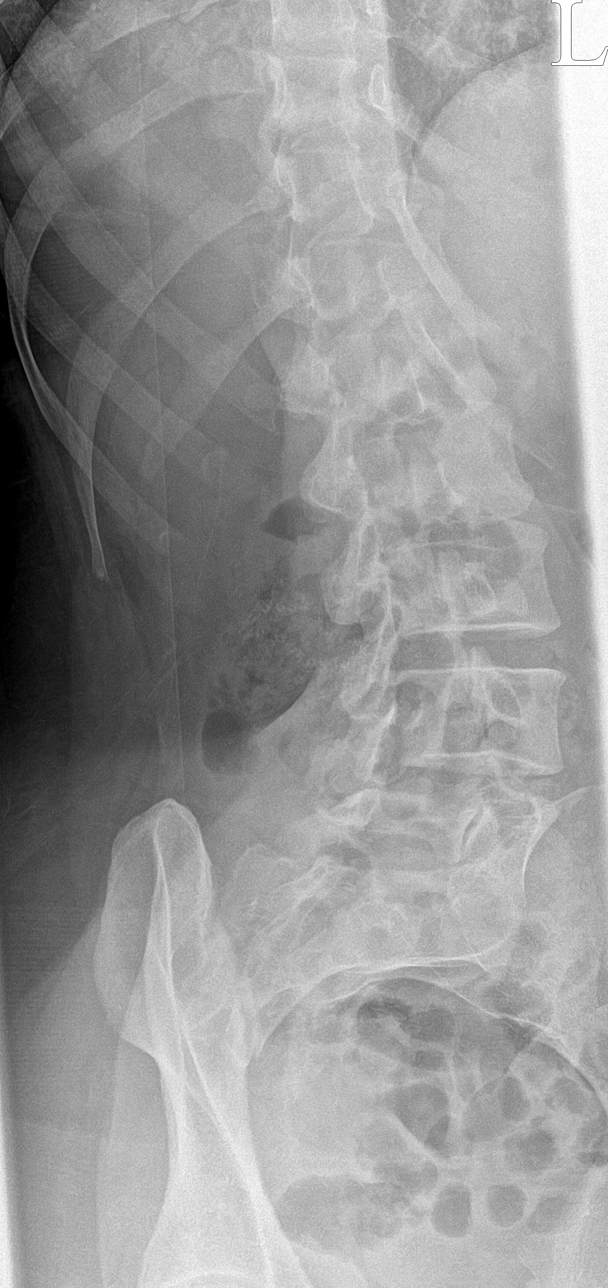

[l-spine lateral]
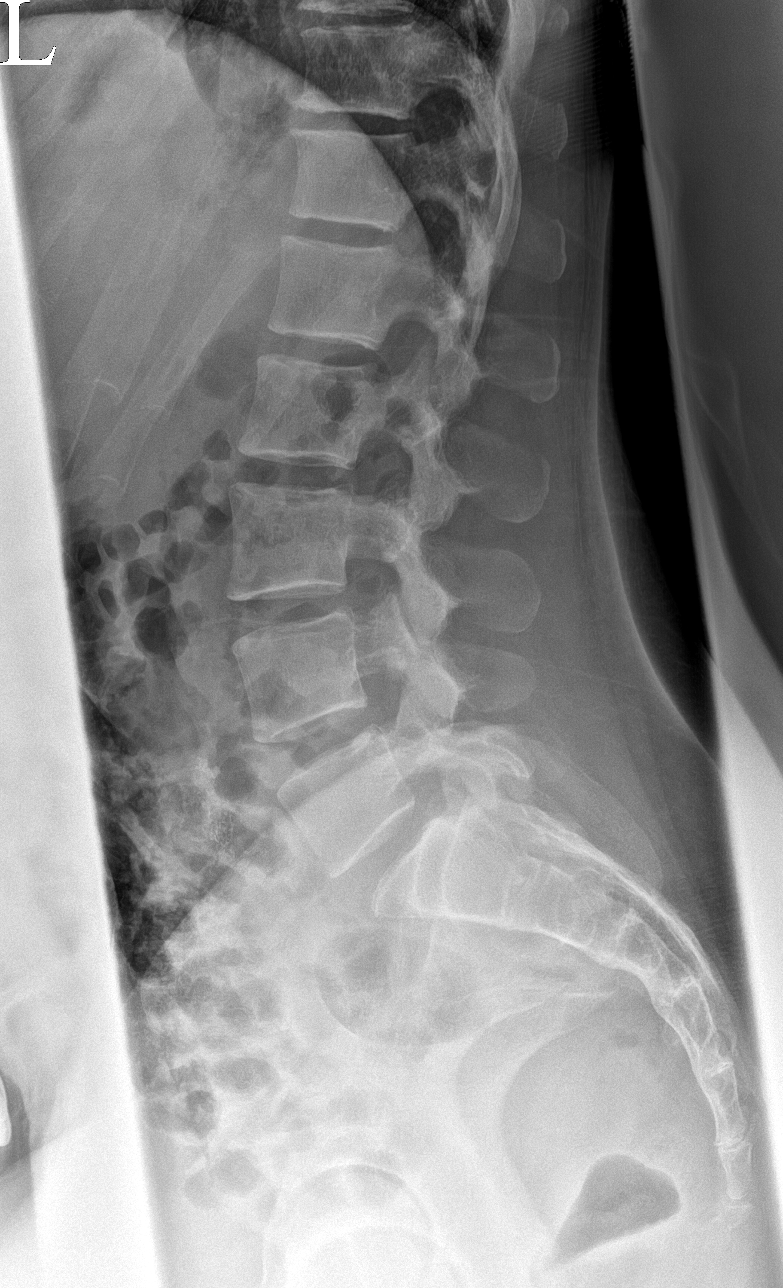

[l-spine spot]
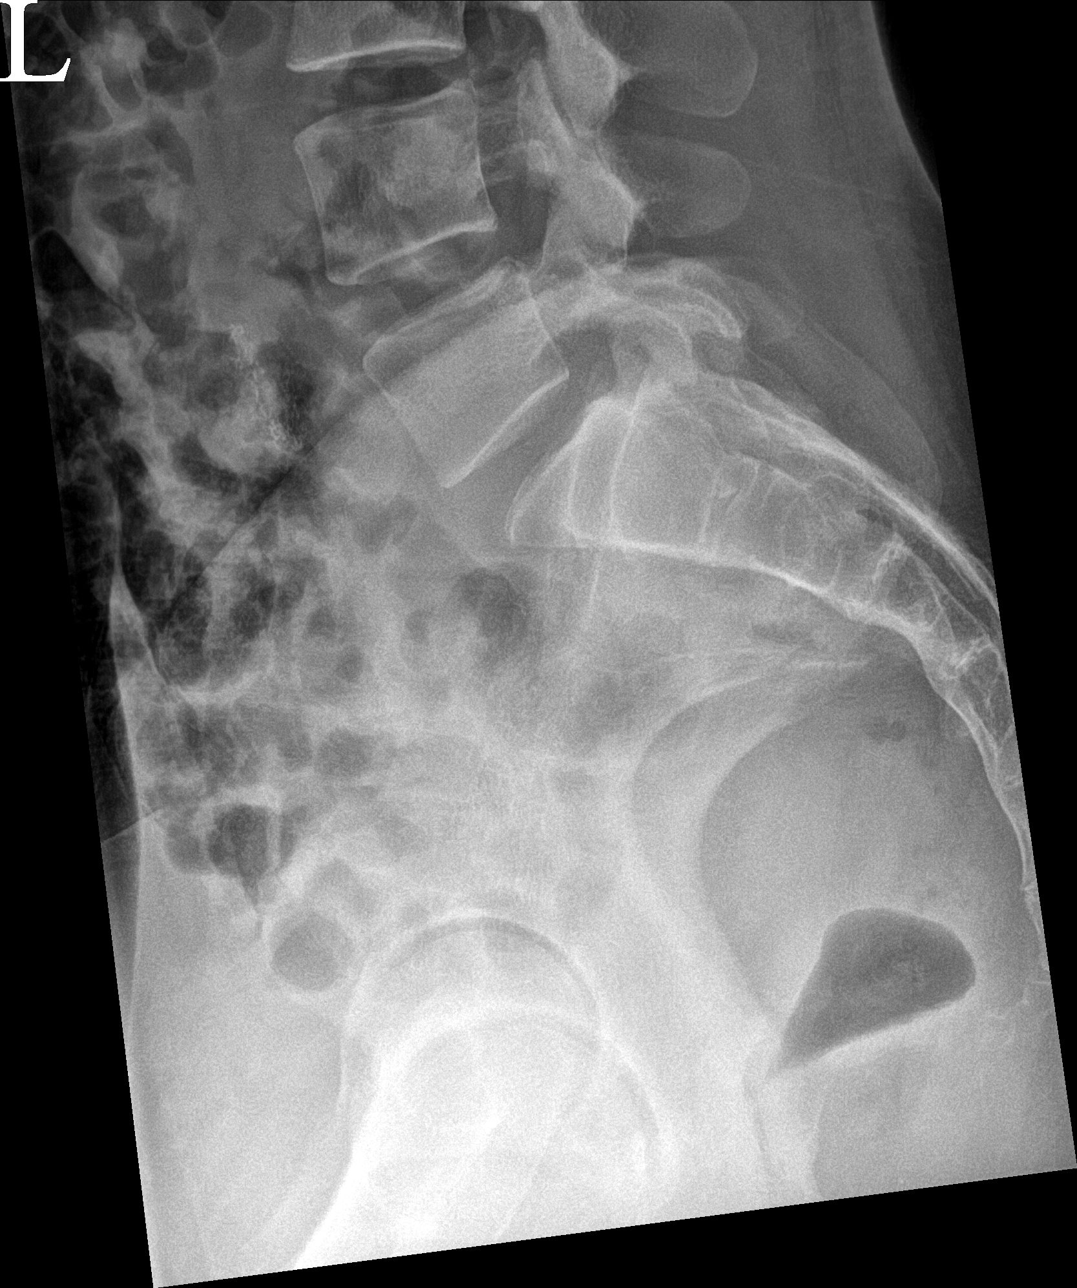

[c-spine lat]
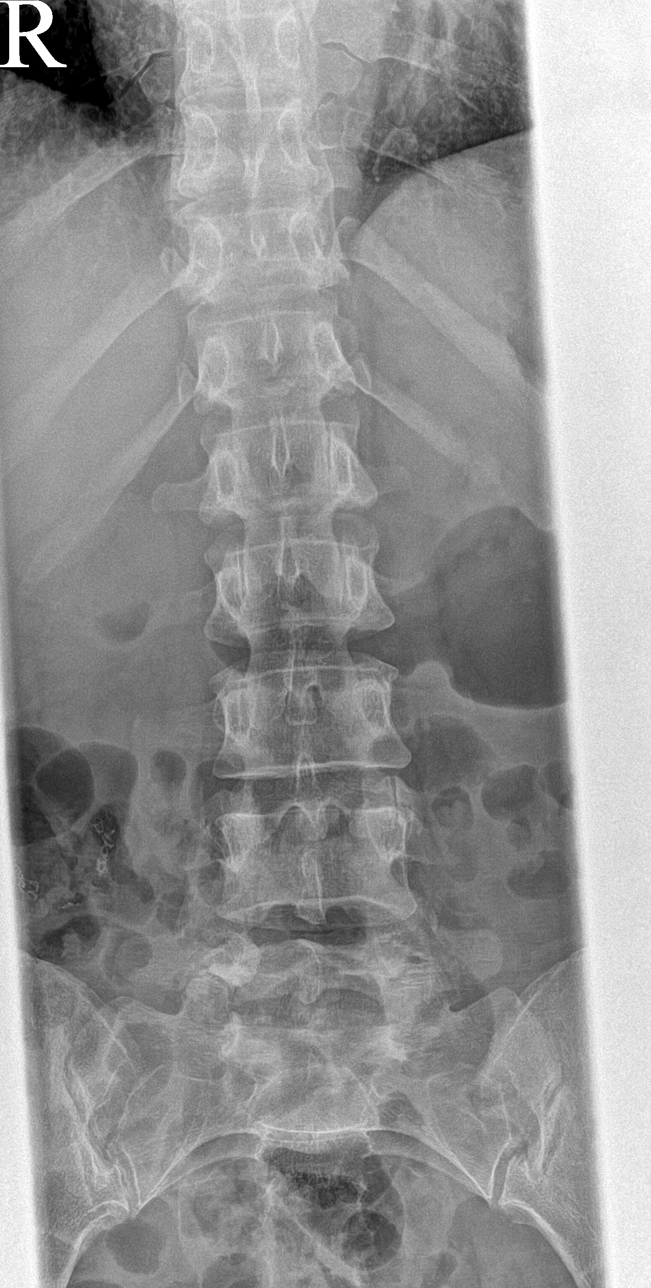

[5 of 5 positions shown; findings below may reference images not displayed]

FINDINGS: There is no evidence of lumbar spine fracture. Vertebral heights are
maintained. Alignment is normal. Intervertebral disc spaces are
maintained. Mild lower lumbar facet arthropathy.
IMPRESSION: 1. No acute fracture or malalignment.
2. Mild lower lumbar degenerative change.

## 2022-08-05 ENCOUNTER — Other Ambulatory Visit (HOSPITAL_BASED_OUTPATIENT_CLINIC_OR_DEPARTMENT_OTHER): Payer: Self-pay

## 2022-08-05 ENCOUNTER — Ambulatory Visit: Payer: 59 | Admitting: Physical Therapy

## 2022-08-05 MED ORDER — WEGOVY 2.4 MG/0.75ML ~~LOC~~ SOAJ
2.4000 mg | SUBCUTANEOUS | 3 refills | Status: DC
  Filled 2022-08-05 – 2022-09-15 (×3): qty 3, 28d supply, fill #0

## 2022-08-06 ENCOUNTER — Other Ambulatory Visit (HOSPITAL_BASED_OUTPATIENT_CLINIC_OR_DEPARTMENT_OTHER): Payer: Self-pay

## 2022-08-07 ENCOUNTER — Encounter: Payer: Self-pay | Admitting: Family Medicine

## 2022-08-08 ENCOUNTER — Other Ambulatory Visit: Payer: Self-pay

## 2022-08-08 ENCOUNTER — Other Ambulatory Visit (HOSPITAL_BASED_OUTPATIENT_CLINIC_OR_DEPARTMENT_OTHER): Payer: Self-pay

## 2022-08-09 ENCOUNTER — Other Ambulatory Visit: Payer: Self-pay | Admitting: Family Medicine

## 2022-08-09 DIAGNOSIS — R0681 Apnea, not elsewhere classified: Secondary | ICD-10-CM

## 2022-08-09 NOTE — Addendum Note (Signed)
Addended by: Scharlene Gloss B on: 08/09/2022 07:14 AM   Modules accepted: Orders

## 2022-08-12 ENCOUNTER — Encounter: Payer: Self-pay | Admitting: Physical Therapy

## 2022-08-12 ENCOUNTER — Ambulatory Visit: Payer: 59 | Admitting: Physical Therapy

## 2022-08-12 DIAGNOSIS — M6281 Muscle weakness (generalized): Secondary | ICD-10-CM

## 2022-08-12 DIAGNOSIS — R1031 Right lower quadrant pain: Secondary | ICD-10-CM

## 2022-08-12 DIAGNOSIS — R102 Pelvic and perineal pain: Secondary | ICD-10-CM | POA: Diagnosis not present

## 2022-08-12 DIAGNOSIS — M62838 Other muscle spasm: Secondary | ICD-10-CM | POA: Diagnosis not present

## 2022-08-12 NOTE — Therapy (Signed)
OUTPATIENT PHYSICAL THERAPY TREATMENT NOTE   Patient Name: Susan Montoya MRN: [id number] DOB:03-14-1976, 47 y.o., female Today's Date: 08/12/2022  PCP:  Sharlene Dory, DO  REFERRING PROVIDER: Maxie Better, MD   END OF SESSION:   PT End of Session - 08/12/22 1232     Visit Number 3    Date for PT Re-Evaluation 10/11/22    Authorization Type cone    PT Start Time 1230    PT Stop Time 1310    PT Time Calculation (min) 40 min    Activity Tolerance Patient tolerated treatment well    Behavior During Therapy [facility] for tasks assessed/performed             Past Medical History:  Diagnosis Date   Abnormal uterine bleeding (AUB)    Beta thalassemia minor    02-17-2019  per pt dx as child at age 61, was tested due to family history   Crohn's disease GI--- dr Loreta [address]   no current med.----   s/p  colon resection 07/ 2007   Endometrial mass    GAD (generalized anxiety disorder)    HSV (herpes simplex virus) anogenital infection    HSV 2  (02-17-2019  per pt currently no flare)   TMJ syndrome    right side  (02-17-2019  per pt has had not issues in awhile)   Wears glasses    Past Surgical History:  Procedure Laterality Date   COLON RESECTION  07/ 2007--- age 47   terminal ileum and right colon w/ appendix   COLONOSCOPY WITH PROPOFOL  last one 05-09-2010   DILATATION & CURETTAGE/HYSTEROSCOPY WITH MYOSURE N/A 02/18/2019   Procedure: DILATATION & CURETTAGE/HYSTEROSCOPY WITH MYOSURE;  Surgeon: Maxie Better, MD;  Location: [facility];  Service: Gynecology;  Laterality: N/A;   LAPAROSCOPIC TUBAL LIGATION Bilateral 11/07/2014   Procedure: LAPAROSCOPIC  BILATERAL TUBAL LIGATION with cautery and fulgeration of endmetrios;  Surgeon: Maxie Better, MD;  Location: [facility];  Service: Gynecology;  Laterality: Bilateral;   TONSILLECTOMY N/A 10/08/2013   Procedure: TONSILLECTOMY;  Surgeon: Drema Halon, MD;  Location:  S[facility]ERY [facility];   Service: ENT;  Laterality: N/A;   WISDOM TOOTH EXTRACTION     Patient Active Problem List   Diagnosis Date Noted   Migraine 11/02/2021   Lateral epicondylitis, right elbow 01/24/2021   Nonallopathic lesion of sacral region 02/09/2020   Hip flexor tendon tightness, unspecified laterality 12/17/2019   Seasonal allergic rhinitis due to pollen 07/08/2018   Seasonal allergic conjunctivitis 07/08/2018   Crohn's disease of both small and large intestine without complication 07/08/2018   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   Herpes simplex type 2 infection 06/20/2015   GAD (generalized anxiety disorder) 06/20/2015   Beta-thalassemia (HCC) 08/18/2014   Postpartum care following vaginal delivery (4/19) 08/16/2014   Decreased fetal movement during pregnancy in third trimester, antepartum 08/07/2014   Irregular uterine contractions 08/07/2014   Crohn's disease (HCC) 09/22/2007   REFERRING DIAG: Dyspareunia in female N94.10   THERAPY DIAG:  Muscle weakness (generalized)   Pelvic pain   Right lower quadrant pain   Other muscle spasm   Rationale for Evaluation and Treatment: Rehabilitation   ONSET DATE: 2020   SUBJECTIVE:  SUBJECTIVE STATEMENT: Patient is feeling good from the initial evaluation.    PAIN:  Are you having pain? Yes NPRS scale: 5/10 average  Pain location:  internal on the right side   Pain type: spasm Pain description: intermittent    Aggravating factors: deep penetration, lift something too heavy, defecation, pain with partner on top Relieving factors: stop the activity, heat sometimes, no pain with partner from behind.    PRECAUTIONS: None   WEIGHT BEARING RESTRICTIONS: No   FALLS:  Has patient fallen in last 6 months? Yes. Number of falls fell down the stairs due to stairs being  slippery   LIVING ENVIRONMENT: Lives with: lives with their family   OCCUPATION: labor and delivery nurse   PLOF: Independent   PATIENT GOALS: reduce pain   PERTINENT HISTORY:  Colon Resection by the ileocecal valve removing large and small intestines and appendix; TMJ; Chron's disease; Endometrial mass     BOWEL MOVEMENT: Pain with bowel movement: Yes Type of bowel movement:Type (Bristol Stool Scale) loose, Frequency daily , Strain No, and Splinting no Fully empty rectum: Yes:   Leakage: No Pads: No Fiber supplement: Yes: probiotic   URINATION: Pain with urination: No Fully empty bladder: Yes:   Stream: Strong Urgency: No Frequency: average Leakage: Coughing and Sneezing     INTERCOURSE: Pain with intercourse: During Penetration with deep penetration Ability to have vaginal penetration:  Yes:   Climax: not with partner, able to climax with external stimulation like a vibrator Marinoff Scale: 1/3   PREGNANCY: Vaginal deliveries 3 Tearing Yes: and had stitches   PROLAPSE: None     OBJECTIVE:    DIAGNOSTIC FINDINGS:  CT was negative     COGNITION: Overall cognitive status: Within functional limits for tasks assessed                          SENSATION: Light touch: Appears intact Proprioception: Appears intact     POSTURE: No Significant postural limitations   PELVIC ALIGNMENT: Pelvis in correct alignment   LUMBARAROM/PROM:   A/PROM A/PROM  eval  Left lateral flexion Decreased by 25%  Left rotation Decreased by 25%   (Blank rows = not tested)   LOWER EXTREMITY [id number] hip ROM is full     LOWER EXTREMITY MMT:   MMT Right eval Left eval  Hip extension 4/5 4/5  Hip abduction 4/5 4-/5    PALPATION:   General  decreased tissue mobility of the right abdominal, bulges the lower abdomen when contracting the abdominals,                  External Perineal Exam tightness in the perineal body with decreased mobility                              Internal Pelvic Floor restrictions on the right side of the bladder, along the ovary, restrictions of the right side of the cervix, decreased contraction of the right urethra sphincter, tightness of the right introitus and posterior fourchette, decreased mobility of the right iliococcygeus and obturator internist   Patient confirms identification and approves PT to assess internal pelvic floor and treatment Yes   PELVIC MMT:   MMT eval 08/12/22  Vaginal 3/5 with weak hug 3/5  (Blank rows = not tested)         TONE: Increased on the right       TODAY'S TREATMENT:  08/12/22 Manual: Soft tissue mobilization: Manually work to the diaphragm Internal pelvic floor techniques: No emotional/communication barriers or cognitive limitation. Patient is motivated to learn. Patient understands and agrees with treatment goals and plan. PT explains patient will be examined in standing, sitting, and lying down to see how their muscles and joints work. When they are ready, they will be asked to remove their underwear so PT can examine their perineum. The patient is also given the option of providing their own chaperone as one is not provided in our facility. The patient also has the right and is explained the right to defer or refuse any part of the evaluation or treatment including the internal exam. With the patient's consent, PT will use one gloved finger to gently assess the muscles of the pelvic floor, seeing how well it contracts and relaxes and if there is muscle symmetry. After, the patient will get dressed and PT and patient will discuss exam findings and plan of care. PT and patient discuss plan of care, schedule, attendance policy and HEP activities.  Going through the vagina and working on the introitus, along the anterior right cervix, just above the bladder Neuromuscular re-education: Core retraining: Hookly with abdominal contraction, patient hand on the abdomen to feel the lower  abdominals contract equally with the upper and ball squeeze Sidely abdominal contraction with pressing on ball to engage the lower abdomen 5 times each side Quadruped contract the transverse abdominus with ball squeeze  Pelvic floor contraction training: Tapping of the lateral pelvic floor muscles to contract Down training: Diaphragmatic  breathing with the therapist guiding the rib cage to open and close  07/31/22 Manual: Soft tissue mobilization: To assess for dry needling Manual work to the diaphragm with working with her breath, manual work to the right iliopsoas and rectus abdominus Scar tissue mobilization: Myofascial release: Using the suction cup to the abdomen pulling the skin off the muscles and along the scar on the right lower quadrant Tissue rolling of the abdomen Trigger Point Dry-Needling  Treatment instructions: Expect mild to moderate muscle soreness. S/S of pneumothorax if dry needled over a lung field, and to seek immediate medical attention should they occur. Patient verbalized understanding of these instructions and education.   Patient Consent Given: Yes Education handout provided: Yes Muscles treated: right psoas, bilateral rectus abdominus Electrical stimulation performed: No Parameters: N/A Treatment response/outcome: elongation of muscles and trigger point response.  Neuromuscular re-education: Down training: Diaphragmatic breathing with lower rib cage expansion then into the abdomen in supine and sitting     PATIENT EDUCATION: 08/12/22 Education details: Access Code: [email or web address] Person educated: Patient Education method: Explanation, Demonstration, Tactile cues, Verbal cues, and Handouts Education comprehension: verbalized understanding, returned demonstration, verbal cues required, tactile cues required, and needs further education       HOME EXERCISE PROGRAM: 08/12/22 Access Code: [email or web address] URL: [email or web address] Date:  08/12/2022 Prepared by: Eulis Foster  Exercises - Supine Diaphragmatic Breathing  - 2 x daily - 7 x weekly - 1 sets - 10 reps - Seated Diaphragmatic Breathing  - 2 x daily - 7 x weekly - 1 sets - 10 reps - Supine Transversus Abdominis Bracing - Hands on Stomach  - 1 x daily - 7 x weekly - 1 sets - 10 reps - Sidelying Transversus Abdominis Bracing  - 1 x daily - 7 x weekly - 1 sets - 10 reps - Quadruped Transversus Abdominis Bracing  - 1 x daily - 7 x weekly - 1 sets - 10  reps - Seated Pelvic Floor Contraction  - 3 x daily - 7 x weekly - 1 sets - 10 reps - 5 sec hold    ASSESSMENT:   CLINICAL IMPRESSION: Patient is a 47 y.o. female who was seen today for physical therapy  treatment for dyspareunia. Pelvic floor strength is 3/5. Patient is able to have intercourse with less pain. She is learning how to engage the lower abdominal equally with the upper abdominals. Patient does not have trigger points in the abdominals. Patient will benefit from skilled therapy to improve tissue mobility to reduce her pain and improve quality of life.    OBJECTIVE IMPAIRMENTS: decreased activity tolerance, decreased coordination, decreased strength, increased fascial restrictions, increased muscle spasms, and pain.    ACTIVITY LIMITATIONS: lifting and continence   PARTICIPATION LIMITATIONS: cleaning, interpersonal relationship, shopping, community activity, and occupation   PERSONAL FACTORS: Fitness, Past/current experiences, Time since onset of injury/illness/exacerbation, and 3+ comorbidities: Colon Resection by the ileocecal valve removing large and small intestines and appendix; TMJ; Chron's disease; Endometrial mass  are also affecting patient's functional outcome.    REHAB POTENTIAL: Excellent   CLINICAL DECISION MAKING: Evolving/moderate complexity   EVALUATION COMPLEXITY: Moderate     GOALS: Goals reviewed with patient? Yes   SHORT TERM GOALS: Target date: 08/15/22   Patient independent with  perineal massage.  Baseline: Goal status: INITIAL   2.  Patient independent with manual work on the right lower quadrant to improve tissue mobility.  Baseline:  Goal status: Met 08/12/22   3.  Patient independent with HEP for stretches and diaphragmatic breathing.  Baseline:  Goal status: INITIAL   4.  Patient able to contract the lower abdominal without bulging.  Baseline:  Goal status: ongoing 08/12/22       LONG TERM GOALS: Target date: 10/11/22   Patient independent with advanced HEP.  Baseline:  Goal status: INITIAL   2.  Patient able to have penile penetration vaginally with no pain due to improved mobility of the tissue.  Baseline:  Goal status: INITIAL   3.  Pelvic floor strength >/= 4/5 holding for 10 seconds due to improved perineal body mobility and introitus.  Baseline:  Goal status: INITIAL   4.  Patient able to lift items with correct body mechanics with no pain due to improve lower abdominal contraction.  Baseline:  Goal status: INITIAL   5.  Patient is able to have a bowel movement with correct toileting technique and no pain due to improve tissue mobility.  Baseline:  Goal status: INITIAL     PLAN:   PT FREQUENCY: 1x/week   PT DURATION: 12 weeks   PLANNED INTERVENTIONS: Therapeutic exercises, Therapeutic activity, Neuromuscular re-education, Patient/Family education, Joint mobilization, Dry Needling, Electrical stimulation, Spinal mobilization, Cryotherapy, Moist heat, scar mobilization, Ultrasound, Biofeedback, and Manual therapy   PLAN FOR NEXT SESSION: manual work to the pelvic floor and right side of the bladder, work on upper abdominal tissue and diaphragm, work on abdominal contraction for lower, educated her on how to perform manual work to the abdomen with tissue rolling, dry needling to the right psoas.   Eulis Foster, PT 08/12/22 1:20 PM

## 2022-08-13 ENCOUNTER — Other Ambulatory Visit (HOSPITAL_BASED_OUTPATIENT_CLINIC_OR_DEPARTMENT_OTHER): Payer: Self-pay

## 2022-08-14 ENCOUNTER — Other Ambulatory Visit (HOSPITAL_BASED_OUTPATIENT_CLINIC_OR_DEPARTMENT_OTHER): Payer: Self-pay

## 2022-08-15 ENCOUNTER — Other Ambulatory Visit (HOSPITAL_BASED_OUTPATIENT_CLINIC_OR_DEPARTMENT_OTHER): Payer: Self-pay

## 2022-08-16 ENCOUNTER — Other Ambulatory Visit (HOSPITAL_BASED_OUTPATIENT_CLINIC_OR_DEPARTMENT_OTHER): Payer: Self-pay

## 2022-08-19 ENCOUNTER — Ambulatory Visit: Payer: 59 | Admitting: Physical Therapy

## 2022-08-19 ENCOUNTER — Encounter: Payer: Self-pay | Admitting: Physical Therapy

## 2022-08-19 DIAGNOSIS — M62838 Other muscle spasm: Secondary | ICD-10-CM

## 2022-08-19 DIAGNOSIS — R1031 Right lower quadrant pain: Secondary | ICD-10-CM

## 2022-08-19 DIAGNOSIS — R102 Pelvic and perineal pain: Secondary | ICD-10-CM

## 2022-08-19 DIAGNOSIS — M6281 Muscle weakness (generalized): Secondary | ICD-10-CM | POA: Diagnosis not present

## 2022-08-19 NOTE — Therapy (Signed)
OUTPATIENT PHYSICAL THERAPY TREATMENT NOTE   Patient Name: Susan Montoya MRN: [id number] DOB:1975/09/01, 47 y.o., female Today's Date: 08/19/2022  PCP: Sharlene Dory, DO  REFERRING PROVIDER: Maxie Better, MD   END OF SESSION:   PT End of Session - 08/19/22 1613     Visit Number 4    Date for PT Re-Evaluation 10/11/22    Authorization Type cone    PT Start Time 1615    PT Stop Time 1655    PT Time Calculation (min) 40 min    Activity Tolerance Patient tolerated treatment well    Behavior During Therapy [facility] for tasks assessed/performed             Past Medical History:  Diagnosis Date   Abnormal uterine bleeding (AUB)    Beta thalassemia minor    02-17-2019  per pt dx as child at age 63, was tested due to family history   Crohn's disease GI--- dr Loreta [address]   no current med.----   s/p  colon resection 07/ 2007   Endometrial mass    GAD (generalized anxiety disorder)    HSV (herpes simplex virus) anogenital infection    HSV 2  (02-17-2019  per pt currently no flare)   TMJ syndrome    right side  (02-17-2019  per pt has had not issues in awhile)   Wears glasses    Past Surgical History:  Procedure Laterality Date   COLON RESECTION  07/ 2007--- age 38   terminal ileum and right colon w/ appendix   COLONOSCOPY WITH PROPOFOL  last one 05-09-2010   DILATATION & CURETTAGE/HYSTEROSCOPY WITH MYOSURE N/A 02/18/2019   Procedure: DILATATION & CURETTAGE/HYSTEROSCOPY WITH MYOSURE;  Surgeon: Maxie Better, MD;  Location: [facility];  Service: Gynecology;  Laterality: N/A;   LAPAROSCOPIC TUBAL LIGATION Bilateral 11/07/2014   Procedure: LAPAROSCOPIC  BILATERAL TUBAL LIGATION with cautery and fulgeration of endmetrios;  Surgeon: Maxie Better, MD;  Location: [facility];  Service: Gynecology;  Laterality: Bilateral;   TONSILLECTOMY N/A 10/08/2013   Procedure: TONSILLECTOMY;  Surgeon: Drema Halon, MD;  Location: [facility];   Service: ENT;  Laterality: N/A;   WISDOM TOOTH EXTRACTION     Patient Active Problem List   Diagnosis Date Noted   Migraine 11/02/2021   Lateral epicondylitis, right elbow 01/24/2021   Nonallopathic lesion of sacral region 02/09/2020   Hip flexor tendon tightness, unspecified laterality 12/17/2019   Seasonal allergic rhinitis due to pollen 07/08/2018   Seasonal allergic conjunctivitis 07/08/2018   Crohn's disease of both small and large intestine without complication 07/08/2018   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   Herpes simplex type 2 infection 06/20/2015   GAD (generalized anxiety disorder) 06/20/2015   Beta-thalassemia (HCC) 08/18/2014   Postpartum care following vaginal delivery (4/19) 08/16/2014   Decreased fetal movement during pregnancy in third trimester, antepartum 08/07/2014   Irregular uterine contractions 08/07/2014   Crohn's disease (HCC) 09/22/2007   REFERRING DIAG: Dyspareunia in female N94.10   THERAPY DIAG:  Muscle weakness (generalized)   Pelvic pain   Right lower quadrant pain   Other muscle spasm   Rationale for Evaluation and Treatment: Rehabilitation   ONSET DATE: 2020   SUBJECTIVE:  SUBJECTIVE STATEMENT: I do not have the pain I used to have.  Patient is feeling good from the initial evaluation.    PAIN:  Are you having pain? Yes NPRS scale: 5/10 average  Pain location:  internal on the right side   Pain type: spasm Pain description: intermittent    Aggravating factors: deep penetration, pain with partner on top Relieving factors: stop the activity, heat sometimes, no pain with partner from behind.    PRECAUTIONS: None   WEIGHT BEARING RESTRICTIONS: No   FALLS:  Has patient fallen in last 6 months? Yes. Number of falls fell down the stairs due to stairs being  slippery   LIVING ENVIRONMENT: Lives with: lives with their family   OCCUPATION: labor and delivery nurse   PLOF: Independent   PATIENT GOALS: reduce pain   PERTINENT HISTORY:  Colon Resection by the ileocecal valve removing large and small intestines and appendix; TMJ; Chron's disease; Endometrial mass     BOWEL MOVEMENT: Pain with bowel movement: Yes Type of bowel movement:Type (Bristol Stool Scale) loose, Frequency daily , Strain No, and Splinting no Fully empty rectum: Yes:   Leakage: No Pads: No Fiber supplement: Yes: probiotic   URINATION: Pain with urination: No Fully empty bladder: Yes:   Stream: Strong Urgency: No Frequency: average Leakage: Coughing and Sneezing     INTERCOURSE: Pain with intercourse: During Penetration with deep penetration Ability to have vaginal penetration:  Yes:   Climax: not with partner, able to climax with external stimulation like a vibrator Marinoff Scale: 1/3   PREGNANCY: Vaginal deliveries 3 Tearing Yes: and had stitches   PROLAPSE: None     OBJECTIVE:    DIAGNOSTIC FINDINGS:  CT was negative     COGNITION: Overall cognitive status: Within functional limits for tasks assessed                          SENSATION: Light touch: Appears intact Proprioception: Appears intact     POSTURE: No Significant postural limitations   PELVIC ALIGNMENT: Pelvis in correct alignment   LUMBARAROM/PROM:   A/PROM A/PROM  eval  Left lateral flexion Decreased by 25%  Left rotation Decreased by 25%   (Blank rows = not tested)   LOWER EXTREMITY ZOX:[id number] hip ROM is full     LOWER EXTREMITY MMT:   MMT Right eval Left eval  Hip extension 4/5 4/5  Hip abduction 4/5 4-/5    PALPATION:   General  decreased tissue mobility of the right abdominal, bulges the lower abdomen when contracting the abdominals,                  External Perineal Exam tightness in the perineal body with decreased mobility                              Internal Pelvic Floor restrictions on the right side of the bladder, along the ovary, restrictions of the right side of the cervix, decreased contraction of the right urethra sphincter, tightness of the right introitus and posterior fourchette, decreased mobility of the right iliococcygeus and obturator internist   Patient confirms identification and approves PT to assess internal pelvic floor and treatment Yes   PELVIC MMT:   MMT eval 08/12/22  Vaginal 3/5 with weak hug 3/5  (Blank rows = not tested)         TONE: Increased on the right  TODAY'S TREATMENT:    08/19/22 Manual: Soft tissue mobilization: To assess for dry needling Manual work to the right psoas to elongate after dry needling Manual work to left quadratus  mobilization: Anterior glide of right femoral head in prone grade 3 with hip extension Right SI joint mobilization with sacrum stabilized Gapping of the right L3-L5 in sidely Trigger Point Dry-Needling  Treatment instructions: Expect mild to moderate muscle soreness. S/S of pneumothorax if dry needled over a lung field, and to seek immediate medical attention should they occur. Patient verbalized understanding of these instructions and education.  Patient Consent Given: Yes Education handout provided: Previously provided Muscles treated: right psoas muscles Electrical stimulation performed: No Parameters: N/A Treatment response/outcome: elongation of muscles and trigger point response  Exercises: Stretches/mobility: Prone on ball in the right psoas area to elongate the tissue and reduce trigger points.  Prone on ball and IR and ER right leg to massage the psoas Prone on ball and extend the right leg then roll onto the right hip Z stretch of hips and lift upward to stretch the anterior hip  Prone right hip flexor stretch grabbing the right foot Strengthening: One legged bridge 15x on each Quadruped with right knee on yoga block and lift left hip up  15x then rock forward and back     08/12/22 Manual: Soft tissue mobilization: Manually work to the diaphragm Internal pelvic floor techniques: No emotional/communication barriers or cognitive limitation. Patient is motivated to learn. Patient understands and agrees with treatment goals and plan. PT explains patient will be examined in standing, sitting, and lying down to see how their muscles and joints work. When they are ready, they will be asked to remove their underwear so PT can examine their perineum. The patient is also given the option of providing their own chaperone as one is not provided in our facility. The patient also has the right and is explained the right to defer or refuse any part of the evaluation or treatment including the internal exam. With the patient's consent, PT will use one gloved finger to gently assess the muscles of the pelvic floor, seeing how well it contracts and relaxes and if there is muscle symmetry. After, the patient will get dressed and PT and patient will discuss exam findings and plan of care. PT and patient discuss plan of care, schedule, attendance policy and HEP activities.  Going through the vagina and working on the introitus, along the anterior right cervix, just above the bladder Neuromuscular re-education: Core retraining: Hookly with abdominal contraction, patient hand on the abdomen to feel the lower abdominals contract equally with the upper and ball squeeze Sidely abdominal contraction with pressing on ball to engage the lower abdomen 5 times each side Quadruped contract the transverse abdominus with ball squeeze  Pelvic floor contraction training: Tapping of the lateral pelvic floor muscles to contract Down training: Diaphragmatic  breathing with the therapist guiding the rib cage to open and close   07/31/22 Manual: Soft tissue mobilization: To assess for dry needling Manual work to the diaphragm with working with her breath, manual work to  the right iliopsoas and rectus abdominus Scar tissue mobilization: Myofascial release: Using the suction cup to the abdomen pulling the skin off the muscles and along the scar on the right lower quadrant Tissue rolling of the abdomen Trigger Point Dry-Needling  Treatment instructions: Expect mild to moderate muscle soreness. S/S of pneumothorax if dry needled over a lung field, and to seek immediate medical attention  should they occur. Patient verbalized understanding of these instructions and education.   Patient Consent Given: Yes Education handout provided: Yes Muscles treated: right psoas, bilateral rectus abdominus Electrical stimulation performed: No Parameters: N/A Treatment response/outcome: elongation of muscles and trigger point response.  Neuromuscular re-education: Down training: Diaphragmatic breathing with lower rib cage expansion then into the abdomen in supine and sitting     PATIENT EDUCATION: 08/19/22 Education details: Access Code: [email or web address] Person educated: Patient Education method: Explanation, Demonstration, Tactile cues, Verbal cues, and Handouts Education comprehension: verbalized understanding, returned demonstration, verbal cues required, tactile cues required, and needs further education       HOME EXERCISE PROGRAM: 08/19/22 Access Code: [email or web address] URL: [email or web address] Date: 08/19/2022 Prepared by: Eulis Foster  Exercises - - Single Leg Bridge  - 1 x daily - 7 x weekly - 3 sets - 10 reps      ASSESSMENT:   CLINICAL IMPRESSION: Patient is a 47 y.o. female who was seen today for physical therapy  treatment for dyspareunia. Patient is not having pain with lifting or bowel movement. Patient is able to contract the upper and lower abdominals the same. She has improved movement of the lumbar spine after manual work. Today therapy focused on opening the right lower quadrant. Patient will benefit from skilled therapy to improve tissue  mobility to reduce her pain and improve quality of life.    OBJECTIVE IMPAIRMENTS: decreased activity tolerance, decreased coordination, decreased strength, increased fascial restrictions, increased muscle spasms, and pain.    ACTIVITY LIMITATIONS: lifting and continence   PARTICIPATION LIMITATIONS: cleaning, interpersonal relationship, shopping, community activity, and occupation   PERSONAL FACTORS: Fitness, Past/current experiences, Time since onset of injury/illness/exacerbation, and 3+ comorbidities: Colon Resection by the ileocecal valve removing large and small intestines and appendix; TMJ; Chron's disease; Endometrial mass  are also affecting patient's functional outcome.    REHAB POTENTIAL: Excellent   CLINICAL DECISION MAKING: Evolving/moderate complexity   EVALUATION COMPLEXITY: Moderate     GOALS: Goals reviewed with patient? Yes   SHORT TERM GOALS: Target date: 08/15/22   Patient independent with perineal massage.  Baseline: Goal status: Met 08/19/22   2.  Patient independent with manual work on the right lower quadrant to improve tissue mobility.  Baseline:  Goal status: Met 08/12/22   3.  Patient independent with HEP for stretches and diaphragmatic breathing.  Baseline:  Goal status: Met 08/19/22   4.  Patient able to contract the lower abdominal without bulging.  Baseline:  Goal status: Met  08/19/22       LONG TERM GOALS: Target date: 10/11/22   Patient independent with advanced HEP.  Baseline:  Goal status: INITIAL   2.  Patient able to have penile penetration vaginally with no pain due to improved mobility of the tissue.  Baseline:  Goal status: INITIAL   3.  Pelvic floor strength >/= 4/5 holding for 10 seconds due to improved perineal body mobility and introitus.  Baseline:  Goal status: INITIAL   4.  Patient able to lift items with correct body mechanics with no pain due to improve lower abdominal contraction.  Baseline:  Goal status: INITIAL    5.  Patient is able to have a bowel movement with correct toileting technique and no pain due to improve tissue mobility.  Baseline:  Goal status: INITIAL     PLAN:   PT FREQUENCY: 1x/week   PT DURATION: 12 weeks   PLANNED INTERVENTIONS: Therapeutic exercises, Therapeutic activity, Neuromuscular re-education, Patient/Family education, Joint mobilization, Dry  Needling, Electrical stimulation, Spinal mobilization, Cryotherapy, Moist heat, scar mobilization, Ultrasound, Biofeedback, and Manual therapy   PLAN FOR NEXT SESSION: manual work to the pelvic floor and right side of the bladder, core strength  Eulis Foster, PT 08/19/22 4:59 PM

## 2022-08-20 ENCOUNTER — Other Ambulatory Visit (HOSPITAL_BASED_OUTPATIENT_CLINIC_OR_DEPARTMENT_OTHER): Payer: Self-pay

## 2022-08-22 ENCOUNTER — Other Ambulatory Visit (HOSPITAL_BASED_OUTPATIENT_CLINIC_OR_DEPARTMENT_OTHER): Payer: Self-pay

## 2022-08-22 ENCOUNTER — Encounter: Payer: Self-pay | Admitting: Gastroenterology

## 2022-08-23 ENCOUNTER — Other Ambulatory Visit (HOSPITAL_BASED_OUTPATIENT_CLINIC_OR_DEPARTMENT_OTHER): Payer: Self-pay

## 2022-08-25 ENCOUNTER — Encounter: Payer: Self-pay | Admitting: Family Medicine

## 2022-08-26 ENCOUNTER — Other Ambulatory Visit (HOSPITAL_BASED_OUTPATIENT_CLINIC_OR_DEPARTMENT_OTHER): Payer: Self-pay

## 2022-08-26 ENCOUNTER — Ambulatory Visit: Payer: 59 | Admitting: Family Medicine

## 2022-09-04 ENCOUNTER — Ambulatory Visit: Payer: 59 | Admitting: Physical Therapy

## 2022-09-04 ENCOUNTER — Other Ambulatory Visit (HOSPITAL_BASED_OUTPATIENT_CLINIC_OR_DEPARTMENT_OTHER): Payer: Self-pay

## 2022-09-05 ENCOUNTER — Encounter: Payer: Self-pay | Admitting: Family Medicine

## 2022-09-09 ENCOUNTER — Encounter: Payer: Self-pay | Admitting: Physical Therapy

## 2022-09-09 ENCOUNTER — Ambulatory Visit: Payer: 59 | Attending: Obstetrics and Gynecology | Admitting: Physical Therapy

## 2022-09-09 DIAGNOSIS — M62838 Other muscle spasm: Secondary | ICD-10-CM | POA: Diagnosis not present

## 2022-09-09 DIAGNOSIS — R1031 Right lower quadrant pain: Secondary | ICD-10-CM | POA: Insufficient documentation

## 2022-09-09 DIAGNOSIS — M6281 Muscle weakness (generalized): Secondary | ICD-10-CM

## 2022-09-09 DIAGNOSIS — R102 Pelvic and perineal pain: Secondary | ICD-10-CM

## 2022-09-09 NOTE — Therapy (Signed)
OUTPATIENT PHYSICAL THERAPY TREATMENT NOTE   Patient Name: Susan Montoya MRN: [id number] DOB:12-Aug-1975, 47 y.o., female Today's Date: 09/09/2022  PCP: Sharlene Dory, DO  REFERRING PROVIDER: Maxie Better, MD   END OF SESSION:   PT End of Session - 09/09/22 0803     Visit Number 5    Date for PT Re-Evaluation 10/11/22    Authorization Type cone    PT Start Time 0800    PT Stop Time 0840    PT Time Calculation (min) 40 min    Activity Tolerance Patient tolerated treatment well    Behavior During Therapy [facility] for tasks assessed/performed             Past Medical History:  Diagnosis Date   Abnormal uterine bleeding (AUB)    Beta thalassemia minor    02-17-2019  per pt dx as child at age 71, was tested due to family history   Crohn's disease (HCC) GI--- dr Loreta [address]   no current med.----   s/p  colon resection 07/ 2007   Endometrial mass    GAD (generalized anxiety disorder)    HSV (herpes simplex virus) anogenital infection    HSV 2  (02-17-2019  per pt currently no flare)   TMJ syndrome    right side  (02-17-2019  per pt has had not issues in awhile)   Wears glasses    Past Surgical History:  Procedure Laterality Date   COLON RESECTION  07/ 2007--- age 76   terminal ileum and right colon w/ appendix   COLONOSCOPY WITH PROPOFOL  last one 05-09-2010   DILATATION & CURETTAGE/HYSTEROSCOPY WITH MYOSURE N/A 02/18/2019   Procedure: DILATATION & CURETTAGE/HYSTEROSCOPY WITH MYOSURE;  Surgeon: Maxie Better, MD;  Location: [facility];  Service: Gynecology;  Laterality: N/A;   LAPAROSCOPIC TUBAL LIGATION Bilateral 11/07/2014   Procedure: LAPAROSCOPIC  BILATERAL TUBAL LIGATION with cautery and fulgeration of endmetrios;  Surgeon: Maxie Better, MD;  Location: [facility];  Service: Gynecology;  Laterality: Bilateral;   TONSILLECTOMY N/A 10/08/2013   Procedure: TONSILLECTOMY;  Surgeon: Drema Halon, MD;  Location: [facility];   Service: ENT;  Laterality: N/A;   WISDOM TOOTH EXTRACTION     Patient Active Problem List   Diagnosis Date Noted   Migraine 11/02/2021   Lateral epicondylitis, right elbow 01/24/2021   Nonallopathic lesion of sacral region 02/09/2020   Hip flexor tendon tightness, unspecified laterality 12/17/2019   Seasonal allergic rhinitis due to pollen 07/08/2018   Seasonal allergic conjunctivitis 07/08/2018   Crohn's disease of both small and large intestine without complication (HCC) 07/08/2018   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   Herpes simplex type 2 infection 06/20/2015   GAD (generalized anxiety disorder) 06/20/2015   Beta-thalassemia (HCC) 08/18/2014   Postpartum care following vaginal delivery (4/19) 08/16/2014   Decreased fetal movement during pregnancy in third trimester, antepartum 08/07/2014   Irregular uterine contractions 08/07/2014   Crohn's disease (HCC) 09/22/2007   REFERRING DIAG: Dyspareunia in female N94.10   THERAPY DIAG:  Muscle weakness (generalized)   Pelvic pain   Right lower quadrant pain   Other muscle spasm   Rationale for Evaluation and Treatment: Rehabilitation   ONSET DATE: 2020   SUBJECTIVE:  SUBJECTIVE STATEMENT: Still have pain with certain positions. Deeper penetration increased pain. Pain at the cervix.     PAIN:  Are you having pain? Yes NPRS scale: 6/10 average  Pain location:  internal on the right side   Pain type: spasm Pain description: intermittent    Aggravating factors: deep penetration, pain with partner on top Relieving factors: stop the activity, heat sometimes, no pain with partner from behind.    PRECAUTIONS: None   WEIGHT BEARING RESTRICTIONS: No   FALLS:  Has patient fallen in last 6 months? Yes. Number of falls fell down the stairs due to stairs  being slippery   LIVING ENVIRONMENT: Lives with: lives with their family   OCCUPATION: labor and delivery nurse   PLOF: Independent   PATIENT GOALS: reduce pain   PERTINENT HISTORY:  Colon Resection by the ileocecal valve removing large and small intestines and appendix; TMJ; Chron's disease; Endometrial mass     BOWEL MOVEMENT: Pain with bowel movement: Yes Type of bowel movement:Type (Bristol Stool Scale) loose, Frequency daily , Strain No, and Splinting no Fully empty rectum: Yes:   Leakage: No Pads: No Fiber supplement: Yes: probiotic   URINATION: Pain with urination: No Fully empty bladder: Yes:   Stream: Strong Urgency: No Frequency: average Leakage: none    INTERCOURSE: Pain with intercourse: During Penetration with deep penetration Ability to have vaginal penetration:  Yes:   Climax: not with partner, able to climax with external stimulation like a vibrator Marinoff Scale: 1/3   PREGNANCY: Vaginal deliveries 3 Tearing Yes: and had stitches   PROLAPSE: None     OBJECTIVE:    DIAGNOSTIC FINDINGS:  CT was negative     COGNITION: Overall cognitive status: Within functional limits for tasks assessed                          SENSATION: Light touch: Appears intact Proprioception: Appears intact     POSTURE: No Significant postural limitations   PELVIC ALIGNMENT: Pelvis in correct alignment   LUMBARAROM/PROM:   A/PROM A/PROM  eval  Left lateral flexion Decreased by 25%  Left rotation Decreased by 25%   (Blank rows = not tested)   LOWER EXTREMITY ZOX:[id number] hip ROM is full     LOWER EXTREMITY MMT:   MMT Right eval Left eval  Hip extension 4/5 4/5  Hip abduction 4/5 4-/5    PALPATION:   General  decreased tissue mobility of the right abdominal, bulges the lower abdomen when contracting the abdominals,                  External Perineal Exam tightness in the perineal body with decreased mobility                              Internal Pelvic Floor restrictions on the right side of the bladder, along the ovary, restrictions of the right side of the cervix, decreased contraction of the right urethra sphincter, tightness of the right introitus and posterior fourchette, decreased mobility of the right iliococcygeus and obturator internist   Patient confirms identification and approves PT to assess internal pelvic floor and treatment Yes   PELVIC MMT:   MMT eval 08/12/22 09/09/22  Vaginal 3/5 with weak hug 3/5 4/5  (Blank rows = not tested)         TONE: Increased on the right       TODAY'S TREATMENT:  09/09/22 Manual: Internal pelvic floor techniques: No emotional/communication barriers or cognitive limitation. Patient is motivated to learn. Patient understands and agrees with treatment goals and plan. PT explains patient will be examined in standing, sitting, and lying down to see how their muscles and joints work. When they are ready, they will be asked to remove their underwear so PT can examine their perineum. The patient is also given the option of providing their own chaperone as one is not provided in our facility. The patient also has the right and is explained the right to defer or refuse any part of the evaluation or treatment including the internal exam. With the patient's consent, PT will use one gloved finger to gently assess the muscles of the pelvic floor, seeing how well it contracts and relaxes and if there is muscle symmetry. After, the patient will get dressed and PT and patient will discuss exam findings and plan of care. PT and patient discuss plan of care, schedule, attendance policy and HEP activities.  Going through the vaginal canal working on the iliococcygeus, obturator internist, puborectalis with hip movements Going through the vaginal canal work releasing around the superior bladder, release along the anterior and right side of the cervix Exercises: Stretches/mobility: Educated patient on  the [place] to assist with less penetration to work on pain management     08/19/22 Manual: Soft tissue mobilization: To assess for dry needling Manual work to the right psoas to elongate after dry needling Manual work to left quadratus  mobilization: Anterior glide of right femoral head in prone grade 3 with hip extension Right SI joint mobilization with sacrum stabilized Gapping of the right L3-L5 in sidely Trigger Point Dry-Needling  Treatment instructions: Expect mild to moderate muscle soreness. S/S of pneumothorax if dry needled over a lung field, and to seek immediate medical attention should they occur. Patient verbalized understanding of these instructions and education.   Patient Consent Given: Yes Education handout provided: Previously provided Muscles treated: right psoas muscles Electrical stimulation performed: No Parameters: N/A Treatment response/outcome: elongation of muscles and trigger point response   Exercises: Stretches/mobility: Prone on ball in the right psoas area to elongate the tissue and reduce trigger points.  Prone on ball and IR and ER right leg to massage the psoas Prone on ball and extend the right leg then roll onto the right hip Z stretch of hips and lift upward to stretch the anterior hip  Prone right hip flexor stretch grabbing the right foot Strengthening: One legged bridge 15x on each Quadruped with right knee on yoga block and lift left hip up 15x then rock forward and back     08/12/22 Manual: Soft tissue mobilization: Manually work to the diaphragm Internal pelvic floor techniques: No emotional/communication barriers or cognitive limitation. Patient is motivated to learn. Patient understands and agrees with treatment goals and plan. PT explains patient will be examined in standing, sitting, and lying down to see how their muscles and joints work. When they are ready, they will be asked to remove their underwear so PT can examine their  perineum. The patient is also given the option of providing their own chaperone as one is not provided in our facility. The patient also has the right and is explained the right to defer or refuse any part of the evaluation or treatment including the internal exam. With the patient's consent, PT will use one gloved finger to gently assess the muscles of the pelvic floor, seeing how well it contracts and  relaxes and if there is muscle symmetry. After, the patient will get dressed and PT and patient will discuss exam findings and plan of care. PT and patient discuss plan of care, schedule, attendance policy and HEP activities.  Going through the vagina and working on the introitus, along the anterior right cervix, just above the bladder Neuromuscular re-education: Core retraining: Hookly with abdominal contraction, patient hand on the abdomen to feel the lower abdominals contract equally with the upper and ball squeeze Sidely abdominal contraction with pressing on ball to engage the lower abdomen 5 times each side Quadruped contract the transverse abdominus with ball squeeze  Pelvic floor contraction training: Tapping of the lateral pelvic floor muscles to contract Down training: Diaphragmatic  breathing with the therapist guiding the rib cage to open and close     PATIENT EDUCATION: 08/19/22 Education details: Access Code: [email or web address] Person educated: Patient Education method: Explanation, Demonstration, Tactile cues, Verbal cues, and Handouts Education comprehension: verbalized understanding, returned demonstration, verbal cues required, tactile cues required, and needs further education       HOME EXERCISE PROGRAM: 08/19/22 Access Code: [email or web address] URL: [email or web address] Date: 08/19/2022 Prepared by: Eulis Foster   Exercises - - Single Leg Bridge  - 1 x daily - 7 x weekly - 3 sets - 10 reps       ASSESSMENT:   CLINICAL IMPRESSION: Patient is a 47 y.o. female who was  seen today for physical therapy  treatment for dyspareunia. No urinary leakage with coughing and sneezing. No pain with bowel movements or lifting items. Pelvic floor strength is 4/5. She has improved mobility of the cervix and superior bladder region. Patient will benefit from skilled therapy to improve tissue mobility to reduce her pain and improve quality of life.    OBJECTIVE IMPAIRMENTS: decreased activity tolerance, decreased coordination, decreased strength, increased fascial restrictions, increased muscle spasms, and pain.    ACTIVITY LIMITATIONS: lifting and continence   PARTICIPATION LIMITATIONS: cleaning, interpersonal relationship, shopping, community activity, and occupation   PERSONAL FACTORS: Fitness, Past/current experiences, Time since onset of injury/illness/exacerbation, and 3+ comorbidities: Colon Resection by the ileocecal valve removing large and small intestines and appendix; TMJ; Chron's disease; Endometrial mass  are also affecting patient's functional outcome.    REHAB POTENTIAL: Excellent   CLINICAL DECISION MAKING: Evolving/moderate complexity   EVALUATION COMPLEXITY: Moderate     GOALS: Goals reviewed with patient? Yes   SHORT TERM GOALS: Target date: 08/15/22   Patient independent with perineal massage.  Baseline: Goal status: Met 08/19/22   2.  Patient independent with manual work on the right lower quadrant to improve tissue mobility.  Baseline:  Goal status: Met 08/12/22   3.  Patient independent with HEP for stretches and diaphragmatic breathing.  Baseline:  Goal status: Met 08/19/22   4.  Patient able to contract the lower abdominal without bulging.  Baseline:  Goal status: Met  08/19/22       LONG TERM GOALS: Target date: 10/11/22   Patient independent with advanced HEP.  Baseline:  Goal status: ongoing 09/09/22   2.  Patient able to have penile penetration vaginally with no pain due to improved mobility of the tissue.  Baseline: less pain  with initial penetration when use lubrication, pain with deeper penetration Goal status: ongoing 09/09/22   3.  Pelvic floor strength >/= 4/5 holding for 10 seconds due to improved perineal body mobility and introitus.  Baseline:  Goal status: ongoing 09/09/22   4.  Patient able to lift items with  correct body mechanics with no pain due to improve lower abdominal contraction.  Baseline:  Goal status: Met 09/09/22   5.  Patient is able to have a bowel movement with correct toileting technique and no pain due to improve tissue mobility.  Baseline:  Goal status: Met 09/09/22     PLAN:   PT FREQUENCY: 1x/week   PT DURATION: 12 weeks   PLANNED INTERVENTIONS: Therapeutic exercises, Therapeutic activity, Neuromuscular re-education, Patient/Family education, Joint mobilization, Dry Needling, Electrical stimulation, Spinal mobilization, Cryotherapy, Moist heat, scar mobilization, Ultrasound, Biofeedback, and Manual therapy   PLAN FOR NEXT SESSION: manual work to the pelvic floor ,  core strength  Eulis Foster, PT 09/09/22 8:43 AM

## 2022-09-15 ENCOUNTER — Other Ambulatory Visit: Payer: Self-pay | Admitting: Adult Health

## 2022-09-15 ENCOUNTER — Other Ambulatory Visit (HOSPITAL_COMMUNITY): Payer: Self-pay

## 2022-09-15 DIAGNOSIS — F411 Generalized anxiety disorder: Secondary | ICD-10-CM

## 2022-09-16 ENCOUNTER — Other Ambulatory Visit (HOSPITAL_COMMUNITY): Payer: Self-pay

## 2022-09-16 ENCOUNTER — Encounter: Payer: Self-pay | Admitting: Physical Therapy

## 2022-09-16 ENCOUNTER — Other Ambulatory Visit: Payer: Self-pay

## 2022-09-16 ENCOUNTER — Ambulatory Visit: Payer: 59 | Admitting: Physical Therapy

## 2022-09-16 DIAGNOSIS — M6281 Muscle weakness (generalized): Secondary | ICD-10-CM

## 2022-09-16 DIAGNOSIS — R102 Pelvic and perineal pain: Secondary | ICD-10-CM | POA: Diagnosis not present

## 2022-09-16 DIAGNOSIS — M62838 Other muscle spasm: Secondary | ICD-10-CM

## 2022-09-16 DIAGNOSIS — R1031 Right lower quadrant pain: Secondary | ICD-10-CM | POA: Diagnosis not present

## 2022-09-16 DIAGNOSIS — F4323 Adjustment disorder with mixed anxiety and depressed mood: Secondary | ICD-10-CM | POA: Diagnosis not present

## 2022-09-16 NOTE — Therapy (Signed)
OUTPATIENT PHYSICAL THERAPY TREATMENT NOTE   Patient Name: Susan Montoya MRN: [id number] DOB:1975-09-17, 47 y.o., female Today's Date: 09/16/2022  PCP: Sharlene Dory, DO  REFERRING PROVIDER: Maxie Better, MD   END OF SESSION:   PT End of Session - 09/16/22 0759     Visit Number 6    Date for PT Re-Evaluation 10/11/22    Authorization Type cone    PT Start Time 0800    PT Stop Time 0840    PT Time Calculation (min) 40 min    Activity Tolerance Patient tolerated treatment well    Behavior During Therapy [facility] for tasks assessed/performed             Past Medical History:  Diagnosis Date   Abnormal uterine bleeding (AUB)    Beta thalassemia minor    02-17-2019  per pt dx as child at age 17, was tested due to family history   Crohn's disease (HCC) GI--- dr Loreta [address]   no current med.----   s/p  colon resection 07/ 2007   Endometrial mass    GAD (generalized anxiety disorder)    HSV (herpes simplex virus) anogenital infection    HSV 2  (02-17-2019  per pt currently no flare)   TMJ syndrome    right side  (02-17-2019  per pt has had not issues in awhile)   Wears glasses    Past Surgical History:  Procedure Laterality Date   COLON RESECTION  07/ 2007--- age 58   terminal ileum and right colon w/ appendix   COLONOSCOPY WITH PROPOFOL  last one 05-09-2010   DILATATION & CURETTAGE/HYSTEROSCOPY WITH MYOSURE N/A 02/18/2019   Procedure: DILATATION & CURETTAGE/HYSTEROSCOPY WITH MYOSURE;  Surgeon: Maxie Better, MD;  Location: [facility] [place];  Service: Gynecology;  Laterality: N/A;   LAPAROSCOPIC TUBAL LIGATION Bilateral 11/07/2014   Procedure: LAPAROSCOPIC  BILATERAL TUBAL LIGATION with cautery and fulgeration of endmetrios;  Surgeon: Maxie Better, MD;  Location: [facility];  Service: Gynecology;  Laterality: Bilateral;   TONSILLECTOMY N/A 10/08/2013   Procedure: TONSILLECTOMY;  Surgeon: Drema Halon, MD;  Location: [facility];   Service: ENT;  Laterality: N/A;   WISDOM TOOTH EXTRACTION     Patient Active Problem List   Diagnosis Date Noted   Migraine 11/02/2021   Lateral epicondylitis, right elbow 01/24/2021   Nonallopathic lesion of sacral region 02/09/2020   Hip flexor tendon tightness, unspecified laterality 12/17/2019   Seasonal allergic rhinitis due to pollen 07/08/2018   Seasonal allergic conjunctivitis 07/08/2018   Crohn's disease of both small and large intestine without complication (HCC) 07/08/2018   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   Herpes simplex type 2 infection 06/20/2015   GAD (generalized anxiety disorder) 06/20/2015   Beta-thalassemia (HCC) 08/18/2014   Postpartum care following vaginal delivery (4/19) 08/16/2014   Decreased fetal movement during pregnancy in third trimester, antepartum 08/07/2014   Irregular uterine contractions 08/07/2014   Crohn's disease (HCC) 09/22/2007   REFERRING DIAG: Dyspareunia in female N94.10   THERAPY DIAG:  Muscle weakness (generalized)   Pelvic pain   Right lower quadrant pain   Other muscle spasm   Rationale for Evaluation and Treatment: Rehabilitation   ONSET DATE: 2020   SUBJECTIVE:  SUBJECTIVE STATEMENT: I got the  ohnut. I felt a difference after last visit. 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 I pulled something where the scar tissue is and released faster than it normally does.       PAIN:  Are you having pain? Yes NPRS scale: 6/10 average  Pain location:  internal on the right side   Pain type: spasm Pain description: intermittent    Aggravating factors: deep penetration, pain with partner on top Relieving factors: stop the activity, heat sometimes, no pain with partner from behind.    PRECAUTIONS: None   WEIGHT BEARING RESTRICTIONS: No    FALLS:  Has patient fallen in last 6 months? Yes. Number of falls fell down the stairs due to stairs being slippery   LIVING ENVIRONMENT: Lives with: lives with their family   OCCUPATION: labor and delivery nurse   PLOF: Independent   PATIENT GOALS: reduce pain   PERTINENT HISTORY:  Colon Resection by the ileocecal valve removing large and small intestines and appendix; TMJ; Chron's disease; Endometrial mass     BOWEL MOVEMENT: Pain with bowel movement: Yes Type of bowel movement:Type (Bristol Stool Scale) loose, Frequency daily , Strain No, and Splinting no Fully empty rectum: Yes:   Leakage: No Pads: No Fiber supplement: Yes: probiotic   URINATION: Pain with urination: No Fully empty bladder: Yes:   Stream: Strong Urgency: No Frequency: average Leakage: none    INTERCOURSE: Pain with intercourse: During Penetration with deep penetration Ability to have vaginal penetration:  Yes:   Climax: not with partner, able to climax with external stimulation like a vibrator Marinoff Scale: 1/3   PREGNANCY: Vaginal deliveries 3 Tearing Yes: and had stitches   PROLAPSE: None     OBJECTIVE:    DIAGNOSTIC FINDINGS:  CT was negative     COGNITION: Overall cognitive status: Within functional limits for tasks assessed                          SENSATION: Light touch: Appears intact Proprioception: Appears intact     POSTURE: No Significant postural limitations   PELVIC ALIGNMENT: Pelvis in correct alignment   LUMBARAROM/PROM:   A/PROM A/PROM  eval 09/16/22  Left lateral flexion Decreased by 25% full  Left rotation Decreased by 25% full   (Blank rows = not tested)   LOWER EXTREMITY NWG:[id number] hip ROM is full     LOWER EXTREMITY MMT:   MMT Right eval Left eval Right  09/16/22 Left 09/16/22  Hip extension 4/5 4/5 4/5 4/5  Hip abduction 4/5 4-/5 4/5 4/5    PALPATION:   General  decreased tissue mobility of the right abdominal, bulges the lower abdomen  when contracting the abdominals,                  External Perineal Exam tightness in the perineal body with decreased mobility                             Internal Pelvic Floor restrictions on the right side of the bladder, along the ovary, restrictions of the right side of the cervix, decreased contraction of the right urethra sphincter, tightness of the right introitus and posterior fourchette, decreased mobility of the right iliococcygeus and obturator internist   Patient confirms identification and approves PT to assess internal pelvic floor and treatment Yes   PELVIC MMT:   MMT eval 08/12/22  09/09/22  Vaginal 3/5 with weak hug 3/5 4/5  (Blank rows = not tested)         TONE: Increased on the right       TODAY'S TREATMENT:   09/16/22 Manual: Soft tissue mobilization: To assess for dry needling Manual work to the rectus abdominus and diaphragm Scar tissue mobilization: Myofascial release: Using the suction cup to release the fascia Trigger Point Dry-Needling  Treatment instructions: Expect mild to moderate muscle soreness. S/S of pneumothorax if dry needled over a lung field, and to seek immediate medical attention should they occur. Patient verbalized understanding of these instructions and education.   Patient Consent Given: Yes Education handout provided: Previously provided Muscles treated: bilateral sides of the rectus Electrical stimulation performed: No Parameters: N/A Treatment response/outcome: elongation of muscles and trigger point response Neuromuscular re-education: Core retraining: Hookly with ball squeeze and equal contraction of upper and lower abdominals.  Squat lift 10# with core engagement using tactile cues to the lower abdomen and verbal cues Dead lift with core engagement with 10 #  1/2 kneel pulling green band in diagonal to the hip with leg forward with tactile cues to the obliques  Standing moving 5# side to side and diagonals     09/09/22 Manual: Internal pelvic floor techniques: No emotional/communication barriers or cognitive limitation. Patient is motivated to learn. Patient understands and agrees with treatment goals and plan. PT explains patient will be examined in standing, sitting, and lying down to see how their muscles and joints work. When they are ready, they will be asked to remove their underwear so PT can examine their perineum. The patient is also given the option of providing their own chaperone as one is not provided in our facility. The patient also has the right and is explained the right to defer or refuse any part of the evaluation or treatment including the internal exam. With the patient's consent, PT will use one gloved finger to gently assess the muscles of the pelvic floor, seeing how well it contracts and relaxes and if there is muscle symmetry. After, the patient will get dressed and PT and patient will discuss exam findings and plan of care. PT and patient discuss plan of care, schedule, attendance policy and HEP activities.  Going through the vaginal canal working on the iliococcygeus, obturator internist, puborectalis with hip movements Going through the vaginal canal work releasing around the superior bladder, release along the anterior and right side of the cervix Exercises: Stretches/mobility: Educated patient on the Ohnut to assist with less penetration to work on pain management     08/19/22 Manual: Soft tissue mobilization: To assess for dry needling Manual work to the right psoas to elongate after dry needling Manual work to left quadratus  mobilization: Anterior glide of right femoral head in prone grade 3 with hip extension Right SI joint mobilization with sacrum stabilized Gapping of the right L3-L5 in sidely Trigger Point Dry-Needling  Treatment instructions: Expect mild to moderate muscle soreness. S/S of pneumothorax if dry needled over a lung field, and to seek immediate  medical attention should they occur. Patient verbalized understanding of these instructions and education.   Patient Consent Given: Yes Education handout provided: Previously provided Muscles treated: right psoas muscles Electrical stimulation performed: No Parameters: N/A Treatment response/outcome: elongation of muscles and trigger point response   Exercises: Stretches/mobility: Prone on ball in the right psoas area to elongate the tissue and reduce trigger points.  Prone on ball and IR and ER  right leg to massage the psoas Prone on ball and extend the right leg then roll onto the right hip Z stretch of hips and lift upward to stretch the anterior hip  Prone right hip flexor stretch grabbing the right foot Strengthening: One legged bridge 15x on each Quadruped with right knee on yoga block and lift left hip up 15x then rock forward and back     08/12/22 Manual: Soft tissue mobilization: Manually work to the diaphragm Internal pelvic floor techniques: No emotional/communication barriers or cognitive limitation. Patient is motivated to learn. Patient understands and agrees with treatment goals and plan. PT explains patient will be examined in standing, sitting, and lying down to see how their muscles and joints work. When they are ready, they will be asked to remove their underwear so PT can examine their perineum. The patient is also given the option of providing their own chaperone as one is not provided in our facility. The patient also has the right and is explained the right to defer or refuse any part of the evaluation or treatment including the internal exam. With the patient's consent, PT will use one gloved finger to gently assess the muscles of the pelvic floor, seeing how well it contracts and relaxes and if there is muscle symmetry. After, the patient will get dressed and PT and patient will discuss exam findings and plan of care. PT and patient discuss plan of care, schedule,  attendance policy and HEP activities.  Going through the vagina and working on the introitus, along the anterior right cervix, just above the bladder Neuromuscular re-education: Core retraining: Hookly with abdominal contraction, patient hand on the abdomen to feel the lower abdominals contract equally with the upper and ball squeeze Sidely abdominal contraction with pressing on ball to engage the lower abdomen 5 times each side Quadruped contract the transverse abdominus with ball squeeze  Pelvic floor contraction training: Tapping of the lateral pelvic floor muscles to contract Down training: Diaphragmatic  breathing with the therapist guiding the rib cage to open and close     PATIENT EDUCATION: 08/19/22 Education details: Access Code: [email or web address] Person educated: Patient Education method: Explanation, Demonstration, Tactile cues, Verbal cues, and Handouts Education comprehension: verbalized understanding, returned demonstration, verbal cues required, tactile cues required, and needs further education       HOME EXERCISE PROGRAM: 08/19/22 Access Code: [email or web address] URL: [email or web address] Date: 08/19/2022 Prepared by: Eulis Foster   Exercises - - Single Leg Bridge  - 1 x daily - 7 x weekly - 3 sets - 10 reps       ASSESSMENT:   CLINICAL IMPRESSION: Patient is a 48 y.o. female who was seen today for physical therapy  treatment for dyspareunia. Patient had multiple trigger points in the rectus abdominus. She will bulge her lower abdomen when she laughs and during abdominal contraction. Patient is learning how to engage her core with lifting and daily activities to reduce strain. She has increased lumbar ROM. Sh has gotten the ohnut to use with vaginal penetration for pain reduction. Patient will benefit from skilled therapy to improve tissue mobility to reduce her pain and improve quality of life.    OBJECTIVE IMPAIRMENTS: decreased activity tolerance, decreased  coordination, decreased strength, increased fascial restrictions, increased muscle spasms, and pain.    ACTIVITY LIMITATIONS: lifting and continence   PARTICIPATION LIMITATIONS: cleaning, interpersonal relationship, shopping, community activity, and occupation   PERSONAL FACTORS: Fitness, Past/current experiences, Time since onset of injury/illness/exacerbation, and 3+ comorbidities: Colon Resection by the  ileocecal valve removing large and small intestines and appendix; TMJ; Chron's disease; Endometrial mass  are also affecting patient's functional outcome.    REHAB POTENTIAL: Excellent   CLINICAL DECISION MAKING: Evolving/moderate complexity   EVALUATION COMPLEXITY: Moderate     GOALS: Goals reviewed with patient? Yes   SHORT TERM GOALS: Target date: 08/15/22   Patient independent with perineal massage.  Baseline: Goal status: Met 08/19/22   2.  Patient independent with manual work on the right lower quadrant to improve tissue mobility.  Baseline:  Goal status: Met 08/12/22   3.  Patient independent with HEP for stretches and diaphragmatic breathing.  Baseline:  Goal status: Met 08/19/22   4.  Patient able to contract the lower abdominal without bulging.  Baseline:  Goal status: Met  08/19/22       LONG TERM GOALS: Target date: 10/11/22   Patient independent with advanced HEP.  Baseline:  Goal status: ongoing 09/09/22   2.  Patient able to have penile penetration vaginally with no pain due to improved mobility of the tissue.  Baseline: less pain with initial penetration when use lubrication, pain with deeper penetration Goal status: ongoing 09/09/22   3.  Pelvic floor strength >/= 4/5 holding for 10 seconds due to improved perineal body mobility and introitus.  Baseline:  Goal status: ongoing 09/09/22   4.  Patient able to lift items with correct body mechanics with no pain due to improve lower abdominal contraction.  Baseline:  Goal status: Met 09/09/22   5.  Patient  is able to have a bowel movement with correct toileting technique and no pain due to improve tissue mobility.  Baseline:  Goal status: Met 09/09/22     PLAN:   PT FREQUENCY: 1x/week   PT DURATION: 12 weeks   PLANNED INTERVENTIONS: Therapeutic exercises, Therapeutic activity, Neuromuscular re-education, Patient/Family education, Joint mobilization, Dry Needling, Electrical stimulation, Spinal mobilization, Cryotherapy, Moist heat, scar mobilization, Ultrasound, Biofeedback, and Manual therapy   PLAN FOR NEXT SESSION: core strength to engage upper and lower abdominals equally, see how intercourse is going, see if abdominals are feeling better.   Eulis Foster, PT 09/16/22 8:43 AM

## 2022-09-19 ENCOUNTER — Telehealth: Payer: Self-pay | Admitting: Family Medicine

## 2022-09-19 NOTE — Telephone Encounter (Signed)
Pt came in to today to drop off fmla papers so she can take care of her son during his hospital stay. She would like a call or message once papers have been faxed. Placed in pcp's folder upfront.

## 2022-09-24 NOTE — Telephone Encounter (Signed)
FMLA completed by PCP and faxed. Patient made aware

## 2022-09-25 ENCOUNTER — Ambulatory Visit: Payer: 59 | Admitting: Physical Therapy

## 2022-09-25 ENCOUNTER — Encounter: Payer: Self-pay | Admitting: Physical Therapy

## 2022-09-25 ENCOUNTER — Other Ambulatory Visit (HOSPITAL_COMMUNITY): Payer: Self-pay

## 2022-09-25 DIAGNOSIS — M6281 Muscle weakness (generalized): Secondary | ICD-10-CM | POA: Diagnosis not present

## 2022-09-25 DIAGNOSIS — R102 Pelvic and perineal pain: Secondary | ICD-10-CM | POA: Diagnosis not present

## 2022-09-25 DIAGNOSIS — R1031 Right lower quadrant pain: Secondary | ICD-10-CM | POA: Diagnosis not present

## 2022-09-25 DIAGNOSIS — M62838 Other muscle spasm: Secondary | ICD-10-CM | POA: Diagnosis not present

## 2022-09-25 NOTE — Therapy (Signed)
OUTPATIENT PHYSICAL THERAPY TREATMENT NOTE   Patient Name: Susan Montoya MRN: [id number] DOB:06-20-1975, 47 y.o., female Today's Date: 09/25/2022  PCP: Sharlene Dory, DO  REFERRING PROVIDER: Maxie Better, MD   END OF SESSION:   PT End of Session - 09/25/22 0757     Visit Number 7    Date for PT Re-Evaluation 10/11/22    Authorization Type cone    PT Start Time 0800    PT Stop Time 0840    PT Time Calculation (min) 40 min    Activity Tolerance Patient tolerated treatment well    Behavior During Therapy [facility] for tasks assessed/performed             Past Medical History:  Diagnosis Date   Abnormal uterine bleeding (AUB)    Beta thalassemia minor    02-17-2019  per pt dx as child at age 5, was tested due to family history   Crohn's disease (HCC) GI--- dr Loreta Ave   no current med.----   s/p  colon resection 07/ 2007   Endometrial mass    GAD (generalized anxiety disorder)    HSV (herpes simplex virus) anogenital infection    HSV 2  (02-17-2019  per pt currently no flare)   TMJ syndrome    right side  (02-17-2019  per pt has had not issues in awhile)   Wears glasses    Past Surgical History:  Procedure Laterality Date   COLON RESECTION  07/ 2007--- age 85   terminal ileum and right colon w/ appendix   COLONOSCOPY WITH PROPOFOL  last one 05-09-2010   DILATATION & CURETTAGE/HYSTEROSCOPY WITH MYOSURE N/A 02/18/2019   Procedure: DILATATION & CURETTAGE/HYSTEROSCOPY WITH MYOSURE;  Surgeon: Maxie Better, MD;  Location: [facility] [place];  Service: Gynecology;  Laterality: N/A;   LAPAROSCOPIC TUBAL LIGATION Bilateral 11/07/2014   Procedure: LAPAROSCOPIC  BILATERAL TUBAL LIGATION with cautery and fulgeration of endmetrios;  Surgeon: Maxie Better, MD;  Location: [facility];  Service: Gynecology;  Laterality: Bilateral;   TONSILLECTOMY N/A 10/08/2013   Procedure: TONSILLECTOMY;  Surgeon: Drema Halon, MD;  Location: [facility];   Service: ENT;  Laterality: N/A;   WISDOM TOOTH EXTRACTION     Patient Active Problem List   Diagnosis Date Noted   Migraine 11/02/2021   Lateral epicondylitis, right elbow 01/24/2021   Nonallopathic lesion of sacral region 02/09/2020   Hip flexor tendon tightness, unspecified laterality 12/17/2019   Seasonal allergic rhinitis due to pollen 07/08/2018   Seasonal allergic conjunctivitis 07/08/2018   Crohn's disease of both small and large intestine without complication (HCC) 07/08/2018   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   Herpes simplex type 2 infection 06/20/2015   GAD (generalized anxiety disorder) 06/20/2015   Beta-thalassemia (HCC) 08/18/2014   Postpartum care following vaginal delivery (4/19) 08/16/2014   Decreased fetal movement during pregnancy in third trimester, antepartum 08/07/2014   Irregular uterine contractions 08/07/2014   Crohn's disease (HCC) 09/22/2007   REFERRING DIAG: Dyspareunia in female N94.10   THERAPY DIAG:  Muscle weakness (generalized)   Pelvic pain   Right lower quadrant pain   Other muscle spasm   Rationale for Evaluation and Treatment: Rehabilitation   ONSET DATE: 2020   SUBJECTIVE:  SUBJECTIVE STATEMENT: Things are good. I have been walking my dog regularly now. I have not had the right internal pain. I have not had intercourse.     PAIN:  Are you having pain? Yes NPRS scale: 0/10 average  Pain location:  internal on the right side   Pain type: spasm Pain description: intermittent    Aggravating factors: deep penetration, pain with partner on top Relieving factors: stop the activity, heat sometimes, no pain with partner from behind.    PRECAUTIONS: None   WEIGHT BEARING RESTRICTIONS: No   FALLS:  Has patient fallen in last 6 months? Yes. Number of falls  fell down the stairs due to stairs being slippery   LIVING ENVIRONMENT: Lives with: lives with their family   OCCUPATION: labor and delivery nurse   PLOF: Independent   PATIENT GOALS: reduce pain   PERTINENT HISTORY:  Colon Resection by the ileocecal valve removing large and small intestines and appendix; TMJ; Chron's disease; Endometrial mass     BOWEL MOVEMENT: Pain with bowel movement: Yes Type of bowel movement:Type (Bristol Stool Scale) loose, Frequency daily , Strain No, and Splinting no Fully empty rectum: Yes:   Leakage: No Pads: No Fiber supplement: Yes: probiotic   URINATION: Pain with urination: No Fully empty bladder: Yes:   Stream: Strong Urgency: No Frequency: average Leakage: none    INTERCOURSE: Pain with intercourse: During Penetration with deep penetration Ability to have vaginal penetration:  Yes:   Climax: not with partner, able to climax with external stimulation like a vibrator Marinoff Scale: 1/3   PREGNANCY: Vaginal deliveries 3 Tearing Yes: and had stitches   PROLAPSE: None     OBJECTIVE:    DIAGNOSTIC FINDINGS:  CT was negative     COGNITION: Overall cognitive status: Within functional limits for tasks assessed                          SENSATION: Light touch: Appears intact Proprioception: Appears intact     POSTURE: No Significant postural limitations   PELVIC ALIGNMENT: Pelvis in correct alignment   LUMBARAROM/PROM:   A/PROM A/PROM  eval 09/16/22  Left lateral flexion Decreased by 25% full  Left rotation Decreased by 25% full   (Blank rows = not tested)   LOWER EXTREMITY XBM:[id number] hip ROM is full     LOWER EXTREMITY MMT:   MMT Right eval Left eval Right  09/16/22 Left 09/16/22  Hip extension 4/5 4/5 4/5 4/5  Hip abduction 4/5 4-/5 4/5 4/5      Patient confirms identification and approves PT to assess internal pelvic floor and treatment Yes   PELVIC MMT:   MMT eval 08/12/22 09/09/22  Vaginal 3/5 with  weak hug 3/5 4/5  (Blank rows = not tested)         TONE: Increased on the right       TODAY'S TREATMENT:   09/25/22 Manual: Soft tissue mobilization: To assess for dry needling Manual work to the rectus abdominus and diaphragm Trigger Point Dry-Needling  Treatment instructions: Expect mild to moderate muscle soreness. S/S of pneumothorax if dry needled over a lung field, and to seek immediate medical attention should they occur. Patient verbalized understanding of these instructions and education.   Patient Consent Given: Yes Education handout provided: Previously provided Muscles treated: bilateral sides of the rectus Electrical stimulation performed: No Parameters: N/A Treatment response/outcome: elongation of muscles and trigger point response Neuromuscular re-education: Down training: Diaphragmatic breathing in supine  and sitting with tactile cues to bring air into the lower abdomen and posterior rib cage Exercises: Stretches/mobility: Strengthening: Dead bug with verbal cues to keep the abdominals engaged 2 x 10 Single leg bridge with verbal cues to engage the abdominals and push the knee away from the body 10 x each side Bird dog with core engagement 10 x each way  09/16/22 Manual: Soft tissue mobilization: To assess for dry needling Manual work to the rectus abdominus and diaphragm Scar tissue mobilization: Myofascial release: Using the suction cup to release the fascia Trigger Point Dry-Needling  Treatment instructions: Expect mild to moderate muscle soreness. S/S of pneumothorax if dry needled over a lung field, and to seek immediate medical attention should they occur. Patient verbalized understanding of these instructions and education.   Patient Consent Given: Yes Education handout provided: Previously provided Muscles treated: bilateral sides of the rectus Electrical stimulation performed: No Parameters: N/A Treatment response/outcome: elongation of  muscles and trigger point response Neuromuscular re-education: Core retraining: Hookly with ball squeeze and equal contraction of upper and lower abdominals.  Squat lift 10# with core engagement using tactile cues to the lower abdomen and verbal cues Dead lift with core engagement with 10 #  1/2 kneel pulling green band in diagonal to the hip with leg forward with tactile cues to the obliques  Standing moving 5# side to side and diagonals    09/09/22 Manual: Internal pelvic floor techniques: No emotional/communication barriers or cognitive limitation. Patient is motivated to learn. Patient understands and agrees with treatment goals and plan. PT explains patient will be examined in standing, sitting, and lying down to see how their muscles and joints work. When they are ready, they will be asked to remove their underwear so PT can examine their perineum. The patient is also given the option of providing their own chaperone as one is not provided in our facility. The patient also has the right and is explained the right to defer or refuse any part of the evaluation or treatment including the internal exam. With the patient's consent, PT will use one gloved finger to gently assess the muscles of the pelvic floor, seeing how well it contracts and relaxes and if there is muscle symmetry. After, the patient will get dressed and PT and patient will discuss exam findings and plan of care. PT and patient discuss plan of care, schedule, attendance policy and HEP activities.  Going through the vaginal canal working on the iliococcygeus, obturator internist, puborectalis with hip movements Going through the vaginal canal work releasing around the superior bladder, release along the anterior and right side of the cervix Exercises: Stretches/mobility: Educated patient on the Ohnut to assist with less penetration to work on pain management     08/19/22 Manual: Soft tissue mobilization: To assess for dry  needling Manual work to the right psoas to elongate after dry needling Manual work to left quadratus  mobilization: Anterior glide of right femoral head in prone grade 3 with hip extension Right SI joint mobilization with sacrum stabilized Gapping of the right L3-L5 in sidely Trigger Point Dry-Needling  Treatment instructions: Expect mild to moderate muscle soreness. S/S of pneumothorax if dry needled over a lung field, and to seek immediate medical attention should they occur. Patient verbalized understanding of these instructions and education.   Patient Consent Given: Yes Education handout provided: Previously provided Muscles treated: right psoas muscles Electrical stimulation performed: No Parameters: N/A Treatment response/outcome: elongation of muscles and trigger point response  Exercises: Stretches/mobility: Prone on ball in the right psoas area to elongate the tissue and reduce trigger points.  Prone on ball and IR and ER right leg to massage the psoas Prone on ball and extend the right leg then roll onto the right hip Z stretch of hips and lift upward to stretch the anterior hip  Prone right hip flexor stretch grabbing the right foot Strengthening: One legged bridge 15x on each Quadruped with right knee on yoga block and lift left hip up 15x then rock forward and back       PATIENT EDUCATION: 08/19/22 Education details: Access Code: [email or web address] Person educated: Patient Education method: Programmer, multimedia, Demonstration, Actor cues, Verbal cues, and Handouts Education comprehension: verbalized understanding, returned demonstration, verbal cues required, tactile cues required, and needs further education       HOME EXERCISE PROGRAM: 08/19/22 Access Code: [email or web address] URL: [email or web address] Date: 08/19/2022 Prepared by: Eulis Foster   Exercises - - Single Leg Bridge  - 1 x daily - 7 x weekly - 3 sets - 10 reps       ASSESSMENT:   CLINICAL  IMPRESSION: Patient is a 47 y.o. female who was seen today for physical therapy  treatment for dyspareunia. She is not having the right sided pain anymore. Pelvic floor strength is 4/5. She is able to have a bowel movement without pain. She has not had vaginal penetration recently to see if discomfort but she does have the Ohnut to reduce the depth of the penetration. She understands how to lift items with correct body mechanics to reduce her pain. Patient is independent with her HEP.    OBJECTIVE IMPAIRMENTS: decreased activity tolerance, decreased coordination, decreased strength, increased fascial restrictions, increased muscle spasms, and pain.    ACTIVITY LIMITATIONS: lifting and continence   PARTICIPATION LIMITATIONS: cleaning, interpersonal relationship, shopping, community activity, and occupation   PERSONAL FACTORS: Fitness, Past/current experiences, Time since onset of injury/illness/exacerbation, and 3+ comorbidities: Colon Resection by the ileocecal valve removing large and small intestines and appendix; TMJ; Chron's disease; Endometrial mass  are also affecting patient's functional outcome.    REHAB POTENTIAL: Excellent   CLINICAL DECISION MAKING: Evolving/moderate complexity   EVALUATION COMPLEXITY: Moderate     GOALS: Goals reviewed with patient? Yes   SHORT TERM GOALS: Target date: 08/15/22   Patient independent with perineal massage.  Baseline: Goal status: Met 08/19/22   2.  Patient independent with manual work on the right lower quadrant to improve tissue mobility.  Baseline:  Goal status: Met 08/12/22   3.  Patient independent with HEP for stretches and diaphragmatic breathing.  Baseline:  Goal status: Met 08/19/22   4.  Patient able to contract the lower abdominal without bulging.  Baseline:  Goal status: Met  08/19/22       LONG TERM GOALS: Target date: 10/11/22   Patient independent with advanced HEP.  Baseline:  Goal status: Met 09/25/22   2.  Patient  able to have penile penetration vaginally with no pain due to improved mobility of the tissue.  Baseline: less pain with initial penetration when use lubrication, pain with deeper penetration Goal status: Met 09/25/22   3.  Pelvic floor strength >/= 4/5 holding for 10 seconds due to improved perineal body mobility and introitus.  Baseline:  Goal status: Met 09/25/22   4.  Patient able to lift items with correct body mechanics with no pain due to improve lower abdominal contraction.  Baseline:  Goal status: Met 09/09/22   5.  Patient  is able to have a bowel movement with correct toileting technique and no pain due to improve tissue mobility.  Baseline:  Goal status: Met 09/09/22     PLAN: Discharge to HEP today.     Eulis Foster, PT 09/25/22 8:41 AM  PHYSICAL THERAPY DISCHARGE SUMMARY  Visits from Start of Care: 7  Current functional level related to goals / functional outcomes: See above.    Remaining deficits: See above.    Education / Equipment: HEP   Patient agrees to discharge. Patient goals were met. Patient is being discharged due to meeting the stated rehab goals. Eulis Foster, PT 09/25/22 8:41 AM

## 2022-09-27 ENCOUNTER — Other Ambulatory Visit (HOSPITAL_COMMUNITY): Payer: Self-pay

## 2022-10-01 DIAGNOSIS — F4323 Adjustment disorder with mixed anxiety and depressed mood: Secondary | ICD-10-CM | POA: Diagnosis not present

## 2022-10-02 ENCOUNTER — Other Ambulatory Visit: Payer: Self-pay | Admitting: Adult Health

## 2022-10-02 ENCOUNTER — Other Ambulatory Visit (HOSPITAL_BASED_OUTPATIENT_CLINIC_OR_DEPARTMENT_OTHER): Payer: Self-pay

## 2022-10-02 ENCOUNTER — Encounter: Payer: 59 | Admitting: Physical Therapy

## 2022-10-02 ENCOUNTER — Other Ambulatory Visit: Payer: Self-pay | Admitting: Family Medicine

## 2022-10-02 DIAGNOSIS — F411 Generalized anxiety disorder: Secondary | ICD-10-CM

## 2022-10-02 MED ORDER — ESCITALOPRAM OXALATE 10 MG PO TABS
10.0000 mg | ORAL_TABLET | Freq: Every day | ORAL | 3 refills | Status: DC
  Filled 2022-10-02: qty 90, 90d supply, fill #0
  Filled 2022-12-10 – 2022-12-13 (×2): qty 90, 90d supply, fill #1

## 2022-10-09 ENCOUNTER — Ambulatory Visit: Payer: 59 | Admitting: Physical Therapy

## 2022-10-15 DIAGNOSIS — F4323 Adjustment disorder with mixed anxiety and depressed mood: Secondary | ICD-10-CM | POA: Diagnosis not present

## 2022-10-23 ENCOUNTER — Ambulatory Visit: Payer: 59 | Admitting: Gastroenterology

## 2022-11-04 DIAGNOSIS — F4323 Adjustment disorder with mixed anxiety and depressed mood: Secondary | ICD-10-CM | POA: Diagnosis not present

## 2022-11-12 ENCOUNTER — Telehealth: Payer: Self-pay

## 2022-11-12 NOTE — Telephone Encounter (Signed)
PA initiated via Covermymeds; KEY: [id number]. Awaiting determination.

## 2022-11-12 NOTE — Telephone Encounter (Signed)
 Patient aware.

## 2022-11-12 NOTE — Telephone Encounter (Signed)
PA approved.   The request has been approved. The authorization is effective from 11/12/2022 to 11/12/2023, as long as the member is enrolled in their current health plan. This has been approved for a max daily dosage of 0.6. A written notification letter will follow with additional details. Authorization Expiration Date: 11/11/2023

## 2022-11-13 DIAGNOSIS — D225 Melanocytic nevi of trunk: Secondary | ICD-10-CM | POA: Diagnosis not present

## 2022-11-13 DIAGNOSIS — L814 Other melanin hyperpigmentation: Secondary | ICD-10-CM | POA: Diagnosis not present

## 2022-11-13 DIAGNOSIS — Z7189 Other specified counseling: Secondary | ICD-10-CM | POA: Diagnosis not present

## 2022-11-13 DIAGNOSIS — L821 Other seborrheic keratosis: Secondary | ICD-10-CM | POA: Diagnosis not present

## 2022-11-13 DIAGNOSIS — D2271 Melanocytic nevi of right lower limb, including hip: Secondary | ICD-10-CM | POA: Diagnosis not present

## 2022-11-13 DIAGNOSIS — D485 Neoplasm of uncertain behavior of skin: Secondary | ICD-10-CM | POA: Diagnosis not present

## 2022-11-14 DIAGNOSIS — F4323 Adjustment disorder with mixed anxiety and depressed mood: Secondary | ICD-10-CM | POA: Diagnosis not present

## 2022-11-19 ENCOUNTER — Other Ambulatory Visit: Payer: Self-pay | Admitting: Oncology

## 2022-11-19 DIAGNOSIS — Z006 Encounter for examination for normal comparison and control in clinical research program: Secondary | ICD-10-CM

## 2022-12-10 ENCOUNTER — Other Ambulatory Visit (HOSPITAL_BASED_OUTPATIENT_CLINIC_OR_DEPARTMENT_OTHER): Payer: Self-pay

## 2022-12-19 ENCOUNTER — Other Ambulatory Visit: Payer: Self-pay

## 2023-01-03 ENCOUNTER — Ambulatory Visit: Payer: 59 | Admitting: Gastroenterology

## 2023-01-11 ENCOUNTER — Telehealth: Payer: 59 | Admitting: Family Medicine

## 2023-01-11 DIAGNOSIS — N3000 Acute cystitis without hematuria: Secondary | ICD-10-CM | POA: Diagnosis not present

## 2023-01-11 MED ORDER — CEPHALEXIN 500 MG PO CAPS: 500.0000 mg | ORAL_CAPSULE | Freq: Two times a day (BID) | ORAL | 0 refills | Status: AC

## 2023-01-11 NOTE — Progress Notes (Signed)

## 2023-01-16 ENCOUNTER — Ambulatory Visit (INDEPENDENT_AMBULATORY_CARE_PROVIDER_SITE_OTHER): Payer: 59

## 2023-01-16 DIAGNOSIS — Z23 Encounter for immunization: Secondary | ICD-10-CM

## 2023-02-04 ENCOUNTER — Ambulatory Visit: Payer: 59 | Admitting: Physician Assistant

## 2023-02-21 ENCOUNTER — Other Ambulatory Visit (HOSPITAL_BASED_OUTPATIENT_CLINIC_OR_DEPARTMENT_OTHER): Payer: Self-pay

## 2023-02-21 ENCOUNTER — Encounter: Payer: Self-pay | Admitting: Family Medicine

## 2023-02-21 ENCOUNTER — Ambulatory Visit (INDEPENDENT_AMBULATORY_CARE_PROVIDER_SITE_OTHER): Payer: 59 | Admitting: Family Medicine

## 2023-02-21 VITALS — BP 118/64 | HR 73 | Temp 98.0°F | Resp 16 | Ht 62.0 in | Wt 143.0 lb

## 2023-02-21 DIAGNOSIS — F410 Panic disorder [episodic paroxysmal anxiety] without agoraphobia: Secondary | ICD-10-CM | POA: Diagnosis not present

## 2023-02-21 DIAGNOSIS — F411 Generalized anxiety disorder: Secondary | ICD-10-CM

## 2023-02-21 DIAGNOSIS — R7989 Other specified abnormal findings of blood chemistry: Secondary | ICD-10-CM

## 2023-02-21 DIAGNOSIS — R091 Pleurisy: Secondary | ICD-10-CM

## 2023-02-21 DIAGNOSIS — E663 Overweight: Secondary | ICD-10-CM

## 2023-02-21 LAB — VITAMIN D 25 HYDROXY (VIT D DEFICIENCY, FRACTURES): VITD: 23.91 ng/mL — ABNORMAL LOW (ref 30.00–100.00)

## 2023-02-21 MED ORDER — ESCITALOPRAM OXALATE 10 MG PO TABS
5.0000 mg | ORAL_TABLET | Freq: Every day | ORAL | Status: DC
Start: 2023-02-21 — End: 2023-05-21

## 2023-02-21 MED ORDER — ALPRAZOLAM 0.5 MG PO TABS
0.2500 mg | ORAL_TABLET | Freq: Every day | ORAL | 2 refills | Status: AC | PRN
Start: 2023-02-21 — End: 2023-08-20
  Filled 2023-02-21: qty 30, 30d supply, fill #0
  Filled 2023-05-17: qty 30, 30d supply, fill #1

## 2023-02-21 MED ORDER — TOPIRAMATE 25 MG PO TABS
ORAL_TABLET | ORAL | 0 refills | Status: DC
Start: 2023-02-21 — End: 2023-04-04
  Filled 2023-02-21 (×2): qty 120, 30d supply, fill #0

## 2023-02-21 MED ORDER — BUPROPION HCL ER (XL) 150 MG PO TB24
150.0000 mg | ORAL_TABLET | Freq: Every day | ORAL | 3 refills | Status: AC
Start: 2023-02-21 — End: ?
  Filled 2023-02-21: qty 30, 30d supply, fill #0
  Filled 2023-03-20 – 2023-03-21 (×2): qty 30, 30d supply, fill #1
  Filled 2023-04-15: qty 30, 30d supply, fill #2
  Filled 2023-05-17: qty 30, 30d supply, fill #3

## 2023-02-21 NOTE — Patient Instructions (Signed)
Aim to do some physical exertion for 150 minutes per week. This is typically divided into 5 days per week, 30 minutes per day. The activity should be enough to get your heart rate up. Anything is better than nothing if you have time constraints.  Please consider counseling. Contact [phone] to schedule an appointment or inquire about cost/insurance coverage.  [facility] located at [address], [place], [place].  Phone number = [phone].  Dr. Onoriode Edeh - Adult Psychiatry.    [facility] located at [address], [place], [place]. Phone number = [phone].   [facility] located at [address], [place], [place].  Phone number = [phone].   [facility] located at [address], , [place].  Phone number = [phone].  Let us know if you need anything.  

## 2023-02-21 NOTE — Progress Notes (Signed)
Chief Complaint  Patient presents with   Medication Problem    Discuss medication      Subjective Susan Montoya presents for f/u anxiety.  Pt is currently being treated with Lexapro 10 mg/d.  Reports OK since treatment. Feels she is gaining weight on it, wishes to switch to different med.  No thoughts of harming self or others. No self-medication with alcohol, prescription drugs or illicit drugs. Pt is not following with a counselor/psychologist.  Several weeks of posterior right chest pain.  She was sick for around a week.  She had a cough for over 4 days.  She woke up overnight and had quite a bit of pain.  It has since resolved.  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  She is not having a cough or shortness of breath.  No rashes, redness, swelling.  Past Medical History:  Diagnosis Date   Abnormal uterine bleeding (AUB)    Beta thalassemia minor    02-17-2019  per pt dx as child at age 18, was tested due to family history   Crohn's disease (HCC) GI--- dr Loreta Ave   no current med.----   s/p  colon resection 07/ 2007   Endometrial mass    GAD (generalized anxiety disorder)    HSV (herpes simplex virus) anogenital infection    HSV 2  (02-17-2019  per pt currently no flare)   TMJ syndrome    right side  (02-17-2019  per pt has had not issues in awhile)   Wears glasses    Allergies as of 02/21/2023   No Known Allergies      Medication List        Accurate as of February 21, 2023  1:59 PM. If you have any questions, ask your nurse or doctor.          STOP taking these medications    Creon 3000-9500 units Cpep Generic drug: Pancrelipase (Lip-Prot-Amyl) Stopped by: Sharlene Dory   oxyCODONE 5 MG immediate release tablet Commonly known as: Oxy IR/ROXICODONE Stopped by: Jilda Roche Ruthe Roemer   Wegovy 1.7 MG/0.75ML Soaj Generic drug: Semaglutide-Weight Management Stopped by: Jilda Roche Ladislaus Repsher    Wegovy 2.4 MG/0.75ML Soaj Generic drug: Semaglutide-Weight Management Stopped by: Sharlene Dory       TAKE these medications    ALPRAZolam 0.5 MG tablet Commonly known as: XANAX Take 0.5-1 tablets (0.25-0.5 mg total) by mouth daily as needed.   buPROPion 150 MG 24 hr tablet Commonly known as: Wellbutrin XL Take 1 tablet (150 mg total) by mouth daily. Started by: Sharlene Dory   escitalopram 10 MG tablet Commonly known as: Lexapro Take 0.5 tablets (5 mg total) by mouth daily. What changed: how much to take Changed by: Sharlene Dory   [name] IU by Intrauterine route.   Nurtec 75 MG Tbdp Generic drug: Rimegepant Sulfate Take 1 tablet by mouth daily as needed for migraines.   PROBIOTIC PO Take 1 tablet by mouth once a week.   topiramate 25 MG tablet Commonly known as: TOPAMAX Take 1 tablet by mouth once a day for 3 days then 1 tab 2 times a day for 3 days. Add 1 tablet in AM and then PM over 3 day intervals until taking 2 tablets twice a day. Started by: Sharlene Dory   valACYclovir 500 MG tablet Commonly known as: VALTREX Take 1 tablet (500 mg total) by mouth 2 (two) times daily for 3  days for recurrences        Exam BP 118/64 (BP Location: Left Arm, Patient Position: Sitting, Cuff Size: Normal)   Pulse 73   Temp 98 F (36.7 C) (Oral)   Resp 16   Ht 5\' 2"  (1.575 m)   Wt 143 lb (64.9 kg)   SpO2 100%   BMI 26.16 kg/m  General:  well developed, well nourished, in no apparent distress MSK: No ttp over chest wall or midline thoracic Heart: RRR Lungs:  CTAB. No respiratory distress Psych: well oriented with normal range of affect and age-appropriate judgement/insight, alert and oriented x4.  Assessment and Plan  GAD (generalized anxiety disorder) - Plan: escitalopram (LEXAPRO) 10 MG tablet, buPROPion (WELLBUTRIN XL) 150 MG 24 hr tablet  Panic attacks - Plan: ALPRAZolam (XANAX) 0.5 MG tablet  Overweight (BMI 25.0-29.9)  - Plan: topiramate (TOPAMAX) 25 MG tablet  Low vitamin D level - Plan: VITAMIN D 25 Hydroxy (Vit-D Deficiency, Fractures)  Pleurisy - Plan: DG Chest 2 View  Chronic, adverse effect of med. Stop Lexapro. Start Wellbutrin XL 150 mg/d. Counseling info provided. Counseled on exercise as adjunctive tx.  She is struggling with weight, we will add topiramate to her Wellbutrin.  Follow-up in 6 weeks.  Counseled on diet and exercise. Check vitamin D. Patient describes some pleuritic pain is improved.  She has no risk factors for a PE.  Will check a chest x-ray for reassurance. The patient voiced understanding and agreement to the plan.  Jilda Roche [place], DO 02/21/23 1:59 PM

## 2023-02-22 ENCOUNTER — Other Ambulatory Visit: Payer: Self-pay | Admitting: Family Medicine

## 2023-02-22 MED ORDER — VITAMIN D (ERGOCALCIFEROL) 1.25 MG (50000 UNIT) PO CAPS
50000.0000 [IU] | ORAL_CAPSULE | ORAL | 0 refills | Status: DC
  Filled 2023-02-22: qty 12, 84d supply, fill #0

## 2023-02-24 ENCOUNTER — Other Ambulatory Visit (HOSPITAL_BASED_OUTPATIENT_CLINIC_OR_DEPARTMENT_OTHER): Payer: Self-pay

## 2023-02-26 ENCOUNTER — Other Ambulatory Visit (HOSPITAL_BASED_OUTPATIENT_CLINIC_OR_DEPARTMENT_OTHER): Payer: Self-pay

## 2023-03-14 ENCOUNTER — Ambulatory Visit: Payer: 59 | Admitting: Gastroenterology

## 2023-03-20 ENCOUNTER — Other Ambulatory Visit: Payer: Self-pay

## 2023-03-21 ENCOUNTER — Other Ambulatory Visit (HOSPITAL_COMMUNITY)
Admission: RE | Admit: 2023-03-21 | Discharge: 2023-03-21 | Disposition: A | Discharge status: Home / Self Care | Payer: 59 | Source: Ambulatory Visit | Attending: Oncology | Admitting: Oncology

## 2023-03-21 ENCOUNTER — Other Ambulatory Visit: Payer: Self-pay

## 2023-03-21 ENCOUNTER — Other Ambulatory Visit (HOSPITAL_COMMUNITY): Payer: Self-pay

## 2023-03-21 DIAGNOSIS — Z006 Encounter for examination for normal comparison and control in clinical research program: Secondary | ICD-10-CM | POA: Insufficient documentation

## 2023-04-01 ENCOUNTER — Other Ambulatory Visit (HOSPITAL_COMMUNITY): Payer: Self-pay

## 2023-04-01 ENCOUNTER — Other Ambulatory Visit: Payer: Self-pay | Admitting: Family Medicine

## 2023-04-01 LAB — GENECONNECT MOLECULAR SCREEN: Genetic Analysis Overall Interpretation: NEGATIVE

## 2023-04-01 MED ORDER — NURTEC 75 MG PO TBDP
1.0000 | ORAL_TABLET | Freq: Every day | ORAL | 0 refills | Status: DC | PRN
  Filled 2023-04-01: qty 16, 30d supply, fill #0

## 2023-04-04 ENCOUNTER — Encounter: Payer: Self-pay | Admitting: Family Medicine

## 2023-04-04 ENCOUNTER — Ambulatory Visit (INDEPENDENT_AMBULATORY_CARE_PROVIDER_SITE_OTHER): Payer: 59 | Admitting: Family Medicine

## 2023-04-04 VITALS — BP 122/68 | HR 75 | Temp 97.7°F | Resp 16 | Ht 62.0 in | Wt 143.8 lb

## 2023-04-04 DIAGNOSIS — B070 Plantar wart: Secondary | ICD-10-CM

## 2023-04-04 NOTE — Addendum Note (Signed)
Addended by: Kathi Ludwig on: 04/04/2023 03:04 PM   Modules accepted: Orders

## 2023-04-04 NOTE — Progress Notes (Addendum)
Chief Complaint  Patient presents with   Warts    Wart removal     Susan Montoya is a 47 y.o. female here for a skin complaint.  Duration: 2 yrs, getting worse Location: bottom of L ball of foot Pruritic? No Painful? Yes Drainage? No New soaps/lotions/topicals/detergents? No Other associated symptoms: hurting more recently, some bleeding when she pares it down Therapies tried thus far: Compound W, various OTC options  Past Medical History:  Diagnosis Date   Abnormal uterine bleeding (AUB)    Beta thalassemia minor    02-17-2019  per pt dx as child at age 52, was tested due to family history   Crohn's disease (HCC) GI--- dr Loreta Ave   no current med.----   s/p  colon resection 07/ 2007   Endometrial mass    GAD (generalized anxiety disorder)    HSV (herpes simplex virus) anogenital infection    HSV 2  (02-17-2019  per pt currently no flare)   TMJ syndrome    right side  (02-17-2019  per pt has had not issues in awhile)   Wears glasses     BP 122/68 (BP Location: Left Arm, Patient Position: Sitting, Cuff Size: Normal)   Pulse 75   Temp 97.7 F (36.5 C) (Oral)   Resp 16   Ht 5\' 2"  (1.575 m)   Wt 143 lb 12.8 oz (65.2 kg)   SpO2 98%   BMI 26.30 kg/m  Gen: awake, alert, appearing stated age Lungs: No accessory muscle use Skin: thickened skin along distal R forefoot at base of 3/4th digits. Punctate petechiae noted within. +ttp. No drainage, erythema, fluctuance, excoriation Psych: Age appropriate judgment and insight  Procedure note: cryotherapy Verbal consent obtained 1 skin lesion treated Liquid nitrogen was applied via a thin spray creating an ice ball with 1-2 mm corona surrounding the lesion The patient tolerated the procedure well There were no immediate complications noted  Plantar wart of right foot - Plan: PR DESTRUCTION BENIGN LESIONS UP TO 14  Cryo today. She knows it will likely not fix issue by itself. Could repeat tx in future. Rec'd ASA paste.  F/u  prn. The patient voiced understanding and agreement to the plan.  Jilda Roche [place], DO 04/04/23 2:43 PM

## 2023-04-04 NOTE — Patient Instructions (Addendum)
Take some aspirin and ground it up. Mix some water with it and make a paste. Apply to the area daily for 10-15 min for 2 weeks.   Let us know if you need anything.

## 2023-04-18 NOTE — Progress Notes (Signed)
Order(s) created erroneously. Erroneous order ID: [id number]  Order moved by: CHART CORRECTION ANALYST, FIFTEEN  Order move date/time: 04/18/2023 10:48 AM  Source Patient: [id number]  Source Contact: 04/04/2023  Destination Patient: [id number]  Destination Contact: 07/14/2012  Erroneous order ID: [id number]  Order moved by: CHART CORRECTION ANALYST, FIFTEEN  Order move date/time: 04/18/2023 10:48 AM  Source Patient: [id number]  Source Contact: 04/04/2023  Destination Patient: [id number]  Destination Contact: 07/14/2012

## 2023-04-25 ENCOUNTER — Encounter: Payer: Self-pay | Admitting: Family Medicine

## 2023-05-19 ENCOUNTER — Other Ambulatory Visit (HOSPITAL_BASED_OUTPATIENT_CLINIC_OR_DEPARTMENT_OTHER): Payer: Self-pay

## 2023-05-19 ENCOUNTER — Other Ambulatory Visit: Payer: Self-pay

## 2023-05-21 ENCOUNTER — Encounter: Payer: Self-pay | Admitting: Family Medicine

## 2023-05-21 ENCOUNTER — Other Ambulatory Visit (HOSPITAL_BASED_OUTPATIENT_CLINIC_OR_DEPARTMENT_OTHER): Payer: Self-pay

## 2023-05-21 ENCOUNTER — Ambulatory Visit: Payer: 59 | Admitting: Family Medicine

## 2023-05-21 ENCOUNTER — Ambulatory Visit (INDEPENDENT_AMBULATORY_CARE_PROVIDER_SITE_OTHER): Payer: 59 | Admitting: Family Medicine

## 2023-05-21 VITALS — BP 116/66 | HR 86 | Temp 98.0°F | Resp 16 | Ht 62.0 in | Wt 144.0 lb

## 2023-05-21 DIAGNOSIS — F321 Major depressive disorder, single episode, moderate: Secondary | ICD-10-CM | POA: Insufficient documentation

## 2023-05-21 DIAGNOSIS — T7840XD Allergy, unspecified, subsequent encounter: Secondary | ICD-10-CM

## 2023-05-21 DIAGNOSIS — F411 Generalized anxiety disorder: Secondary | ICD-10-CM

## 2023-05-21 MED ORDER — CARIPRAZINE HCL 1.5 MG PO CAPS
1.5000 mg | ORAL_CAPSULE | Freq: Every day | ORAL | 1 refills | Status: DC
  Filled 2023-05-21: qty 30, 30d supply, fill #0
  Filled 2023-08-27: qty 30, 30d supply, fill #1

## 2023-05-21 MED ORDER — EPINEPHRINE 0.3 MG/0.3ML IJ SOAJ
0.3000 mg | INTRAMUSCULAR | 2 refills | Status: AC | PRN
  Filled 2023-05-21: qty 2, 2d supply, fill #0

## 2023-05-21 MED ORDER — BUPROPION HCL ER (XL) 150 MG PO TB24
150.0000 mg | ORAL_TABLET | Freq: Every day | ORAL | 3 refills | Status: AC
  Filled 2023-05-21 – 2023-06-15 (×2): qty 90, 90d supply, fill #0
  Filled 2023-11-16 – 2023-11-21 (×2): qty 90, 90d supply, fill #1

## 2023-05-21 NOTE — Progress Notes (Signed)
Chief Complaint  Patient presents with   Medication Management    Medication management    Subjective Susan Montoya presents for f/u anxiety/depression.  Pt is currently being treated with Wellbutrin XL 150 mg/d, Xanax.  Reports struggling with depressive symptoms since treatment.  She has been tearful, decreased interest in doing things, fatigue, and depressed mood. No thoughts of harming self or others. No self-medication with alcohol, prescription drugs or illicit drugs. Pt is following with a counselor/psychologist.  She has a history of allergies and would like a refill of her EpiPen.  Her current pen is 48 years old.  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 If you have any questions, ask your nurse or doctor.          STOP taking these medications    escitalopram 10 MG tablet Commonly known as: Lexapro Stopped by: Sharlene Dory       TAKE these medications    ALPRAZolam 0.5 MG tablet Commonly known as: XANAX Take 0.5-1 tablets (0.25-0.5 mg total) by mouth daily as needed.   buPROPion 150 MG 24 hr tablet Commonly known as: Wellbutrin XL Take 1 tablet (150 mg total) by mouth daily.   cariprazine 1.5 MG capsule Commonly known as: Vraylar Take 1 capsule (1.5 mg total) by mouth daily. Started by: Sharlene Dory   EPINEPHrine 0.3 mg/0.3 mL  Soaj injection Commonly known as: EpiPen 2-Pak Inject 0.3 mg into the muscle as needed for anaphylaxis. Started by: Jilda Roche Taleeyah Bora   [name] IU by Intrauterine route.   Nurtec 75 MG Tbdp Generic drug: Rimegepant Sulfate Take 1 tablet (75 mg total) by mouth daily as needed (migraines).   PROBIOTIC PO Take 1 tablet by mouth once a week.   valACYclovir 500 MG tablet Commonly known as: VALTREX Take 1 tablet (500 mg total) by mouth 2 (two) times daily for 3 days for recurrences   Vitamin D (Ergocalciferol) 1.25 MG (50000 UNIT) Caps capsule Commonly known as: DRISDOL Take 1 capsule (50,000 Units total) by mouth every 7 (seven) days.        Exam BP 116/66   Pulse 86   Temp 98 F (36.7 C) (Oral)   Resp 16   Ht 5\' 2"  (1.575 m)   Wt 144 lb (65.3 kg)   SpO2 96%   BMI 26.34 kg/m  General:  well developed, well nourished, in no apparent distress Lungs:  No respiratory distress Psych: well oriented with normal range of affect and age-appropriate judgement/insight, alert and oriented x4.  Assessment an[d Plan  Allergy, subsequent encounter - Plan: EPINEPHrine (EPIPEN 2-PAK) 0.3 mg/0.3 mL IJ SOAJ injection  GAD (generalized anxiety disorder)  Depression, major, single episode, moderate (HCC) - Plan: buPROPion (WELLBUTRIN XL) 150 MG 24 hr tablet, cariprazine (VRAYLAR) 1.5 MG capsule  Refill EpiPen. 2/3.  Chronic, not stable.  Wellbutrin XL 150 mg/d, Xanax prn, start Vraylar 1.5 mg/d for depressive s/s's.  Continue with counseling team.  She will let me know if she wishes to increase the dosage of the Vraylar. F/u in 6 mo. The patient voiced understanding and agreement to the plan.  Jilda Roche [place], DO 05/21/23 2:28 PM

## 2023-05-21 NOTE — Patient Instructions (Addendum)
Keep the diet clean and stay active.  Let me know if you want to increase the Vraylar.   Let us know if you need anything.

## 2023-05-23 ENCOUNTER — Ambulatory Visit: Payer: 59 | Admitting: Family Medicine

## 2023-06-15 ENCOUNTER — Other Ambulatory Visit (HOSPITAL_COMMUNITY): Payer: Self-pay

## 2023-06-16 ENCOUNTER — Other Ambulatory Visit: Payer: Self-pay

## 2023-06-27 ENCOUNTER — Encounter: Payer: Self-pay | Admitting: Family Medicine

## 2023-06-27 DIAGNOSIS — Z1231 Encounter for screening mammogram for malignant neoplasm of breast: Secondary | ICD-10-CM | POA: Diagnosis not present

## 2023-06-27 LAB — HM MAMMOGRAPHY

## 2023-06-30 ENCOUNTER — Encounter: Payer: Self-pay | Admitting: Family Medicine

## 2023-07-09 ENCOUNTER — Other Ambulatory Visit: Payer: Self-pay

## 2023-07-11 ENCOUNTER — Ambulatory Visit: Payer: Self-pay | Admitting: Podiatry

## 2023-08-29 ENCOUNTER — Other Ambulatory Visit (HOSPITAL_BASED_OUTPATIENT_CLINIC_OR_DEPARTMENT_OTHER): Payer: Self-pay

## 2023-08-29 ENCOUNTER — Other Ambulatory Visit: Payer: Self-pay | Admitting: Physician Assistant

## 2023-08-29 DIAGNOSIS — J020 Streptococcal pharyngitis: Secondary | ICD-10-CM

## 2023-08-29 MED ORDER — AMOXICILLIN 875 MG PO TABS
875.0000 mg | ORAL_TABLET | Freq: Two times a day (BID) | ORAL | 0 refills | Status: AC
  Filled 2023-08-29: qty 20, 10d supply, fill #0

## 2023-11-03 ENCOUNTER — Telehealth: Payer: Self-pay

## 2023-11-03 ENCOUNTER — Other Ambulatory Visit (HOSPITAL_COMMUNITY): Payer: Self-pay

## 2023-11-03 NOTE — Telephone Encounter (Signed)
 Pharmacy Patient Advocate Encounter   Received notification from CoverMyMeds that prior authorization for Nurtec 75 is required/requested.   Insurance verification completed.   The patient is insured through [facility] .   Per test claim: The current 30 day co-pay is, $19.97.  No PA needed at this time. This test claim was processed through [facility]- copay amounts may vary at other pharmacies due to pharmacy/plan contracts, or as the patient moves through the different stages of their insurance plan.

## 2023-11-11 DIAGNOSIS — Z30431 Encounter for routine checking of intrauterine contraceptive device: Secondary | ICD-10-CM | POA: Diagnosis not present

## 2023-11-11 DIAGNOSIS — H5053 Vertical heterophoria: Secondary | ICD-10-CM | POA: Diagnosis not present

## 2023-11-11 DIAGNOSIS — F419 Anxiety disorder, unspecified: Secondary | ICD-10-CM | POA: Diagnosis not present

## 2023-11-11 DIAGNOSIS — H524 Presbyopia: Secondary | ICD-10-CM | POA: Diagnosis not present

## 2023-11-11 DIAGNOSIS — K5090 Crohn's disease, unspecified, without complications: Secondary | ICD-10-CM | POA: Diagnosis not present

## 2023-11-11 DIAGNOSIS — Z135 Encounter for screening for eye and ear disorders: Secondary | ICD-10-CM | POA: Diagnosis not present

## 2023-11-11 DIAGNOSIS — Z01419 Encounter for gynecological examination (general) (routine) without abnormal findings: Secondary | ICD-10-CM | POA: Diagnosis not present

## 2023-11-16 ENCOUNTER — Other Ambulatory Visit: Payer: Self-pay | Admitting: Family Medicine

## 2023-11-16 DIAGNOSIS — F321 Major depressive disorder, single episode, moderate: Secondary | ICD-10-CM

## 2023-11-17 ENCOUNTER — Other Ambulatory Visit: Payer: Self-pay

## 2023-11-17 ENCOUNTER — Other Ambulatory Visit (HOSPITAL_COMMUNITY): Payer: Self-pay

## 2023-11-17 MED ORDER — CARIPRAZINE HCL 1.5 MG PO CAPS
1.5000 mg | ORAL_CAPSULE | Freq: Every day | ORAL | 1 refills | Status: AC
  Filled 2023-11-17 – 2023-11-21 (×2): qty 30, 30d supply, fill #0
  Filled 2024-01-17: qty 30, 30d supply, fill #1

## 2023-11-21 ENCOUNTER — Other Ambulatory Visit (HOSPITAL_BASED_OUTPATIENT_CLINIC_OR_DEPARTMENT_OTHER): Payer: Self-pay

## 2023-11-21 ENCOUNTER — Encounter: Payer: Self-pay | Admitting: Family Medicine

## 2023-11-21 ENCOUNTER — Ambulatory Visit: Payer: 59 | Admitting: Family Medicine

## 2023-11-21 ENCOUNTER — Ambulatory Visit: Payer: Self-pay | Admitting: Family Medicine

## 2023-11-21 ENCOUNTER — Other Ambulatory Visit (HOSPITAL_COMMUNITY): Payer: Self-pay

## 2023-11-21 VITALS — BP 124/72 | HR 69 | Temp 97.8°F | Resp 16 | Ht 62.0 in | Wt 132.0 lb

## 2023-11-21 DIAGNOSIS — Z0000 Encounter for general adult medical examination without abnormal findings: Secondary | ICD-10-CM

## 2023-11-21 DIAGNOSIS — R7989 Other specified abnormal findings of blood chemistry: Secondary | ICD-10-CM

## 2023-11-21 LAB — CBC
HCT: 34.5 % — ABNORMAL LOW (ref 36.0–46.0)
Hemoglobin: 10.8 g/dL — ABNORMAL LOW (ref 12.0–15.0)
MCHC: 31.3 g/dL (ref 30.0–36.0)
MCV: 62.3 fl — ABNORMAL LOW (ref 78.0–100.0)
Platelets: 292 K/uL (ref 150.0–400.0)
RBC: 5.54 Mil/uL — ABNORMAL HIGH (ref 3.87–5.11)
RDW: 15.5 % (ref 11.5–15.5)
WBC: 7.7 K/uL (ref 4.0–10.5)

## 2023-11-21 LAB — COMPREHENSIVE METABOLIC PANEL WITH GFR
ALT: 8 U/L (ref 0–35)
AST: 13 U/L (ref 0–37)
Albumin: 4.9 g/dL (ref 3.5–5.2)
Alkaline Phosphatase: 54 U/L (ref 39–117)
BUN: 11 mg/dL (ref 6–23)
CO2: 28 meq/L (ref 19–32)
Calcium: 9.9 mg/dL (ref 8.4–10.5)
Chloride: 105 meq/L (ref 96–112)
Creatinine, Ser: 0.88 mg/dL (ref 0.40–1.20)
GFR: 77.7 mL/min (ref >60.00)
Glucose, Bld: 84 mg/dL (ref 70–99)
Potassium: 4.4 meq/L (ref 3.5–5.1)
Sodium: 141 meq/L (ref 135–145)
Total Bilirubin: 1 mg/dL (ref 0.2–1.2)
Total Protein: 7.5 g/dL (ref 6.0–8.3)

## 2023-11-21 LAB — LIPID PANEL
Cholesterol: 135 mg/dL (ref 0–200)
HDL: 82.7 mg/dL (ref >39.00)
LDL Cholesterol: 44 mg/dL (ref 0–99)
NonHDL: 52.23
Total CHOL/HDL Ratio: 2
Triglycerides: 43 mg/dL (ref 0.0–149.0)
VLDL: 8.6 mg/dL (ref 0.0–40.0)

## 2023-11-21 LAB — VITAMIN D 25 HYDROXY (VIT D DEFICIENCY, FRACTURES): VITD: 38.18 ng/mL (ref 30.00–100.00)

## 2023-11-21 MED ORDER — SCOPOLAMINE 1 MG/3DAYS TD PT72
1.0000 | MEDICATED_PATCH | TRANSDERMAL | 0 refills | Status: AC
  Filled 2023-11-21: qty 4, 12d supply, fill #0

## 2023-11-21 NOTE — Patient Instructions (Signed)
Give [place] 2-3 business days to get the results of your labs back.   Keep the diet clean and stay active.  Aim to do some physical exertion for 150 minutes per week. This is typically divided into 5 days per week, 30 minutes per day. The activity should be enough to get your heart rate up. Anything is better than nothing if you have time constraints.  Please consider adding some weight resistance exercise to your routine. Consider yoga as well.  Please get me a copy of your advanced directive form at your convenience.    Let us know if you need anything.

## 2023-11-21 NOTE — Progress Notes (Signed)
 Chief Complaint  Patient presents with   Annual Exam    CPE     Well Woman Susan Montoya is here for a complete physical.   Her last physical was >1 year ago.  Current diet: in general, a healthy diet. Current exercise: core workout, . Weight is stable and she denies fatigue out of ordinary. No LMP recorded. (Menstrual status: IUD). Seatbelt? Yes Advanced directive? No  Health Maintenance Pap/HPV- Yes Mammogram- Yes Tetanus- Yes Hep C screening- Yes HIV screening- Yes  Past Medical History:  Diagnosis Date   Abnormal uterine bleeding (AUB)    Beta thalassemia minor    02-17-2019  per pt dx as child at age 32, was tested due to family history   Crohn's disease (HCC) GI--- dr kristie   no current med.----   s/p  colon resection 07/ 2007   Endometrial mass    GAD (generalized anxiety disorder)    HSV (herpes simplex virus) anogenital infection    HSV 2  (02-17-2019  per pt currently no flare)   TMJ syndrome    right side  (02-17-2019  per pt has had not issues in awhile)   Wears glasses      Past Surgical History:  Procedure Laterality Date   COLON RESECTION  07/ 2007--- age 49   terminal ileum and right colon w/ appendix   COLONOSCOPY WITH PROPOFOL   last one 05-09-2010   DILATATION & CURETTAGE/HYSTEROSCOPY WITH MYOSURE N/A 02/18/2019   Procedure: DILATATION & CURETTAGE/HYSTEROSCOPY WITH MYOSURE;  Surgeon: Rutherford Gain, MD;  Location: [facility];  Service: Gynecology;  Laterality: N/A;   LAPAROSCOPIC TUBAL LIGATION Bilateral 11/07/2014   Procedure: LAPAROSCOPIC  BILATERAL TUBAL LIGATION with cautery and fulgeration of endmetrios;  Surgeon: Gain Rutherford, MD;  Location: [facility];  Service: Gynecology;  Laterality: Bilateral;   TONSILLECTOMY N/A 10/08/2013   Procedure: TONSILLECTOMY;  Surgeon: Lonni FORBES Angle, MD;  Location: [facility];  Service: ENT;  Laterality: N/A;   WISDOM TOOTH EXTRACTION      Medications  Current  Outpatient Medications on File Prior to Visit  Medication Sig Dispense Refill   buPROPion  (WELLBUTRIN  XL) 150 MG 24 hr tablet Take 1 tablet (150 mg total) by mouth daily. 90 tablet 3   cariprazine  (VRAYLAR ) 1.5 MG capsule Take 1 capsule (1.5 mg total) by mouth daily. 30 capsule 1   EPINEPHrine  (EPIPEN  2-PAK) 0.3 mg/0.3 mL IJ SOAJ injection Inject 0.3 mg into the muscle as needed for anaphylaxis. 2 each 2   Levonorgestrel ([name] IU) by Intrauterine route.     Probiotic Product (PROBIOTIC PO) Take 1 tablet by mouth once a week.      Rimegepant Sulfate  (NURTEC) 75 MG TBDP Take 1 tablet (75 mg total) by mouth daily as needed (migraines). 16 tablet 0   valACYclovir  (VALTREX ) 500 MG tablet Take 1 tablet (500 mg total) by mouth 2 (two) times daily for 3 days for recurrences 30 tablet 3    Allergies No Known Allergies  Review of Systems: Constitutional:  no unexpected weight changes Eye:  no recent significant change in vision Ear/Nose/Mouth/Throat:  Ears:  no recent change in hearing Nose/Mouth/Throat:  no complaints of nasal congestion, no sore throat Cardiovascular: no chest pain Respiratory:  no shortness of breath Gastrointestinal:  no abdominal pain, no change in bowel habits GU:  Female: negative for dysuria or pelvic pain Musculoskeletal/Extremities:  no pain of the joints Integumentary (Skin/Breast):  no abnormal skin lesions reported Neurologic:  no headaches  Endocrine:  denies fatigue Hematologic/Lymphatic:  No areas of easy bleeding  Exam BP 124/72 (BP Location: Left Arm, Patient Position: Sitting)   Pulse 69   Temp 97.8 F (36.6 C) (Oral)   Resp 16   Ht 5' 2 (1.575 m)   Wt 132 lb (59.9 kg)   SpO2 97%   BMI 24.14 kg/m  General:  well developed, well nourished, in no apparent distress Skin:  no significant moles, warts, or growths Head:  no masses, lesions, or tenderness Eyes:  pupils equal and round, sclera anicteric without injection Ears:  canals without lesions,  TMs shiny without retraction, no obvious effusion, no erythema Nose:  nares patent, mucosa normal, and no drainage Throat/Pharynx:  lips and gingiva without lesion; tongue and uvula midline; non-inflamed pharynx; no exudates or postnasal drainage Neck: neck supple without adenopathy, thyromegaly, or masses Lungs:  clear to auscultation, breath sounds equal bilaterally, no respiratory distress Cardio:  regular rate and rhythm, no LE edema Abdomen:  abdomen soft, nontender; bowel sounds normal; no masses or organomegaly Genital: Defer to GYN Musculoskeletal:  symmetrical muscle groups noted without atrophy or deformity Extremities:  no clubbing, cyanosis, or edema, no deformities, no skin discoloration Neuro:  gait normal; deep tendon reflexes normal and symmetric Psych: well oriented with normal range of affect and appropriate judgment/insight  Assessment and Plan  Well adult exam - Plan: CBC, Comprehensive metabolic panel with GFR, Lipid panel  Low vitamin D  level - Plan: VITAMIN D  25 Hydroxy (Vit-D Deficiency, Fractures)   Well 48 y.o. female. Counseled on diet and exercise. Advanced directive form requested today.  Other orders as above. Follow up in 1 yr. The patient voiced understanding and agreement to the plan.  Mabel Mt [place], DO 11/21/23 1:08 PM

## 2023-12-20 IMAGING — CT CT ABD-PELV W/ CM
2 of 5 series · 16 of 46 positions shown, 18 images · IV contrast (Omnipaque)
Comparison: None.

CLINICAL DATA: Left lower quadrant abdominal pain x1 month. History
of Crohn's disease.

EXAM:
CT ABDOMEN AND PELVIS WITH CONTRAST
TECHNIQUE: Multidetector CT imaging of the abdomen and pelvis was performed
using the standard protocol following bolus administration of
intravenous contrast.
CONTRAST:  100mL OMNIPAQUE IOHEXOL 300 MG/ML  SOLN

[Series 2: axial st · axial · 0.90mm/px · z∈[-437,-62]mm · 13 of 85 slices shown, 15 images]
[im 5/85  soft-tissue]
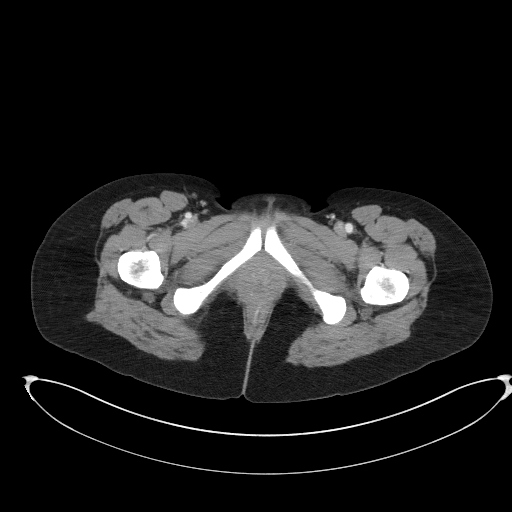
[im 5/85  bone]
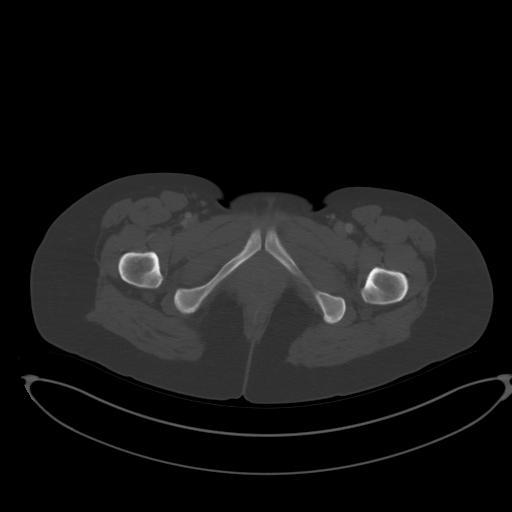
[im 10/85  soft-tissue]
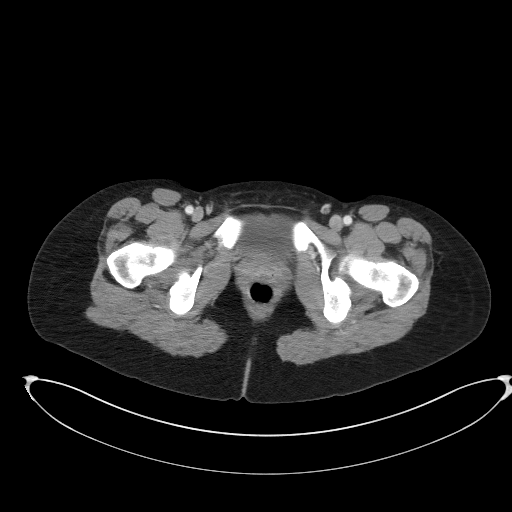
[im 19/85  soft-tissue]
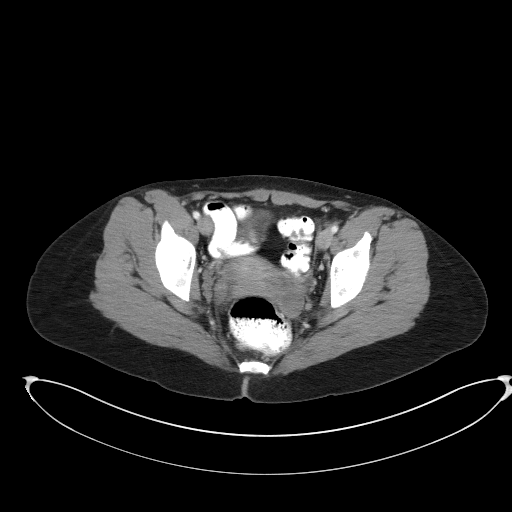
[im 24/85  soft-tissue]
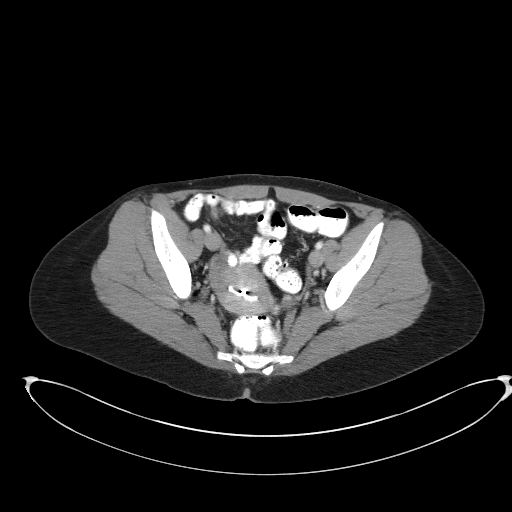
[im 29/85  soft-tissue]
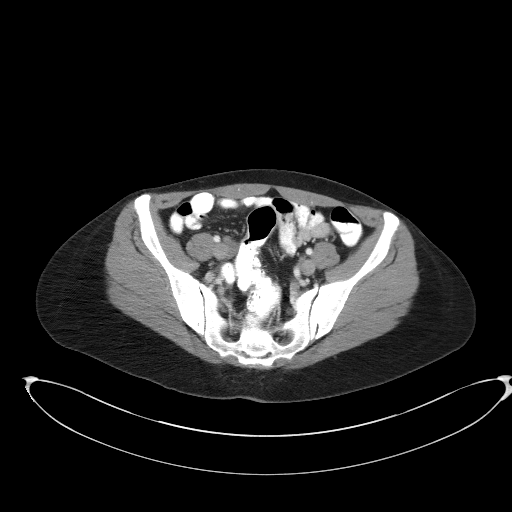
[im 38/85  soft-tissue]
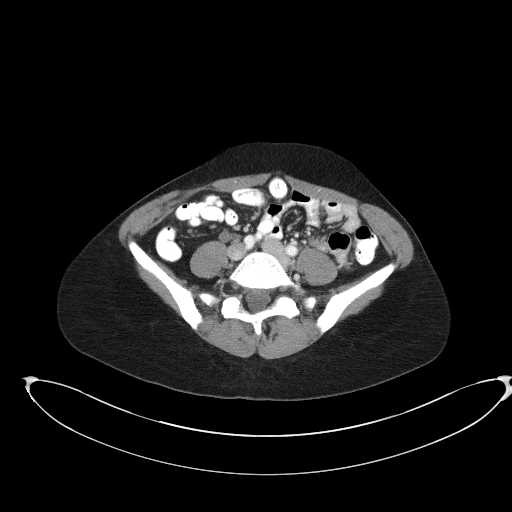
[im 43/85  soft-tissue]
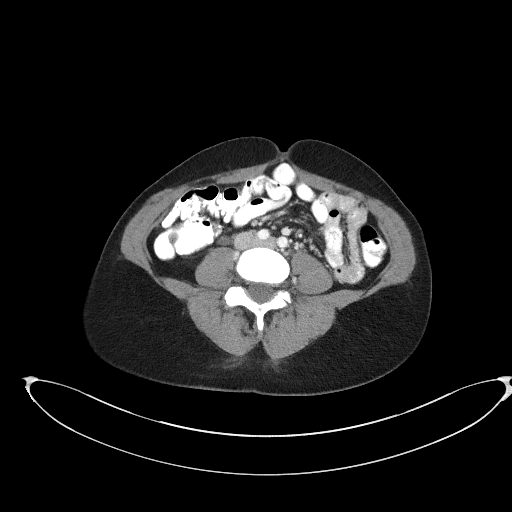
[im 47/85  soft-tissue]
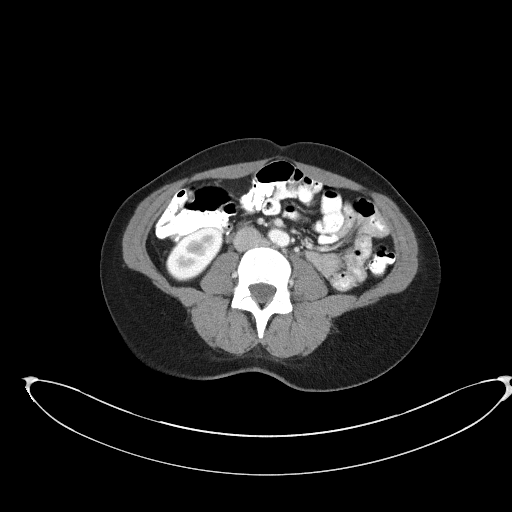
[im 57/85  soft-tissue]
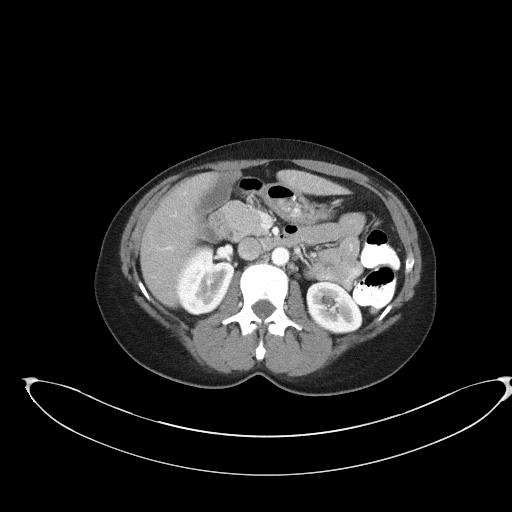
[im 57/85  bone]
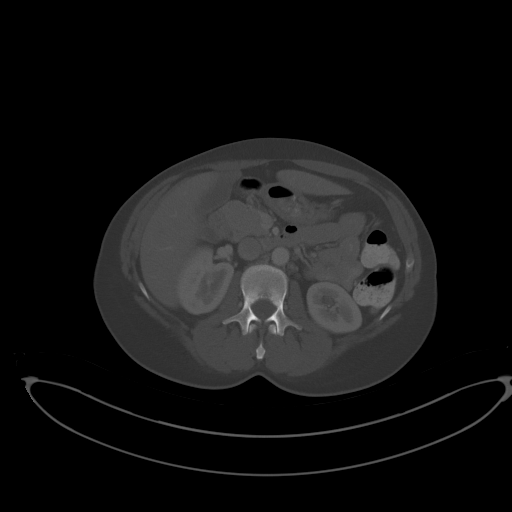
[im 61/85  soft-tissue]
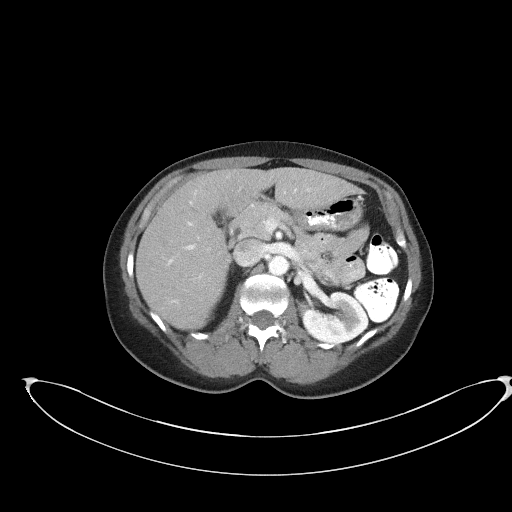
[im 66/85  soft-tissue]
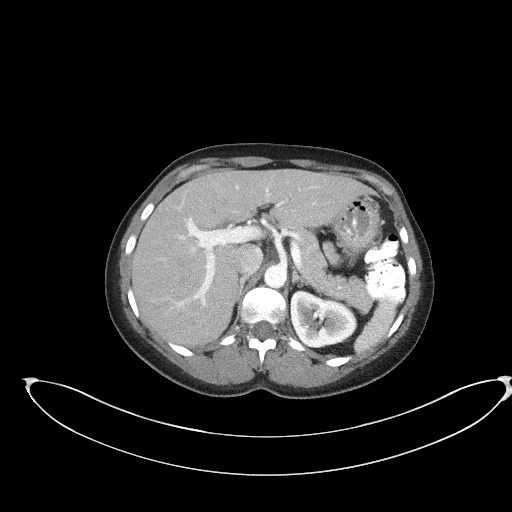
[im 75/85  soft-tissue]
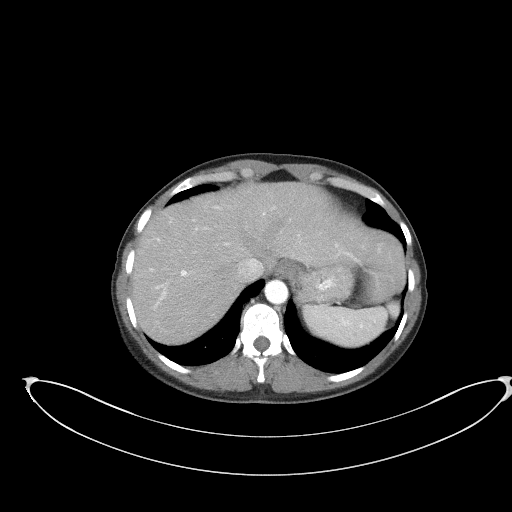
[im 80/85  soft-tissue]
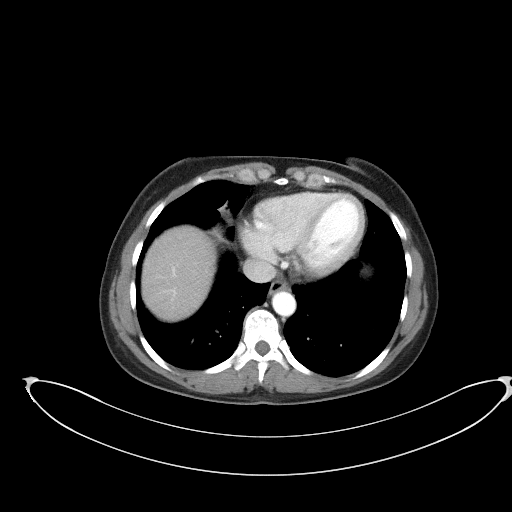

[Series 5: coronal st · coronal · 0.82mm/px · 3 of 80 slices shown]
[im 27/80  soft-tissue]
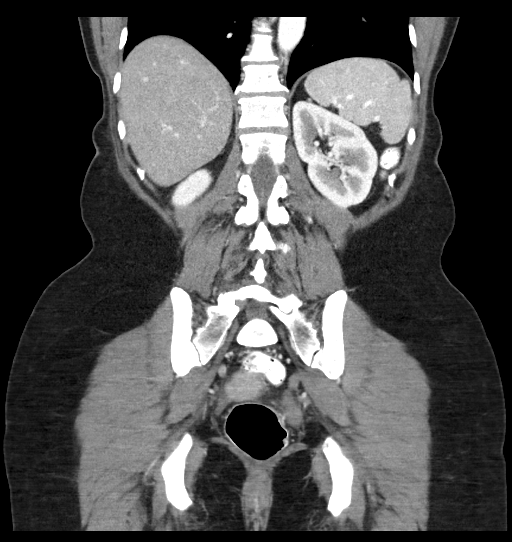
[im 36/80  soft-tissue]
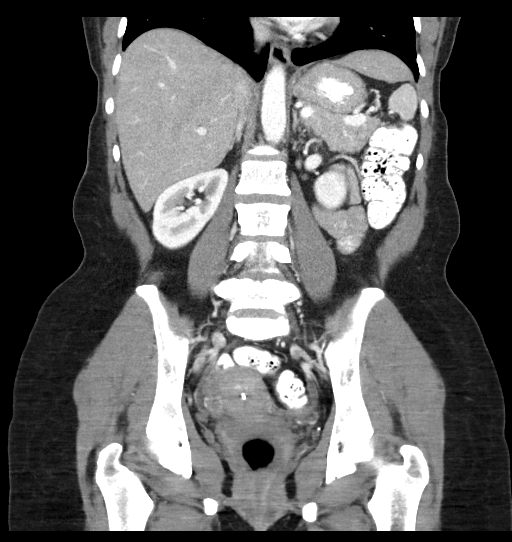
[im 44/80  soft-tissue]
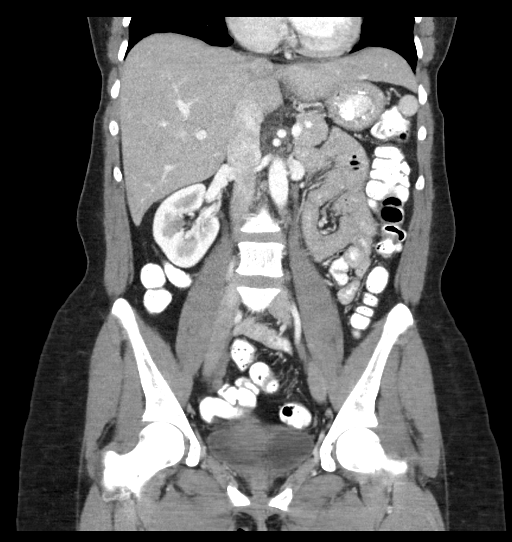

[16 of 46 positions shown; findings below may reference images not displayed]

FINDINGS: Lower chest: Right middle lobe scarring versus atelectasis.

Hepatobiliary: Mild diffuse hepatic steatosis. No suspicious hepatic
lesion. Gallbladder is unremarkable. No biliary ductal dilation.

Pancreas: No pancreatic ductal dilation or evidence of acute
inflammation.

Spleen: Normal in size without focal abnormality.

Adrenals/Urinary Tract: Bilateral adrenal glands are unremarkable.
No hydronephrosis. Punctate nonobstructive left lower pole renal
stones. No obstructive ureteral or bladder calculi identified. No
solid enhancing renal mass. Urinary bladder is unremarkable for
degree of distension.

Stomach/Bowel: Radiopaque enteric contrast material traverses the
rectum. Stomach is unremarkable for degree of distension. No
pathologic dilation of small or large bowel. The appendix and
terminal ileum appear normal. No evidence of acute bowel
inflammation. No suspicious colonic wall thickening or mass like
lesions identified.

Vascular/Lymphatic: No significant vascular finding. No
pathologically enlarged abdominal or pelvic lymph nodes.

Reproductive: Intrauterine device appears appropriate in
positioning. No suspicious adnexal mass.

Other: No significant abdominopelvic free fluid.

Musculoskeletal: No acute or significant osseous findings.
IMPRESSION: 1. No acute findings in the abdomen or pelvis.
2. Punctate nonobstructive left lower pole renal stones.
3. Mild diffuse hepatic steatosis.

## 2024-01-17 ENCOUNTER — Other Ambulatory Visit: Payer: Self-pay | Admitting: Family Medicine

## 2024-01-19 ENCOUNTER — Other Ambulatory Visit (HOSPITAL_BASED_OUTPATIENT_CLINIC_OR_DEPARTMENT_OTHER): Payer: Self-pay

## 2024-01-19 ENCOUNTER — Other Ambulatory Visit: Payer: Self-pay

## 2024-01-19 MED ORDER — NURTEC 75 MG PO TBDP
75.0000 mg | ORAL_TABLET | Freq: Every day | ORAL | 0 refills | Status: DC | PRN
  Filled 2024-01-19: qty 16, 30d supply, fill #0

## 2024-01-20 ENCOUNTER — Other Ambulatory Visit (HOSPITAL_BASED_OUTPATIENT_CLINIC_OR_DEPARTMENT_OTHER): Payer: Self-pay

## 2024-01-21 ENCOUNTER — Ambulatory Visit

## 2024-01-21 ENCOUNTER — Ambulatory Visit (INDEPENDENT_AMBULATORY_CARE_PROVIDER_SITE_OTHER)

## 2024-01-21 ENCOUNTER — Other Ambulatory Visit (HOSPITAL_BASED_OUTPATIENT_CLINIC_OR_DEPARTMENT_OTHER): Payer: Self-pay

## 2024-01-21 DIAGNOSIS — Z23 Encounter for immunization: Secondary | ICD-10-CM | POA: Diagnosis not present

## 2024-01-21 NOTE — Progress Notes (Signed)
 Patient is in office today for a nurse visit for Immunization. Patient Injection was given in the  Right deltoid. Patient tolerated injection well.

## 2024-01-22 ENCOUNTER — Other Ambulatory Visit (HOSPITAL_BASED_OUTPATIENT_CLINIC_OR_DEPARTMENT_OTHER): Payer: Self-pay

## 2024-01-23 ENCOUNTER — Other Ambulatory Visit (HOSPITAL_BASED_OUTPATIENT_CLINIC_OR_DEPARTMENT_OTHER): Payer: Self-pay

## 2024-01-26 ENCOUNTER — Other Ambulatory Visit (HOSPITAL_BASED_OUTPATIENT_CLINIC_OR_DEPARTMENT_OTHER): Payer: Self-pay

## 2024-01-27 ENCOUNTER — Other Ambulatory Visit (HOSPITAL_BASED_OUTPATIENT_CLINIC_OR_DEPARTMENT_OTHER): Payer: Self-pay

## 2024-01-28 ENCOUNTER — Other Ambulatory Visit (HOSPITAL_BASED_OUTPATIENT_CLINIC_OR_DEPARTMENT_OTHER): Payer: Self-pay

## 2024-01-29 ENCOUNTER — Other Ambulatory Visit (HOSPITAL_BASED_OUTPATIENT_CLINIC_OR_DEPARTMENT_OTHER): Payer: Self-pay

## 2024-01-30 ENCOUNTER — Other Ambulatory Visit (HOSPITAL_BASED_OUTPATIENT_CLINIC_OR_DEPARTMENT_OTHER): Payer: Self-pay

## 2024-02-01 ENCOUNTER — Other Ambulatory Visit (HOSPITAL_BASED_OUTPATIENT_CLINIC_OR_DEPARTMENT_OTHER): Payer: Self-pay

## 2024-02-02 ENCOUNTER — Other Ambulatory Visit (HOSPITAL_BASED_OUTPATIENT_CLINIC_OR_DEPARTMENT_OTHER): Payer: Self-pay

## 2024-02-03 ENCOUNTER — Other Ambulatory Visit (HOSPITAL_BASED_OUTPATIENT_CLINIC_OR_DEPARTMENT_OTHER): Payer: Self-pay

## 2024-02-04 ENCOUNTER — Other Ambulatory Visit (HOSPITAL_BASED_OUTPATIENT_CLINIC_OR_DEPARTMENT_OTHER): Payer: Self-pay

## 2024-02-05 ENCOUNTER — Other Ambulatory Visit (HOSPITAL_BASED_OUTPATIENT_CLINIC_OR_DEPARTMENT_OTHER): Payer: Self-pay

## 2024-02-06 ENCOUNTER — Other Ambulatory Visit (HOSPITAL_BASED_OUTPATIENT_CLINIC_OR_DEPARTMENT_OTHER): Payer: Self-pay

## 2024-02-09 ENCOUNTER — Other Ambulatory Visit (HOSPITAL_BASED_OUTPATIENT_CLINIC_OR_DEPARTMENT_OTHER): Payer: Self-pay

## 2024-02-10 ENCOUNTER — Other Ambulatory Visit (HOSPITAL_BASED_OUTPATIENT_CLINIC_OR_DEPARTMENT_OTHER): Payer: Self-pay

## 2024-02-11 ENCOUNTER — Other Ambulatory Visit (HOSPITAL_BASED_OUTPATIENT_CLINIC_OR_DEPARTMENT_OTHER): Payer: Self-pay

## 2024-02-12 ENCOUNTER — Other Ambulatory Visit (HOSPITAL_BASED_OUTPATIENT_CLINIC_OR_DEPARTMENT_OTHER): Payer: Self-pay

## 2024-02-13 ENCOUNTER — Other Ambulatory Visit (HOSPITAL_BASED_OUTPATIENT_CLINIC_OR_DEPARTMENT_OTHER): Payer: Self-pay

## 2024-02-16 ENCOUNTER — Other Ambulatory Visit (HOSPITAL_BASED_OUTPATIENT_CLINIC_OR_DEPARTMENT_OTHER): Payer: Self-pay

## 2024-02-17 ENCOUNTER — Other Ambulatory Visit (HOSPITAL_BASED_OUTPATIENT_CLINIC_OR_DEPARTMENT_OTHER): Payer: Self-pay

## 2024-02-19 ENCOUNTER — Other Ambulatory Visit (HOSPITAL_BASED_OUTPATIENT_CLINIC_OR_DEPARTMENT_OTHER): Payer: Self-pay

## 2024-02-20 ENCOUNTER — Encounter: Payer: Self-pay | Admitting: Family Medicine

## 2024-02-20 ENCOUNTER — Other Ambulatory Visit (HOSPITAL_BASED_OUTPATIENT_CLINIC_OR_DEPARTMENT_OTHER): Payer: Self-pay

## 2024-02-20 MED ORDER — NURTEC 75 MG PO TBDP
75.0000 mg | ORAL_TABLET | Freq: Every day | ORAL | 2 refills | Status: AC | PRN
  Filled 2024-02-20: qty 16, 16d supply, fill #0
  Filled 2024-03-04: qty 8, 30d supply, fill #0

## 2024-02-27 ENCOUNTER — Telehealth: Payer: Self-pay

## 2024-02-27 ENCOUNTER — Other Ambulatory Visit (HOSPITAL_COMMUNITY): Payer: Self-pay

## 2024-02-27 ENCOUNTER — Other Ambulatory Visit (HOSPITAL_BASED_OUTPATIENT_CLINIC_OR_DEPARTMENT_OTHER): Payer: Self-pay

## 2024-02-27 NOTE — Telephone Encounter (Signed)
 Copied from CRM #[id number]. Topic: Clinical - Medication Prior Auth >> Feb 27, 2024 12:25 PM Franky GRADE wrote: Reason for CRM: Patient is calling because the pharmacy advised that the Rx #: [id number]  Rimegepant Sulfate  (NURTEC) 75 MG TBDP [[id number]]  Require prior Auth.

## 2024-02-27 NOTE — Telephone Encounter (Signed)
 Pharmacy Patient Advocate Encounter   Received notification from Pt Calls Messages that prior authorization for Nurtec 75mg  tabs is required/requested.   Insurance verification completed.   The patient is insured through [facility].   Per test claim: PA required; PA started via CoverMyMeds. KEY [id number] . Waiting for clinical questions to populate.

## 2024-03-01 NOTE — Telephone Encounter (Signed)
 Clinical questions answered and PA submitted.

## 2024-03-04 ENCOUNTER — Other Ambulatory Visit (HOSPITAL_BASED_OUTPATIENT_CLINIC_OR_DEPARTMENT_OTHER): Payer: Self-pay

## 2024-03-04 NOTE — Telephone Encounter (Signed)
 Pharmacy Patient Advocate Encounter  Received notification from [facility] that Prior Authorization for Nurtec 75mg  tabs has been APPROVED from 03/03/2024 to 03/02/2025.   PA #/Case ID/Reference #: [id number]

## 2024-03-05 ENCOUNTER — Other Ambulatory Visit (HOSPITAL_BASED_OUTPATIENT_CLINIC_OR_DEPARTMENT_OTHER): Payer: Self-pay

## 2024-03-05 NOTE — Telephone Encounter (Signed)
 Sent pt message

## 2024-03-23 ENCOUNTER — Encounter: Payer: Self-pay | Admitting: Family Medicine

## 2024-03-23 DIAGNOSIS — M20011 Mallet finger of right finger(s): Secondary | ICD-10-CM | POA: Diagnosis not present

## 2024-03-23 DIAGNOSIS — M79644 Pain in right finger(s): Secondary | ICD-10-CM | POA: Diagnosis not present

## 2024-03-23 NOTE — Telephone Encounter (Signed)
Called pt and  appt scheduled.

## 2024-03-24 ENCOUNTER — Encounter: Payer: Self-pay | Admitting: Family Medicine

## 2024-03-24 ENCOUNTER — Ambulatory Visit: Admitting: Family Medicine

## 2024-04-20 ENCOUNTER — Ambulatory Visit: Admitting: Family Medicine

## 2024-05-07 ENCOUNTER — Ambulatory Visit: Admitting: Family Medicine

## 2024-06-11 ENCOUNTER — Ambulatory Visit: Admitting: Family Medicine
